# Patient Record
Sex: Female | Born: 1960
Health system: Southern US, Community
[De-identification: ages and names within clinical notes are randomized; demographics above are authoritative.]

## PROBLEM LIST (undated history)

## (undated) DIAGNOSIS — K219 Gastro-esophageal reflux disease without esophagitis: Secondary | ICD-10-CM

## (undated) DIAGNOSIS — E119 Type 2 diabetes mellitus without complications: Secondary | ICD-10-CM

## (undated) DIAGNOSIS — G4730 Sleep apnea, unspecified: Secondary | ICD-10-CM

## (undated) DIAGNOSIS — E785 Hyperlipidemia, unspecified: Secondary | ICD-10-CM

## (undated) DIAGNOSIS — J40 Bronchitis, not specified as acute or chronic: Secondary | ICD-10-CM

## (undated) DIAGNOSIS — F419 Anxiety disorder, unspecified: Secondary | ICD-10-CM

## (undated) HISTORY — DX: Bronchitis, not specified as acute or chronic: J40

## (undated) HISTORY — DX: Sleep apnea, unspecified: G47.30

## (undated) HISTORY — DX: Hyperlipidemia, unspecified: E78.5

## (undated) HISTORY — PX: ROTATOR CUFF REPAIR: SHX139

## (undated) HISTORY — DX: Anxiety disorder, unspecified: F41.9

## (undated) HISTORY — PX: BREAST EXCISIONAL BIOPSY: SUR124

## (undated) HISTORY — PX: COLPOSCOPY W/ BIOPSY / CURETTAGE: SUR283

## (undated) HISTORY — DX: Type 2 diabetes mellitus without complications: E11.9

---

## 1997-10-20 HISTORY — PX: TUBAL LIGATION: SHX77

## 1997-12-04 ENCOUNTER — Ambulatory Visit (HOSPITAL_COMMUNITY): Admission: RE | Admit: 1997-12-04 | Discharge: 1997-12-04 | Payer: Self-pay | Admitting: *Deleted

## 1998-09-20 ENCOUNTER — Ambulatory Visit (HOSPITAL_BASED_OUTPATIENT_CLINIC_OR_DEPARTMENT_OTHER): Admission: RE | Admit: 1998-09-20 | Discharge: 1998-09-20 | Payer: Self-pay | Admitting: Orthopedic Surgery

## 1999-10-21 HISTORY — PX: CARPAL TUNNEL RELEASE: SHX101

## 2000-12-04 ENCOUNTER — Other Ambulatory Visit: Admission: RE | Admit: 2000-12-04 | Discharge: 2000-12-04 | Payer: Self-pay | Admitting: Orthopedic Surgery

## 2002-06-24 ENCOUNTER — Emergency Department (HOSPITAL_COMMUNITY): Admission: EM | Admit: 2002-06-24 | Discharge: 2002-06-24 | Payer: Self-pay | Admitting: Emergency Medicine

## 2002-11-09 ENCOUNTER — Emergency Department (HOSPITAL_COMMUNITY): Admission: EM | Admit: 2002-11-09 | Discharge: 2002-11-09 | Payer: Self-pay | Admitting: Emergency Medicine

## 2003-04-12 ENCOUNTER — Emergency Department (HOSPITAL_COMMUNITY): Admission: EM | Admit: 2003-04-12 | Discharge: 2003-04-12 | Payer: Self-pay | Admitting: Emergency Medicine

## 2003-05-04 ENCOUNTER — Ambulatory Visit (HOSPITAL_BASED_OUTPATIENT_CLINIC_OR_DEPARTMENT_OTHER): Admission: RE | Admit: 2003-05-04 | Discharge: 2003-05-04 | Payer: Self-pay | Admitting: Orthopedic Surgery

## 2006-06-04 DIAGNOSIS — E785 Hyperlipidemia, unspecified: Secondary | ICD-10-CM | POA: Insufficient documentation

## 2006-06-04 DIAGNOSIS — E119 Type 2 diabetes mellitus without complications: Secondary | ICD-10-CM | POA: Insufficient documentation

## 2006-06-04 DIAGNOSIS — G4730 Sleep apnea, unspecified: Secondary | ICD-10-CM | POA: Insufficient documentation

## 2006-10-20 HISTORY — PX: DILATION AND CURETTAGE OF UTERUS: SHX78

## 2007-03-19 ENCOUNTER — Emergency Department: Payer: Self-pay | Admitting: Emergency Medicine

## 2007-05-17 ENCOUNTER — Ambulatory Visit: Payer: Self-pay

## 2007-05-19 DIAGNOSIS — N259 Disorder resulting from impaired renal tubular function, unspecified: Secondary | ICD-10-CM | POA: Insufficient documentation

## 2007-05-20 ENCOUNTER — Ambulatory Visit: Payer: Self-pay

## 2007-06-09 DIAGNOSIS — F17200 Nicotine dependence, unspecified, uncomplicated: Secondary | ICD-10-CM | POA: Insufficient documentation

## 2007-06-09 DIAGNOSIS — G4733 Obstructive sleep apnea (adult) (pediatric): Secondary | ICD-10-CM | POA: Insufficient documentation

## 2007-06-09 DIAGNOSIS — E7800 Pure hypercholesterolemia, unspecified: Secondary | ICD-10-CM | POA: Insufficient documentation

## 2007-11-13 ENCOUNTER — Emergency Department: Payer: Self-pay | Admitting: Emergency Medicine

## 2009-12-18 ENCOUNTER — Encounter: Payer: Self-pay | Admitting: Orthopedic Surgery

## 2010-05-03 DIAGNOSIS — E559 Vitamin D deficiency, unspecified: Secondary | ICD-10-CM | POA: Insufficient documentation

## 2011-07-14 ENCOUNTER — Emergency Department: Payer: Self-pay | Admitting: *Deleted

## 2011-09-25 ENCOUNTER — Emergency Department: Payer: Self-pay | Admitting: Emergency Medicine

## 2011-10-24 ENCOUNTER — Ambulatory Visit: Payer: Self-pay | Admitting: Urology

## 2011-11-11 ENCOUNTER — Ambulatory Visit: Payer: Self-pay | Admitting: Internal Medicine

## 2013-07-07 ENCOUNTER — Ambulatory Visit: Payer: Self-pay | Admitting: Otolaryngology

## 2013-08-31 ENCOUNTER — Other Ambulatory Visit: Payer: Self-pay | Admitting: Orthopedic Surgery

## 2013-08-31 DIAGNOSIS — IMO0002 Reserved for concepts with insufficient information to code with codable children: Secondary | ICD-10-CM

## 2013-09-05 ENCOUNTER — Ambulatory Visit
Admission: RE | Admit: 2013-09-05 | Discharge: 2013-09-05 | Disposition: A | Discharge status: Home / Self Care | Payer: BC Managed Care – PPO | Source: Ambulatory Visit | Attending: Orthopedic Surgery | Admitting: Orthopedic Surgery

## 2013-09-05 DIAGNOSIS — IMO0002 Reserved for concepts with insufficient information to code with codable children: Secondary | ICD-10-CM

## 2014-01-30 DIAGNOSIS — E669 Obesity, unspecified: Secondary | ICD-10-CM | POA: Insufficient documentation

## 2014-01-30 DIAGNOSIS — IMO0001 Reserved for inherently not codable concepts without codable children: Secondary | ICD-10-CM | POA: Insufficient documentation

## 2014-01-30 DIAGNOSIS — F17200 Nicotine dependence, unspecified, uncomplicated: Secondary | ICD-10-CM | POA: Insufficient documentation

## 2014-06-23 LAB — TSH: TSH: 0.71 u[IU]/mL (ref 0.41–5.90)

## 2014-06-23 LAB — BASIC METABOLIC PANEL
BUN: 5 mg/dL (ref 4–21)
CREATININE: 0.8 mg/dL (ref 0.5–1.1)
GLUCOSE: 129 mg/dL
Potassium: 4.9 mmol/L (ref 3.4–5.3)
Sodium: 143 mmol/L (ref 137–147)

## 2014-06-23 LAB — LIPID PANEL
CHOLESTEROL: 209 mg/dL — AB (ref 0–200)
HDL: 30 mg/dL — AB (ref 35–70)
LDL Cholesterol: 111 mg/dL
Triglycerides: 340 mg/dL — AB (ref 40–160)

## 2014-06-23 LAB — CBC AND DIFFERENTIAL
HCT: 42 % (ref 36–46)
HEMOGLOBIN: 14 g/dL (ref 12.0–16.0)
WBC: 7.3 10^3/mL

## 2014-06-23 LAB — HEPATIC FUNCTION PANEL
ALT: 9 U/L (ref 7–35)
AST: 14 U/L (ref 13–35)

## 2014-06-23 LAB — HEMOGLOBIN A1C: Hemoglobin A1C: 8.7

## 2014-10-20 HISTORY — PX: BREAST BIOPSY: SHX20

## 2014-10-26 LAB — HM PAP SMEAR: HM PAP: NORMAL

## 2014-11-06 ENCOUNTER — Ambulatory Visit: Payer: Self-pay | Admitting: Family Medicine

## 2014-11-14 ENCOUNTER — Ambulatory Visit: Payer: Self-pay | Admitting: Family Medicine

## 2014-11-20 ENCOUNTER — Ambulatory Visit: Payer: Self-pay | Admitting: Family Medicine

## 2015-02-12 LAB — SURGICAL PATHOLOGY

## 2015-05-13 ENCOUNTER — Other Ambulatory Visit: Payer: Self-pay | Admitting: Family Medicine

## 2015-05-13 DIAGNOSIS — E119 Type 2 diabetes mellitus without complications: Secondary | ICD-10-CM

## 2015-05-14 DIAGNOSIS — E119 Type 2 diabetes mellitus without complications: Secondary | ICD-10-CM | POA: Insufficient documentation

## 2015-05-14 NOTE — Telephone Encounter (Signed)
Last OV 10/2014  Thanks,   -Dawanna Grauberger  

## 2015-06-28 ENCOUNTER — Other Ambulatory Visit: Payer: Self-pay | Admitting: Family Medicine

## 2015-06-28 DIAGNOSIS — E119 Type 2 diabetes mellitus without complications: Secondary | ICD-10-CM

## 2015-06-29 NOTE — Telephone Encounter (Signed)
Last ov was on 10/26/2014. Januvia was refilled on 10/23/2014 and glipizide was refilled on 08/28/14.  Thanks,

## 2015-10-14 IMAGING — US US BIOPSY BREAST CORE W/ IMAGING
1 series · 12 of 12 positions shown · non-contrast
Comparison: Previous exam(s).

ADDENDUM:
Pathology of the left breast biopsy at 6 o'clock superficial
revealed PORTIONS OF SCLEROTIC / HYALINIZED FIBROADENOMA. This was
found to be concordant by Dr. Adlaho.

At the patient's request, pathology was relayed to the patient by
phone. She stated she has some soreness but has had no bleeding,
bruising, or hematoma at the biopsy site. Post biopsy instructions
were reviewed with the patient. She was encouraged to call the
[REDACTED] with any
further questions or concerns. The patient is to return in October 2015 for a bilateral screening mammogram.
Pathology relayed by Rosa Mesa, Denisyaredy on 11/24/14 at [id number].
CLINICAL DATA: Patient with indeterminate left breast mass, 6
o'clock position, superficial.
EXAM:
ULTRASOUND GUIDED LEFT BREAST CORE NEEDLE BIOPSY

[Series 1: us biopsy breast core w/ imaging · 0.08mm/px · 12 of 12 slices shown]
[im 1/12]
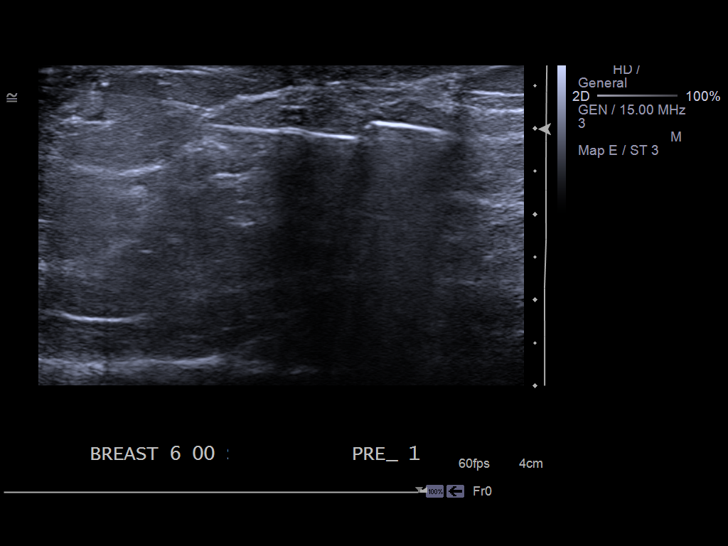
[im 2/12]
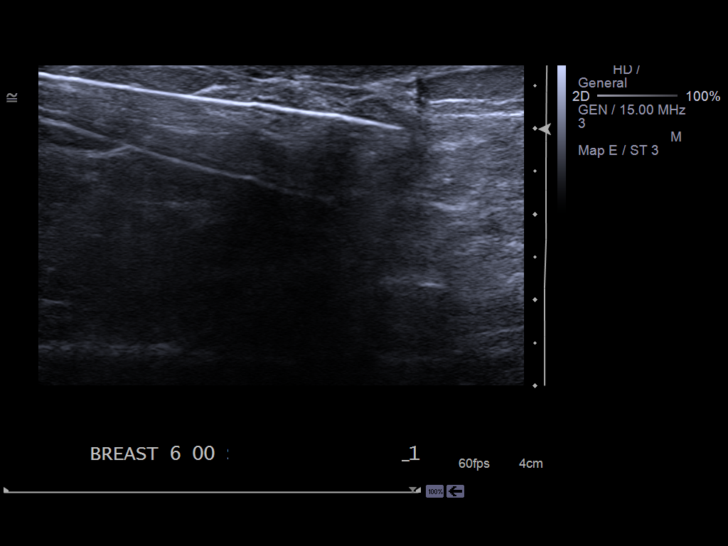
[im 3/12]
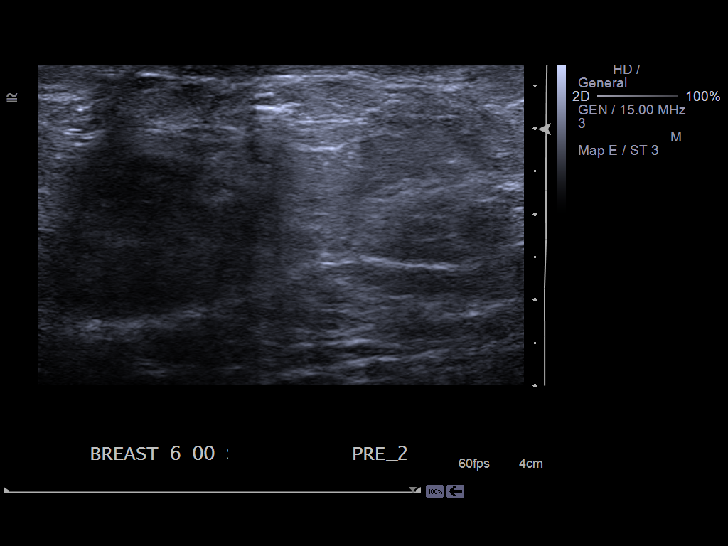
[im 4/12]
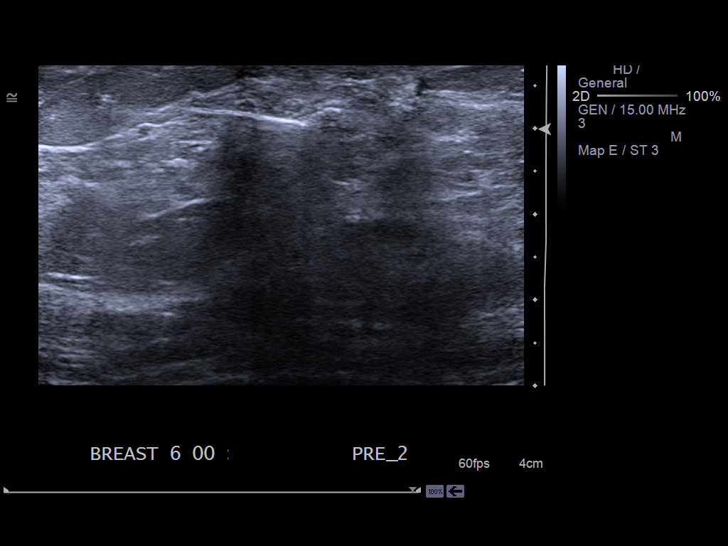
[im 5/12]
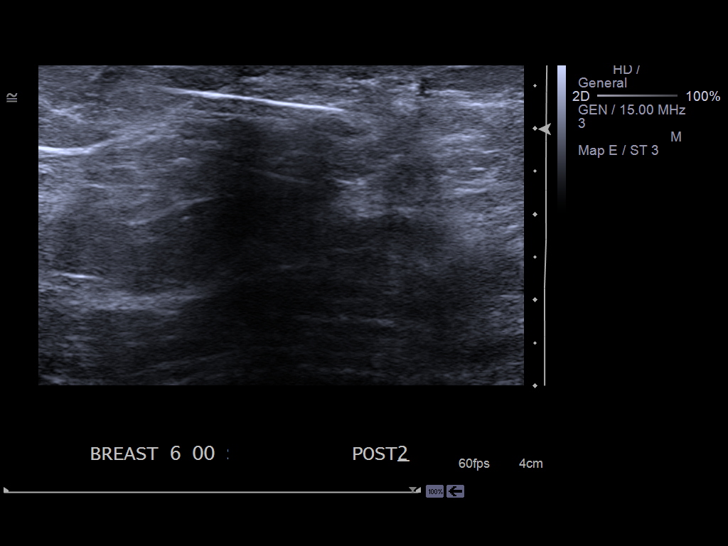
[im 6/12]
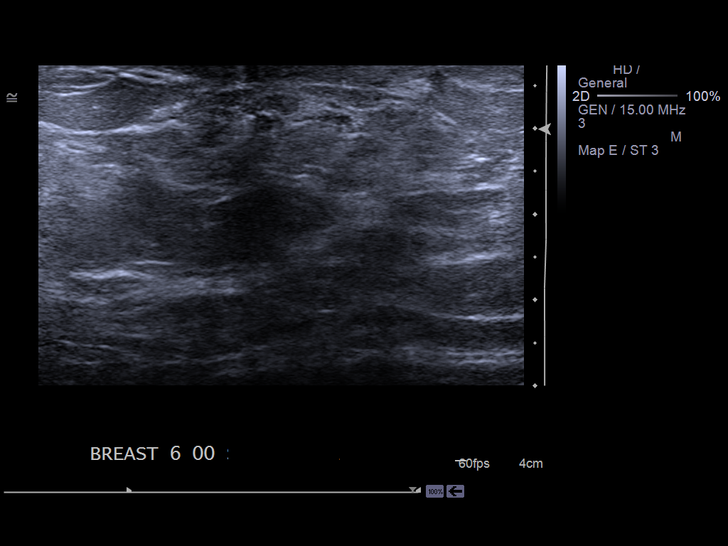
[im 7/12]
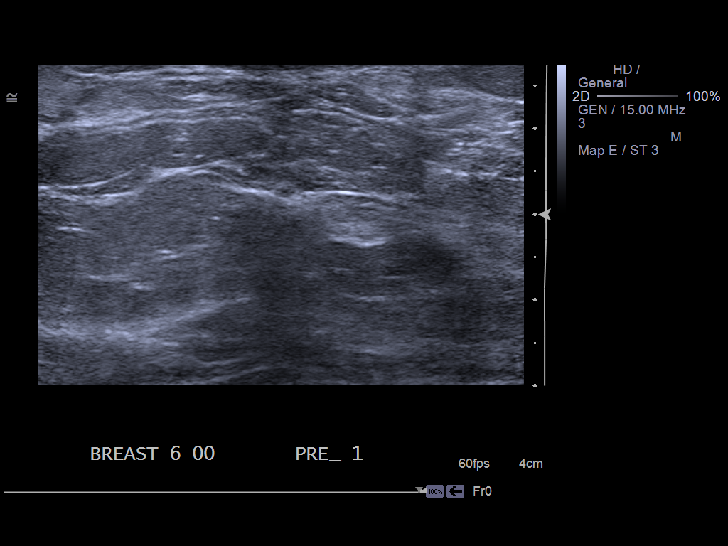
[im 8/12]
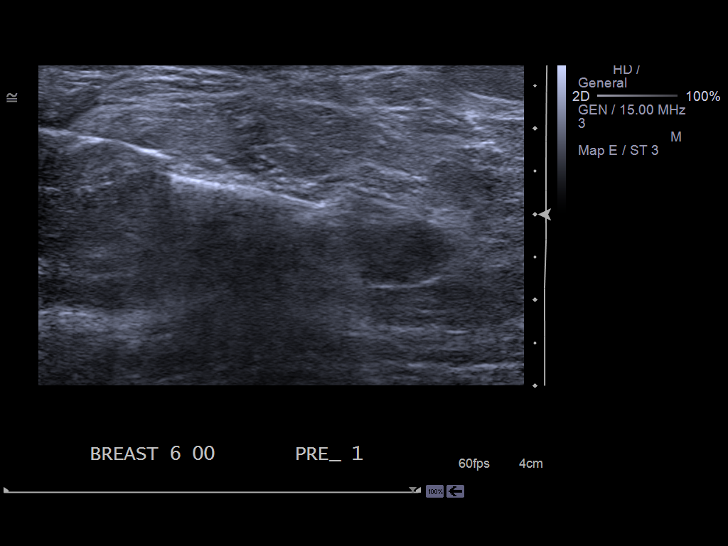
[im 9/12]
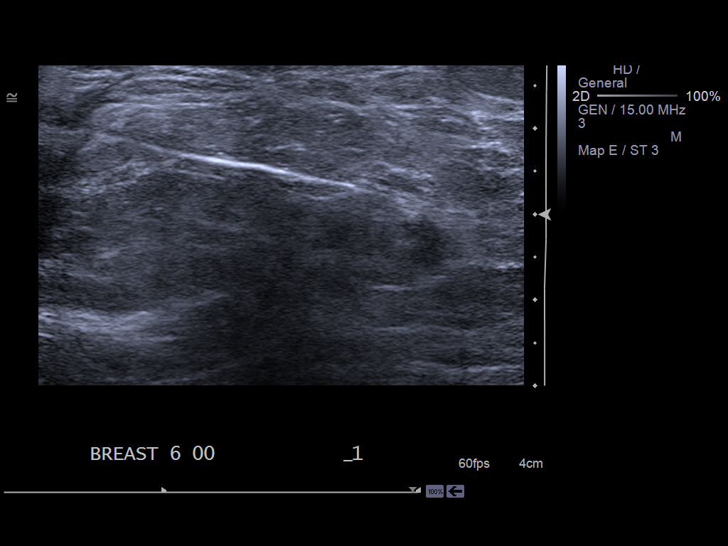
[im 10/12]
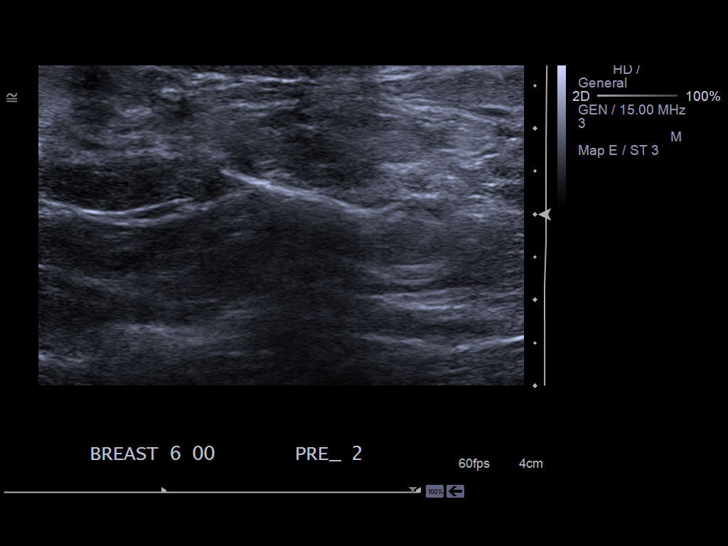
[im 11/12]
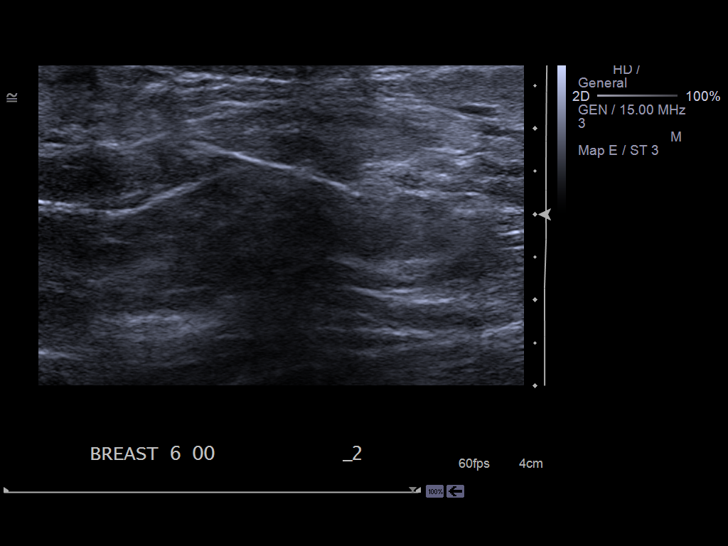
[im 12/12]
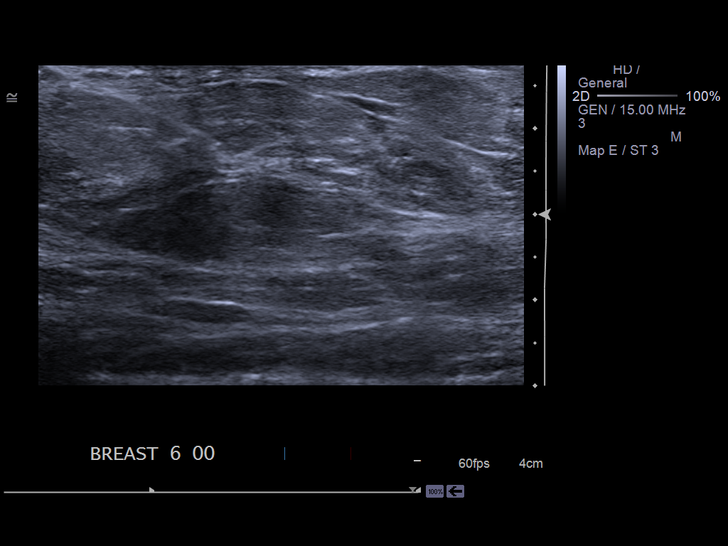

[12 of 12 positions shown; findings below may reference images not displayed]

PROCEDURE:
I met with the patient and we discussed the procedure of
ultrasound-guided biopsy, including benefits and alternatives. We
discussed the high likelihood of a successful procedure. We
discussed the risks of the procedure including infection, bleeding,
tissue injury, clip migration, and inadequate sampling. Informed
written consent was given. The usual time-out protocol was performed
immediately prior to the procedure.

Using sterile technique and 2% Lidocaine as local anesthetic, under
direct ultrasound visualization, a 12 gauge vacuum-assisted device
was used to perform biopsy of hypoechoic left breast mass 6 o'clock
position, superficialusing a lateral approach. At the conclusion of
the procedure, a coil shaped tissue marker clip was deployed into
the biopsy cavity. Follow-up 2-view mammogram was performed and
dictated separately.
IMPRESSION: Ultrasound-guided biopsy of left breast mass, superficial, #1. No
apparent complications.

## 2015-11-09 DIAGNOSIS — F419 Anxiety disorder, unspecified: Secondary | ICD-10-CM | POA: Insufficient documentation

## 2015-11-09 DIAGNOSIS — E1165 Type 2 diabetes mellitus with hyperglycemia: Secondary | ICD-10-CM | POA: Insufficient documentation

## 2015-11-09 DIAGNOSIS — R9431 Abnormal electrocardiogram [ECG] [EKG]: Secondary | ICD-10-CM | POA: Insufficient documentation

## 2015-11-09 DIAGNOSIS — R935 Abnormal findings on diagnostic imaging of other abdominal regions, including retroperitoneum: Secondary | ICD-10-CM | POA: Insufficient documentation

## 2015-11-12 ENCOUNTER — Encounter: Payer: Self-pay | Admitting: Physician Assistant

## 2015-11-12 ENCOUNTER — Ambulatory Visit (INDEPENDENT_AMBULATORY_CARE_PROVIDER_SITE_OTHER): Payer: Federal, State, Local not specified - PPO | Admitting: Physician Assistant

## 2015-11-12 VITALS — BP 134/70 | HR 66 | Temp 98.3°F | Resp 16 | Wt 166.0 lb

## 2015-11-12 DIAGNOSIS — E1165 Type 2 diabetes mellitus with hyperglycemia: Secondary | ICD-10-CM

## 2015-11-12 DIAGNOSIS — N259 Disorder resulting from impaired renal tubular function, unspecified: Secondary | ICD-10-CM | POA: Diagnosis not present

## 2015-11-12 DIAGNOSIS — I10 Essential (primary) hypertension: Secondary | ICD-10-CM | POA: Insufficient documentation

## 2015-11-12 DIAGNOSIS — E7800 Pure hypercholesterolemia, unspecified: Secondary | ICD-10-CM

## 2015-11-12 DIAGNOSIS — E119 Type 2 diabetes mellitus without complications: Secondary | ICD-10-CM | POA: Diagnosis not present

## 2015-11-12 LAB — POCT UA - MICROALBUMIN: MICROALBUMIN (UR) POC: 20 mg/L

## 2015-11-12 LAB — POCT GLYCOSYLATED HEMOGLOBIN (HGB A1C)
Est. average glucose Bld gHb Est-mCnc: 200
Hemoglobin A1C: 8.6

## 2015-11-12 NOTE — Patient Instructions (Signed)
Diabetes Mellitus and Food It is important for you to manage your blood sugar (glucose) level. Your blood glucose level can be greatly affected by what you eat. Eating healthier foods in the appropriate amounts throughout the day at about the same time each day will help you control your blood glucose level. It can also help slow or prevent worsening of your diabetes mellitus. Healthy eating may even help you improve the level of your blood pressure and reach or maintain a healthy weight.  General recommendations for healthful eating and cooking habits include:  Eating meals and snacks regularly. Avoid going long periods of time without eating to lose weight.  Eating a diet that consists mainly of plant-based foods, such as fruits, vegetables, nuts, legumes, and whole grains.  Using low-heat cooking methods, such as baking, instead of high-heat cooking methods, such as deep frying. Work with your dietitian to make sure you understand how to use the Nutrition Facts information on food labels. HOW CAN FOOD AFFECT ME? Carbohydrates Carbohydrates affect your blood glucose level more than any other type of food. Your dietitian will help you determine how many carbohydrates to eat at each meal and teach you how to count carbohydrates. Counting carbohydrates is important to keep your blood glucose at a healthy level, especially if you are using insulin or taking certain medicines for diabetes mellitus. Alcohol Alcohol can cause sudden decreases in blood glucose (hypoglycemia), especially if you use insulin or take certain medicines for diabetes mellitus. Hypoglycemia can be a life-threatening condition. Symptoms of hypoglycemia (sleepiness, dizziness, and disorientation) are similar to symptoms of having too much alcohol.  If your health care provider has given you approval to drink alcohol, do so in moderation and use the following guidelines:  Women should not have more than one drink per day, and men  should not have more than two drinks per day. One drink is equal to:  12 oz of beer.  5 oz of wine.  1 oz of hard liquor.  Do not drink on an empty stomach.  Keep yourself hydrated. Have water, diet soda, or unsweetened iced tea.  Regular soda, juice, and other mixers might contain a lot of carbohydrates and should be counted. WHAT FOODS ARE NOT RECOMMENDED? As you make food choices, it is important to remember that all foods are not the same. Some foods have fewer nutrients per serving than other foods, even though they might have the same number of calories or carbohydrates. It is difficult to get your body what it needs when you eat foods with fewer nutrients. Examples of foods that you should avoid that are high in calories and carbohydrates but low in nutrients include:  Trans fats (most processed foods list trans fats on the Nutrition Facts label).  Regular soda.  Juice.  Candy.  Sweets, such as cake, pie, doughnuts, and cookies.  Fried foods. WHAT FOODS CAN I EAT? Eat nutrient-rich foods, which will nourish your body and keep you healthy. The food you should eat also will depend on several factors, including:  The calories you need.  The medicines you take.  Your weight.  Your blood glucose level.  Your blood pressure level.  Your cholesterol level. You should eat a variety of foods, including:  Protein.  Lean cuts of meat.  Proteins low in saturated fats, such as fish, egg whites, and beans. Avoid processed meats.  Fruits and vegetables.  Fruits and vegetables that may help control blood glucose levels, such as apples, mangoes, and   yams.  Dairy products.  Choose fat-free or low-fat dairy products, such as milk, yogurt, and cheese.  Grains, bread, pasta, and rice.  Choose whole grain products, such as multigrain bread, whole oats, and brown rice. These foods may help control blood pressure.  Fats.  Foods containing healthful fats, such as nuts,  avocado, olive oil, canola oil, and fish. DOES EVERYONE WITH DIABETES MELLITUS HAVE THE SAME MEAL PLAN? Because every person with diabetes mellitus is different, there is not one meal plan that works for everyone. It is very important that you meet with a dietitian who will help you create a meal plan that is just right for you.   This information is not intended to replace advice given to you by your health care provider. Make sure you discuss any questions you have with your health care provider.   Document Released: 07/03/2005 Document Revised: 10/27/2014 Document Reviewed: 09/02/2013 Elsevier Interactive Patient Education 2016 Elsevier Inc.  

## 2015-11-12 NOTE — Progress Notes (Signed)
Patient: Jacqueline Sparks Female    DOB: [birth year]   55 y.o.   MRN: [id number] Visit Date: 11/12/2015  Today's Provider: Mar Daring, PA-C   Chief Complaint  Patient presents with  . Diabetes   Subjective:    HPI  Diabetes Mellitus Type II, Follow-up:   Lab Results  Component Value Date   HGBA1C 8.7 06/23/2014    Last seen for diabetes 1 years ago.  Management since then includes none. She reports fair compliance with treatment. She is not having side effects.  Current symptoms include polydipsia, polyuria and visual disturbances and have been unchanged. Home blood sugar records:Generally she checks while fasting and ranges are between 100-120.  Episodes of hypoglycemia? No, when she first started the [place] it did.   Current Insulin Regimen: Actos,Farxiga,Glipizide,Januvia. Patient saw the Endocrinologist, 05/2015 and was put on the Farxiga. Most Recent Eye Exam: Per patient was 3 years ago. Weight trend: stable Prior visit with dietician: yes - it has been a while. Current diet: in general, a "healthy" diet   Current exercise: housecleaning and walking  Pertinent Labs:    Component Value Date/Time   CHOL 209* 06/23/2014   TRIG 340* 06/23/2014   HDL 30* 06/23/2014   LDLCALC 111 06/23/2014   CREATININE 0.8 06/23/2014    Wt Readings from Last 3 Encounters:  11/12/15 166 lb (75.297 kg)  10/26/14 169 lb (76.658 kg)    ------------------------------------------------------------------------      No Known Allergies Previous Medications   ALPRAZOLAM (XANAX) 0.5 MG TABLET    Take by mouth.   ATORVASTATIN (LIPITOR) 10 MG TABLET    Take by mouth.   CLOBETASOL OINTMENT (TEMOVATE) 0.05 %       DAPAGLIFLOZIN PROPANEDIOL (FARXIGA) 10 MG TABS TABLET    Take by mouth.   GLIPIZIDE (GLUCOTROL) 10 MG TABLET    TAKE ONE TABLET BY MOUTH TWICE DAILY 30 MINUTES BEFORE A MEAL   JANUVIA 100 MG TABLET    TAKE ONE TABLET BY MOUTH ONCE DAILY   NON FORMULARY    CPAP    ONE TOUCH ULTRA TEST TEST STRIP       PIOGLITAZONE (ACTOS) 45 MG TABLET    TAKE ONE TABLET BY MOUTH ONCE DAILY    Review of Systems  Constitutional: Negative for fatigue and unexpected weight change.  HENT: Negative.   Eyes: Positive for visual disturbance.  Respiratory: Negative.   Cardiovascular: Negative for chest pain, palpitations and leg swelling.  Gastrointestinal: Negative.   Endocrine: Positive for polydipsia (feel it comes from the Pepsi she drinks.).  Genitourinary: Negative for urgency and frequency.  Musculoskeletal: Negative.   Allergic/Immunologic: Negative.   Neurological: Negative for dizziness, light-headedness and headaches.  Hematological: Negative.   Psychiatric/Behavioral: Negative.     Social History  Substance Use Topics  . Smoking status: Current Every Day Smoker -- 1.00 packs/day    Types: Cigarettes  . Smokeless tobacco: Never Used  . Alcohol Use: No   Objective:   BP 134/70 mmHg  Pulse 66  Temp(Src) 98.3 F (36.8 C) (Oral)  Resp 16  Wt 166 lb (75.297 kg)  Physical Exam  Constitutional: She is oriented to person, place, and time. She appears well-developed and well-nourished. No distress.  HENT:  Head: Normocephalic and atraumatic.  Right Ear: External ear normal.  Left Ear: External ear normal.  Nose: Nose normal.  Mouth/Throat: Oropharynx is clear and moist. No oropharyngeal exudate.  Eyes: Conjunctivae and EOM are normal. Pupils are  equal, round, and reactive to light. Right eye exhibits no discharge. Left eye exhibits no discharge. No scleral icterus.  Neck: Normal range of motion. Neck supple. No JVD present. No tracheal deviation present. No thyromegaly present.  Cardiovascular: Normal rate, regular rhythm, normal heart sounds and intact distal pulses.  Exam reveals no gallop and no friction rub.   No murmur heard. Pulmonary/Chest: Effort normal and breath sounds normal. No respiratory distress. She has no wheezes. She has no rales.  She exhibits no tenderness.  Abdominal: Soft. Bowel sounds are normal. She exhibits no distension and no mass. There is no tenderness. There is no rebound and no guarding.  Musculoskeletal: Normal range of motion. She exhibits no edema or tenderness.  Lymphadenopathy:    She has no cervical adenopathy.  Neurological: She is alert and oriented to person, place, and time.  Skin: Skin is warm and dry. No rash noted. She is not diaphoretic.  Psychiatric: She has a normal mood and affect. Her behavior is normal. Judgment and thought content normal.  Vitals reviewed.       Assessment & Plan:     1. Type 2 diabetes mellitus with hyperglycemia, unspecified long term insulin use status (HCC) Microalbumin was 20 today and hemoglobin A1c was 8.6 which both are stable. She is currently stable on Farxiga 10 mg, glipizide 10 mg, Januvia 100 mg, and Actos 45 mg. She has recently seen an endocrinologist in August 2016 and she cannot remember if she was scheduled to follow-up or not. Foot exam today was within normal limits. She does also follow dermatology for psoriasis on her feet and hands. I did also advise her to follow-up with her ophthalmologist as she has not seen him in approximately 2-3 years. She is having visual changes and states that increased blurry vision. I will also be checking labs as below. I will follow-up with her pending these lab results. I did also discuss with her about trying to cut back on the Pepsi: That she drinks. She states she drinks 1 L of Pepsi-Cola daily. We did discuss that if she could discontinue drinking Pepsi-Cola that she may be able to come off some of her diabetic medications. If labs are stable and within normal limits I will see her back in 3 months to reevaluate. She is to call the office in the meantime if she develops any acute issues, questions or concerns. - POCT UA - Microalbumin - POCT glycosylated hemoglobin (Hb A1C) - CBC With Differential - Comprehensive  Metabolic Panel (CMET) - TSH  2. Renal tubular disorder History of this. I will check complete metabolic panel as below to see what her kidney function is currently. I will follow-up with her pending lab results. - Comprehensive Metabolic Panel (CMET)  4. Hypercholesteremia Currently stable on atorvastatin 10 mg. I will check cholesterol as below and follow-up pending results. In the meantime she is to continue her current medical treatment of atorvastatin 10 mg daily. - Lipid Profile       Mar Daring, PA-C  [facility]

## 2015-11-20 LAB — CBC WITH DIFFERENTIAL
BASOS: 0 %
Basophils Absolute: 0 10*3/uL (ref 0.0–0.2)
EOS (ABSOLUTE): 0.2 10*3/uL (ref 0.0–0.4)
EOS: 2 %
HEMATOCRIT: 40.6 % (ref 34.0–46.6)
Hemoglobin: 13.7 g/dL (ref 11.1–15.9)
IMMATURE GRANS (ABS): 0 10*3/uL (ref 0.0–0.1)
Immature Granulocytes: 0 %
LYMPHS: 47 %
Lymphocytes Absolute: 4.5 10*3/uL — ABNORMAL HIGH (ref 0.7–3.1)
MCH: 27.8 pg (ref 26.6–33.0)
MCHC: 33.7 g/dL (ref 31.5–35.7)
MCV: 82 fL (ref 79–97)
MONOCYTES: 6 %
Monocytes Absolute: 0.5 10*3/uL (ref 0.1–0.9)
NEUTROS PCT: 45 %
Neutrophils Absolute: 4.3 10*3/uL (ref 1.4–7.0)
RBC: 4.93 x10E6/uL (ref 3.77–5.28)
RDW: 15.2 % (ref 12.3–15.4)
WBC: 9.6 10*3/uL (ref 3.4–10.8)

## 2015-11-20 LAB — COMPREHENSIVE METABOLIC PANEL
A/G RATIO: 1.4 (ref 1.1–2.5)
ALT: 10 IU/L (ref 0–32)
AST: 15 IU/L (ref 0–40)
Albumin: 4.2 g/dL (ref 3.5–5.5)
Alkaline Phosphatase: 85 IU/L (ref 39–117)
BUN/Creatinine Ratio: 5 — ABNORMAL LOW (ref 9–23)
BUN: 4 mg/dL — ABNORMAL LOW (ref 6–24)
Bilirubin Total: <0.2 mg/dL (ref 0.0–1.2)
CALCIUM: 9.6 mg/dL (ref 8.7–10.2)
CO2: 25 mmol/L (ref 18–29)
Chloride: 102 mmol/L (ref 96–106)
Creatinine, Ser: 0.81 mg/dL (ref 0.57–1.00)
GFR, EST AFRICAN AMERICAN: 97 mL/min/{1.73_m2} (ref >59)
GFR, EST NON AFRICAN AMERICAN: 84 mL/min/{1.73_m2} (ref >59)
GLOBULIN, TOTAL: 3.1 g/dL (ref 1.5–4.5)
Glucose: 113 mg/dL — ABNORMAL HIGH (ref 65–99)
POTASSIUM: 4.9 mmol/L (ref 3.5–5.2)
Sodium: 146 mmol/L — ABNORMAL HIGH (ref 134–144)
TOTAL PROTEIN: 7.3 g/dL (ref 6.0–8.5)

## 2015-11-20 LAB — LIPID PANEL
CHOL/HDL RATIO: 8.7 ratio — AB (ref 0.0–4.4)
Cholesterol, Total: 253 mg/dL — ABNORMAL HIGH (ref 100–199)
HDL: 29 mg/dL — AB (ref >39)
Triglycerides: 469 mg/dL — ABNORMAL HIGH (ref 0–149)

## 2015-11-20 LAB — TSH: TSH: 1.19 u[IU]/mL (ref 0.450–4.500)

## 2015-11-20 MED ORDER — ATORVASTATIN CALCIUM 20 MG PO TABS: 20.0000 mg | ORAL_TABLET | Freq: Every day | ORAL | Status: DC

## 2015-11-20 NOTE — Addendum Note (Signed)
Addended by: Mar Daring on: 11/20/2015 09:22 AM   Modules accepted: Orders

## 2015-12-03 ENCOUNTER — Other Ambulatory Visit: Payer: Self-pay | Admitting: Family Medicine

## 2015-12-03 DIAGNOSIS — F419 Anxiety disorder, unspecified: Secondary | ICD-10-CM

## 2015-12-03 NOTE — Telephone Encounter (Signed)
Prescription  For Alprazolam 0.5 was called to [facility]  Thanks,  -Joseline

## 2016-02-04 DIAGNOSIS — H01003 Unspecified blepharitis right eye, unspecified eyelid: Secondary | ICD-10-CM | POA: Diagnosis not present

## 2016-02-11 ENCOUNTER — Encounter: Payer: Self-pay | Admitting: Physician Assistant

## 2016-02-11 ENCOUNTER — Ambulatory Visit (INDEPENDENT_AMBULATORY_CARE_PROVIDER_SITE_OTHER): Payer: Federal, State, Local not specified - PPO | Admitting: Physician Assistant

## 2016-02-11 ENCOUNTER — Other Ambulatory Visit: Payer: Self-pay | Admitting: Family Medicine

## 2016-02-11 VITALS — BP 150/88 | HR 77 | Temp 98.3°F | Resp 16 | Wt 169.2 lb

## 2016-02-11 DIAGNOSIS — E119 Type 2 diabetes mellitus without complications: Secondary | ICD-10-CM | POA: Diagnosis not present

## 2016-02-11 DIAGNOSIS — M7542 Impingement syndrome of left shoulder: Secondary | ICD-10-CM | POA: Diagnosis not present

## 2016-02-11 DIAGNOSIS — R030 Elevated blood-pressure reading, without diagnosis of hypertension: Secondary | ICD-10-CM

## 2016-02-11 DIAGNOSIS — H1089 Other conjunctivitis: Secondary | ICD-10-CM

## 2016-02-11 DIAGNOSIS — M7582 Other shoulder lesions, left shoulder: Secondary | ICD-10-CM | POA: Diagnosis not present

## 2016-02-11 DIAGNOSIS — A499 Bacterial infection, unspecified: Secondary | ICD-10-CM | POA: Diagnosis not present

## 2016-02-11 DIAGNOSIS — M7522 Bicipital tendinitis, left shoulder: Secondary | ICD-10-CM

## 2016-02-11 DIAGNOSIS — H109 Unspecified conjunctivitis: Secondary | ICD-10-CM

## 2016-02-11 DIAGNOSIS — IMO0001 Reserved for inherently not codable concepts without codable children: Secondary | ICD-10-CM

## 2016-02-11 LAB — POCT GLYCOSYLATED HEMOGLOBIN (HGB A1C): HEMOGLOBIN A1C: 8.6

## 2016-02-11 MED ORDER — NEOMYCIN-POLYMYXIN-HC 3.5-10000-1 OP SUSP: 4.0000 [drp] | Freq: Three times a day (TID) | OPHTHALMIC | Status: DC

## 2016-02-11 NOTE — Patient Instructions (Signed)
Basic Carbohydrate Counting for Diabetes Mellitus Carbohydrate counting is a method for keeping track of the amount of carbohydrates you eat. Eating carbohydrates naturally increases the level of sugar (glucose) in your blood, so it is important for you to know the amount that is okay for you to have in every meal. Carbohydrate counting helps keep the level of glucose in your blood within normal limits. The amount of carbohydrates allowed is different for every person. A dietitian can help you calculate the amount that is right for you. Once you know the amount of carbohydrates you can have, you can count the carbohydrates in the foods you want to eat. Carbohydrates are found in the following foods:  Grains, such as breads and cereals.  Dried beans and soy products.  Starchy vegetables, such as potatoes, peas, and corn.  Fruit and fruit juices.  Milk and yogurt.  Sweets and snack foods, such as cake, cookies, candy, chips, soft drinks, and fruit drinks. CARBOHYDRATE COUNTING There are two ways to count the carbohydrates in your food. You can use either of the methods or a combination of both. Reading the "Nutrition Facts" on Packaged Food The "Nutrition Facts" is an area that is included on the labels of almost all packaged food and beverages in the [place]. It includes the serving size of that food or beverage and information about the nutrients in each serving of the food, including the grams (g) of carbohydrate per serving.  Decide the number of servings of this food or beverage that you will be able to eat or drink. Multiply that number of servings by the number of grams of carbohydrate that is listed on the label for that serving. The total will be the amount of carbohydrates you will be having when you eat or drink this food or beverage. Learning Standard Serving Sizes of Food When you eat food that is not packaged or does not include "Nutrition Facts" on the label, you need to  measure the servings in order to count the amount of carbohydrates.A serving of most carbohydrate-rich foods contains about 15 g of carbohydrates. The following list includes serving sizes of carbohydrate-rich foods that provide 15 g ofcarbohydrate per serving:   1 slice of bread (1 oz) or 1 six-inch tortilla.    of a hamburger bun or English muffin.  4-6 crackers.   cup unsweetened dry cereal.    cup hot cereal.   cup rice or pasta.    cup mashed potatoes or  of a large baked potato.  1 cup fresh fruit or one small piece of fruit.    cup canned or frozen fruit or fruit juice.  1 cup milk.   cup plain fat-free yogurt or yogurt sweetened with artificial sweeteners.   cup cooked dried beans or starchy vegetable, such as peas, corn, or potatoes.  Decide the number of standard-size servings that you will eat. Multiply that number of servings by 15 (the grams of carbohydrates in that serving). For example, if you eat 2 cups of strawberries, you will have eaten 2 servings and 30 g of carbohydrates (2 servings x 15 g = 30 g). For foods such as soups and casseroles, in which more than one food is mixed in, you will need to count the carbohydrates in each food that is included. EXAMPLE OF CARBOHYDRATE COUNTING Sample Dinner  3 oz chicken breast.   cup of brown rice.   cup of corn.  1 cup milk.   1 cup strawberries with   sugar-free whipped topping.  Carbohydrate Calculation Step 1: Identify the foods that contain carbohydrates:   Rice.   Corn.   Milk.   Strawberries. Step 2:Calculate the number of servings eaten of each:   2 servings of rice.   1 serving of corn.   1 serving of milk.   1 serving of strawberries. Step 3: Multiply each of those number of servings by 15 g:   2 servings of rice x 15 g = 30 g.   1 serving of corn x 15 g = 15 g.   1 serving of milk x 15 g = 15 g.   1 serving of strawberries x 15 g = 15 g. Step 4: Add  together all of the amounts to find the total grams of carbohydrates eaten: 30 g + 15 g + 15 g + 15 g = 75 g.   This information is not intended to replace advice given to you by your health care provider. Make sure you discuss any questions you have with your health care provider.   Document Released: 10/06/2005 Document Revised: 10/27/2014 Document Reviewed: 09/02/2013 Elsevier Interactive Patient Education 2016 Elsevier Inc. Bacterial Conjunctivitis Bacterial conjunctivitis, commonly called pink eye, is an inflammation of the clear membrane that covers the white part of the eye (conjunctiva). The inflammation can also happen on the underside of the eyelids. The blood vessels in the conjunctiva become inflamed, causing the eye to become red or pink. Bacterial conjunctivitis may spread easily from one eye to another and from person to person (contagious).  CAUSES  Bacterial conjunctivitis is caused by bacteria. The bacteria may come from your own skin, your upper respiratory tract, or from someone else with bacterial conjunctivitis. SYMPTOMS  The normally white color of the eye or the underside of the eyelid is usually pink or red. The pink eye is usually associated with irritation, tearing, and some sensitivity to light. Bacterial conjunctivitis is often associated with a thick, yellowish discharge from the eye. The discharge may turn into a crust on the eyelids overnight, which causes your eyelids to stick together. If a discharge is present, there may also be some blurred vision in the affected eye. DIAGNOSIS  Bacterial conjunctivitis is diagnosed by your caregiver through an eye exam and the symptoms that you report. Your caregiver looks for changes in the surface tissues of your eyes, which may point to the specific type of conjunctivitis. A sample of any discharge may be collected on a cotton-tip swab if you have a severe case of conjunctivitis, if your cornea is affected, or if you keep getting  repeat infections that do not respond to treatment. The sample will be sent to a lab to see if the inflammation is caused by a bacterial infection and to see if the infection will respond to antibiotic medicines. TREATMENT   Bacterial conjunctivitis is treated with antibiotics. Antibiotic eyedrops are most often used. However, antibiotic ointments are also available. Antibiotics pills are sometimes used. Artificial tears or eye washes may ease discomfort. HOME CARE INSTRUCTIONS   To ease discomfort, apply a cool, clean washcloth to your eye for 10-20 minutes, 3-4 times a day.  Gently wipe away any drainage from your eye with a warm, wet washcloth or a cotton ball.  Wash your hands often with soap and water. Use paper towels to dry your hands.  Do not share towels or washcloths. This may spread the infection.  Change or wash your pillowcase every day.  You should not use  eye makeup until the infection is gone.  Do not operate machinery or drive if your vision is blurred.  Stop using contact lenses. Ask your caregiver how to sterilize or replace your contacts before using them again. This depends on the type of contact lenses that you use.  When applying medicine to the infected eye, do not touch the edge of your eyelid with the eyedrop bottle or ointment tube. SEEK IMMEDIATE MEDICAL CARE IF:   Your infection has not improved within 3 days after beginning treatment.  You had yellow discharge from your eye and it returns.  You have increased eye pain.  Your eye redness is spreading.  Your vision becomes blurred.  You have a fever or persistent symptoms for more than 2-3 days.  You have a fever and your symptoms suddenly get worse.  You have facial pain, redness, or swelling. MAKE SURE YOU:   Understand these instructions.  Will watch your condition.  Will get help right away if you are not doing well or get worse.   This information is not intended to replace advice given  to you by your health care provider. Make sure you discuss any questions you have with your health care provider.   Document Released: 10/06/2005 Document Revised: 10/27/2014 Document Reviewed: 03/08/2012 Elsevier Interactive Patient Education [employer].

## 2016-02-11 NOTE — Progress Notes (Addendum)
Patient: Jacqueline Sparks Female    DOB: Aug 07, 1961   55 y.o.   MRN: [id number] Visit Date: 02/11/2016  Today's Provider: Mar Daring, PA-C   Chief Complaint  Patient presents with  . Follow-up    Diabetes   Subjective:    HPI   Diabetes Mellitus Type II, Follow-up:   Lab Results  Component Value Date   HGBA1C 8.6 02/11/2016   HGBA1C 8.6 11/12/2015   HGBA1C 8.7 06/23/2014    Last seen for diabetes 3 months ago.  Management since then includes none. Stable. She reports good compliance with treatment, except for over the last 3 weeks she has not refilled her medications and has been out of them.. She is not having side effects. Current symptoms include none and have been stable. Home blood sugar records: fasting range: 130-140 for the past two weeks she has not being checking because she run out of strips also.  Episodes of hypoglycemia? Yes. She reports that it drops to 90 usually around 10 am but thinks is because she doesn't eat breakfast.   Current Insulin Regimen:Farxiga 10 MG, Glipizide 10 Mg, Januvia 100 MG and Actos 45 Mg. Most Recent Eye Exam: In the last office visit patient was advised to follow-up with the Opthalmologist, since she was having visual changes and increased blurry vision. Per patient she made an appointment and was seen last week but she was having acute issue that was addressed. She is due to go in May for her dilated eye exam. She is however still having similar issues like when she went to the ophthalmologist last week including swollen eye lid on the right and itchy, red eye with drainage on the left. She states when she saw him the redness and drainage were on the right. Both eyes improved and then the itching, redness and drainage returned on the left. Has foreign body sensation. Weight trend: stable Prior visit with dietician: no Current diet: in general, a "healthy" diet   Current exercise: walking  Pertinent Labs:    Component  Value Date/Time   CHOL 253* 11/19/2015 0810   CHOL 209* 06/23/2014   TRIG 469* 11/19/2015 0810   HDL 29* 11/19/2015 0810   HDL 30* 06/23/2014   LDLCALC Comment 11/19/2015 0810   LDLCALC 111 06/23/2014   CREATININE 0.81 11/19/2015 0810   CREATININE 0.8 06/23/2014    Wt Readings from Last 3 Encounters:  02/11/16 169 lb 3.2 oz (76.749 kg)  11/12/15 166 lb (75.297 kg)  10/26/14 169 lb (76.658 kg)   ------------------------------------------------------------------------ She also has increasing complaint of worsening pain in the left shoulder. She is noticing pain with lifting and raising her arm above her head. 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 She has had rotator cuff repair on the right from similar mechanism. No radiating pain, numbness or tingling currently.    No Known Allergies Previous Medications   ALPRAZOLAM (XANAX) 0.5 MG TABLET    TAKE ONE-HALF TO ONE TABLET BY MOUTH TWICE DAILY AS NEEDED   ATORVASTATIN (LIPITOR) 20 MG TABLET    Take 1 tablet (20 mg total) by mouth daily.   CLOBETASOL OINTMENT (TEMOVATE) 0.05 %       DAPAGLIFLOZIN PROPANEDIOL (FARXIGA) 10 MG TABS TABLET    Take by mouth. Reported on 02/11/2016   GLIPIZIDE (GLUCOTROL) 10 MG TABLET    TAKE ONE TABLET BY MOUTH TWICE DAILY 30 MINUTES BEFORE A MEAL  JANUVIA 100 MG TABLET    TAKE ONE TABLET BY MOUTH ONCE DAILY   NON FORMULARY    CPAP   PIOGLITAZONE (ACTOS) 45 MG TABLET    TAKE ONE TABLET BY MOUTH ONCE DAILY    Review of Systems  Constitutional: Positive for fatigue. Negative for fever and chills.  HENT: Negative.   Eyes: Positive for discharge, redness and itching.       For the past two weeks  Respiratory: Positive for cough. Negative for chest tightness, shortness of breath and wheezing.   Cardiovascular: Negative for chest pain, palpitations and leg swelling.  Gastrointestinal: Negative for nausea, vomiting and abdominal pain.  Endocrine: Positive  for polydipsia and polyuria. Negative for polyphagia.  Genitourinary: Positive for frequency. Negative for dysuria, urgency and hematuria.  Musculoskeletal: Positive for arthralgias (left shoulder).  Skin: Negative.   Allergic/Immunologic: Negative.   Neurological: Negative for dizziness, light-headedness and headaches.  Hematological: Negative.   Psychiatric/Behavioral: Negative.     Social History  Substance Use Topics  . Smoking status: Current Every Day Smoker -- 1.00 packs/day    Types: Cigarettes  . Smokeless tobacco: Never Used  . Alcohol Use: No   Objective:   BP 150/88 mmHg  Pulse 77  Temp(Src) 98.3 F (36.8 C) (Oral)  Resp 16  Wt 169 lb 3.2 oz (76.749 kg)  Physical Exam  Constitutional: She is oriented to person, place, and time. She appears well-developed and well-nourished. No distress.  HENT:  Head: Normocephalic and atraumatic.  Right Ear: Tympanic membrane, external ear and ear canal normal.  Left Ear: Tympanic membrane, external ear and ear canal normal.  Nose: Nose normal.  Mouth/Throat: Uvula is midline, oropharynx is clear and moist and mucous membranes are normal. No oropharyngeal exudate, posterior oropharyngeal edema or posterior oropharyngeal erythema.  Eyes: EOM are normal. Pupils are equal, round, and reactive to light. Right eye exhibits hordeolum. Right eye exhibits no chemosis, no discharge and no exudate. No foreign body present in the right eye. Left eye exhibits no chemosis, no discharge, no exudate and no hordeolum. No foreign body present in the left eye. Right conjunctiva is not injected. Left conjunctiva is injected. No scleral icterus.  Neck: Normal range of motion. Neck supple.  Cardiovascular: Normal rate, regular rhythm, normal heart sounds and intact distal pulses.  Exam reveals no gallop and no friction rub.   No murmur heard. Pulmonary/Chest: Effort normal and breath sounds normal. No respiratory distress. She has no wheezes. She has no  rales.  Abdominal: Soft. Bowel sounds are normal. She exhibits no distension and no mass. There is no tenderness. There is no rebound and no guarding.  Musculoskeletal:       Right shoulder: Normal.       Left shoulder: She exhibits tenderness (over biceps tendon). She exhibits normal range of motion, no bony tenderness, no swelling, no effusion, no crepitus, no deformity, no laceration, no pain, no spasm, normal pulse and normal strength.  ROM was not limited but did have discomfort with abduction, shoulder flexion and horizontal adduction.  Neurological: She is alert and oriented to person, place, and time.  Skin: Skin is warm and dry. No rash noted. She is not diaphoretic.  Vitals reviewed.       Assessment & Plan:     1. Type 2 diabetes mellitus without complication, without long-term current use of insulin (HCC) HgBA1c was stable today even though she had not been taking her medications for 3 weeks. Discussed importance of taking medications  that are prescribed. Advised this may be why she has been having increased urinary frequency. I will see her back in 3 months for her T2DM recheck as well as cholesterol and BP. She is to call the office if she has any acute issue, question or concern in the meantime.  - POCT glycosylated hemoglobin (Hb A1C)  2. Bacterial conjunctivitis of left eye Worsening and recurrent conjunctivitis of the left eye. Hordeolum noted on right upper lid. Advised to continue with warm compresses. Cortisporin drops given as below for bacterial conjunctivitis of the left eye. If no improvement or worsening symptoms she is to call her ophthalmologist. - neomycin-polymyxin-hydrocortisone (CORTISPORIN) 3.5-10000-1 ophthalmic suspension; Place 4 drops into the left eye 3 (three) times daily.  Dispense: 7.5 mL; Refill: 0  3. Elevated blood pressure BP is elevated on today's office visit. She is not currently on any anti-hypertensives due to history of renal tubular  disorder. If BP still elevated at return visit will add CCB for better control.   4. 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 Has previously had rotator cuff repair on right shoulder and required arthroscopic repair. Advised to continue heating and stretches. May take IBU or aleve. Advised if using IBU may take 3 tabs 3 times per day, if aleve may take 2 tabs twice daily. Take with food. She is to call if symptoms worsen. Will recheck in 3 months. Discussed adding PT if needed, but she does not wish for that at this time. Will continue conservative therapy.   5. Rotator cuff tendinitis, left See above medical treatment plan.  6. Impingement syndrome of left shoulder See above medical treatment plan.       Mar Daring, PA-C  [facility]

## 2016-02-13 ENCOUNTER — Telehealth: Payer: Self-pay | Admitting: Physician Assistant

## 2016-02-13 ENCOUNTER — Encounter: Payer: Self-pay | Admitting: Physician Assistant

## 2016-02-13 NOTE — Telephone Encounter (Signed)
Patient advised as directed below. 

## 2016-02-13 NOTE — Telephone Encounter (Signed)
Letter has been printed and faxed to number below Attn: Ronnald Ramp. Thanks. JB

## 2016-02-13 NOTE — Telephone Encounter (Signed)
Pt stated that her work needed a letter stating her injury was due to work related repetitive motion. Pt stated that the letter must be signed by an MD. Pt would like it faxed to Ronnald Ramp @  [phone]. Pt would like a call back for an update about the letter. Please advise. Thanks TNP

## 2016-02-20 DIAGNOSIS — E1165 Type 2 diabetes mellitus with hyperglycemia: Secondary | ICD-10-CM | POA: Diagnosis not present

## 2016-03-30 ENCOUNTER — Other Ambulatory Visit: Payer: Self-pay | Admitting: Family Medicine

## 2016-03-30 DIAGNOSIS — E119 Type 2 diabetes mellitus without complications: Secondary | ICD-10-CM

## 2016-04-08 DIAGNOSIS — R0602 Shortness of breath: Secondary | ICD-10-CM | POA: Diagnosis not present

## 2016-04-08 DIAGNOSIS — I2510 Atherosclerotic heart disease of native coronary artery without angina pectoris: Secondary | ICD-10-CM | POA: Diagnosis not present

## 2016-04-08 DIAGNOSIS — E669 Obesity, unspecified: Secondary | ICD-10-CM | POA: Diagnosis not present

## 2016-04-08 DIAGNOSIS — I208 Other forms of angina pectoris: Secondary | ICD-10-CM | POA: Diagnosis not present

## 2016-05-12 ENCOUNTER — Ambulatory Visit (INDEPENDENT_AMBULATORY_CARE_PROVIDER_SITE_OTHER): Payer: Federal, State, Local not specified - PPO | Admitting: Physician Assistant

## 2016-05-12 ENCOUNTER — Encounter: Payer: Self-pay | Admitting: Physician Assistant

## 2016-05-12 VITALS — BP 144/68 | HR 88 | Temp 98.2°F | Resp 18 | Wt 165.0 lb

## 2016-05-12 DIAGNOSIS — I10 Essential (primary) hypertension: Secondary | ICD-10-CM

## 2016-05-12 DIAGNOSIS — E119 Type 2 diabetes mellitus without complications: Secondary | ICD-10-CM

## 2016-05-12 DIAGNOSIS — E7800 Pure hypercholesterolemia, unspecified: Secondary | ICD-10-CM

## 2016-05-12 MED ORDER — GLIPIZIDE 10 MG PO TABS: ORAL_TABLET | ORAL | 5 refills | Status: DC

## 2016-05-12 MED ORDER — SITAGLIPTIN PHOSPHATE 100 MG PO TABS: 100.0000 mg | ORAL_TABLET | Freq: Every day | ORAL | 5 refills | Status: DC

## 2016-05-12 MED ORDER — DAPAGLIFLOZIN PROPANEDIOL 10 MG PO TABS: 10.0000 mg | ORAL_TABLET | Freq: Every day | ORAL | 5 refills | Status: DC

## 2016-05-12 MED ORDER — ATORVASTATIN CALCIUM 20 MG PO TABS: 20.0000 mg | ORAL_TABLET | Freq: Every day | ORAL | 1 refills | Status: DC

## 2016-05-12 NOTE — Progress Notes (Signed)
Patient: Jacqueline Sparks Female    DOB: [birth year]   55 y.o.   MRN: [id number] Visit Date: 05/13/2016  Today's Provider: Mar Daring, PA-C   Chief Complaint  Patient presents with  . Diabetes  . Hyperlipidemia  . Hypertension   Subjective:    HPI  Diabetes Mellitus Type II, Follow-up:   Lab Results  Component Value Date   HGBA1C 8.6 02/11/2016   HGBA1C 8.6 11/12/2015   HGBA1C 8.7 06/23/2014    Last seen for diabetes 3 months ago.  Management since then includes none. She reports good compliance with treatment. She is not having side effects.  Current symptoms include none and have been unchanged. Home blood sugar records: 150's-250's  Episodes of hypoglycemia? no   Current Insulin Regimen: n/a Most Recent Eye Exam: several years ago Current exercise: walking, every now and then  Pertinent Labs:    Component Value Date/Time   CHOL 253 (H) 11/19/2015 0810   TRIG 469 (H) 11/19/2015 0810   HDL 29 (L) 11/19/2015 0810   LDLCALC Comment 11/19/2015 0810   CREATININE 0.81 11/19/2015 0810    Wt Readings from Last 3 Encounters:  05/12/16 165 lb (74.8 kg)  02/11/16 169 lb 3.2 oz (76.7 kg)  11/12/15 166 lb (75.3 kg)    ------------------------------------------------------------------------   Hypertension, follow-up:  BP Readings from Last 3 Encounters:  05/12/16 (!) 144/68  02/11/16 (!) 150/88  11/12/15 134/70    She was last seen for hypertension 3 months ago.  BP at that visit was 150/88. Management since that visit includes none. She reports good compliance with treatment. She is not having side effects.  She is not exercising. She is adherent to low salt diet.   Outside blood pressures are not being checked. She is experiencing dyspnea every now and then, this is nothing new for pt and she just saw her cardiologist recently.   Patient denies chest pain, chest pressure/discomfort, claudication, dyspnea, exertional chest  pressure/discomfort, fatigue, irregular heart beat, lower extremity edema, near-syncope, orthopnea and palpitations.   Cardiovascular risk factors include diabetes mellitus, dyslipidemia, hypertension and obesity (BMI >= 30 kg/m2).   Wt Readings from Last 3 Encounters:  05/12/16 165 lb (74.8 kg)  02/11/16 169 lb 3.2 oz (76.7 kg)  11/12/15 166 lb (75.3 kg)    ------------------------------------------------------------------------   Lipid/Cholesterol, Follow-up:   Last seen for this6 months ago.  Management changes since that visit include increased Lipitor to 20 mg daily. Due to recheck labs today.  . Last Lipid Panel:    Component Value Date/Time   CHOL 253 (H) 11/19/2015 0810   TRIG 469 (H) 11/19/2015 0810   HDL 29 (L) 11/19/2015 0810   CHOLHDL 8.7 (H) 11/19/2015 0810   [place] Comment 11/19/2015 0810    She reports good compliance with treatment. She is not having side effects.  Current symptoms include none and have been unchanged.   Wt Readings from Last 3 Encounters:  05/12/16 165 lb (74.8 kg)  02/11/16 169 lb 3.2 oz (76.7 kg)  11/12/15 166 lb (75.3 kg)    -------------------------------------------------------------------     No Known Allergies Current Meds  Medication Sig  . ALPRAZolam (XANAX) 0.5 MG tablet TAKE ONE-HALF TO ONE TABLET BY MOUTH TWICE DAILY AS NEEDED  . atorvastatin (LIPITOR) 20 MG tablet Take 1 tablet (20 mg total) by mouth daily.  . dapagliflozin propanediol (FARXIGA) 10 MG TABS tablet Take 10 mg by mouth daily.  Marland Kitchen glipiZIDE (GLUCOTROL) 10 MG tablet  TAKE ONE TABLET BY MOUTH TWICE DAILY 30 MINUTES BEFORE A MEAL  . NON FORMULARY CPAP  . ONE TOUCH ULTRA TEST test strip USE ONE STRIP TO CHECK GLUCOSE ONCE DAILY  . sitaGLIPtin (JANUVIA) 100 MG tablet Take 1 tablet (100 mg total) by mouth daily.  . [DISCONTINUED] atorvastatin (LIPITOR) 20 MG tablet Take 1 tablet (20 mg total) by mouth daily.  . [DISCONTINUED] dapagliflozin propanediol (FARXIGA)  10 MG TABS tablet   . [DISCONTINUED] glipiZIDE (GLUCOTROL) 10 MG tablet TAKE ONE TABLET BY MOUTH TWICE DAILY 30 MINUTES BEFORE A MEAL  . [DISCONTINUED] JANUVIA 100 MG tablet TAKE ONE TABLET BY MOUTH ONCE DAILY    Review of Systems  Constitutional: Negative.   HENT: Negative.   Eyes: Negative.   Respiratory: Positive for shortness of breath.   Gastrointestinal: Negative.   Endocrine: Negative.   Genitourinary: Negative.   Musculoskeletal: Negative.   Allergic/Immunologic: Negative.   Neurological: Negative.   Hematological: Negative.   Psychiatric/Behavioral: Negative.     Social History  Substance Use Topics  . Smoking status: Current Every Day Smoker    Packs/day: 1.00    Types: Cigarettes  . Smokeless tobacco: Never Used  . Alcohol use No   Objective:   BP (!) 144/68   Pulse 88   Temp 98.2 F (36.8 C) (Oral)   Resp 18   Wt 165 lb (74.8 kg)   BMI 30.18 kg/m   Physical Exam  Constitutional: She appears well-developed and well-nourished. No distress.  Neck: Normal range of motion. Neck supple. No JVD present. No tracheal deviation present. No thyromegaly present.  Cardiovascular: Normal rate, regular rhythm and normal heart sounds.  Exam reveals no gallop and no friction rub.   No murmur heard. Pulmonary/Chest: Effort normal and breath sounds normal. No respiratory distress. She has no wheezes. She has no rales.  Musculoskeletal: She exhibits no edema.  Lymphadenopathy:    She has no cervical adenopathy.  Skin: She is not diaphoretic.  Vitals reviewed.     Assessment & Plan:     1. Type 2 diabetes mellitus without complication, without long-term current use of insulin (HCC) Stable. Diagnosis pulled for medication refill. Continue current medical treatment plan. Will check labs as below and f/u pending results. - sitaGLIPtin (JANUVIA) 100 MG tablet; Take 1 tablet (100 mg total) by mouth daily.  Dispense: 30 tablet; Refill: 5 - glipiZIDE (GLUCOTROL) 10 MG tablet;  TAKE ONE TABLET BY MOUTH TWICE DAILY 30 MINUTES BEFORE A MEAL  Dispense: 60 tablet; Refill: 5 - dapagliflozin propanediol (FARXIGA) 10 MG TABS tablet; Take 10 mg by mouth daily.  Dispense: 30 tablet; Refill: 5 - CBC with Differential - Comprehensive Metabolic Panel (CMET) - HgB A1c  2. Hypercholesteremia Stable. Diagnosis pulled for medication refill. Continue current medical treatment plan. Will check labs as below and f/u pending results. - atorvastatin (LIPITOR) 20 MG tablet; Take 1 tablet (20 mg total) by mouth daily.  Dispense: 90 tablet; Refill: 1 - Lipid Profile  3. Essential hypertension Stable. Continue current medical treatment plan.       Mar Daring, PA-C  [facility]

## 2016-05-12 NOTE — Patient Instructions (Signed)
Diabetes and Foot Care Diabetes may cause you to have problems because of poor blood supply (circulation) to your feet and legs. This may cause the skin on your feet to become thinner, break easier, and heal more slowly. Your skin may become dry, and the skin may peel and crack. You may also have nerve damage in your legs and feet causing decreased feeling in them. You may not notice minor injuries to your feet that could lead to infections or more serious problems. Taking care of your feet is one of the most important things you can do for yourself.  HOME CARE INSTRUCTIONS  Wear shoes at all times, even in the house. Do not go barefoot. Bare feet are easily injured.  Check your feet daily for blisters, cuts, and redness. If you cannot see the bottom of your feet, use a mirror or ask someone for help.  Wash your feet with warm water (do not use hot water) and mild soap. Then pat your feet and the areas between your toes until they are completely dry. Do not soak your feet as this can dry your skin.  Apply a moisturizing lotion or petroleum jelly (that does not contain alcohol and is unscented) to the skin on your feet and to dry, brittle toenails. Do not apply lotion between your toes.  Trim your toenails straight across. Do not dig under them or around the cuticle. File the edges of your nails with an emery board or nail file.  Do not cut corns or calluses or try to remove them with medicine.  Wear clean socks or stockings every day. Make sure they are not too tight. Do not wear knee-high stockings since they may decrease blood flow to your legs.  Wear shoes that fit properly and have enough cushioning. To break in new shoes, wear them for just a few hours a day. This prevents you from injuring your feet. Always look in your shoes before you put them on to be sure there are no objects inside.  Do not cross your legs. This may decrease the blood flow to your feet.  If you find a minor scrape,  cut, or break in the skin on your feet, keep it and the skin around it clean and dry. These areas may be cleansed with mild soap and water. Do not cleanse the area with peroxide, alcohol, or iodine.  When you remove an adhesive bandage, be sure not to damage the skin around it.  If you have a wound, look at it several times a day to make sure it is healing.  Do not use heating pads or hot water bottles. They may burn your skin. If you have lost feeling in your feet or legs, you may not know it is happening until it is too late.  Make sure your health care provider performs a complete foot exam at least annually or more often if you have foot problems. Report any cuts, sores, or bruises to your health care provider immediately. SEEK MEDICAL CARE IF:   You have an injury that is not healing.  You have cuts or breaks in the skin.  You have an ingrown nail.  You notice redness on your legs or feet.  You feel burning or tingling in your legs or feet.  You have pain or cramps in your legs and feet.  Your legs or feet are numb.  Your feet always feel cold. SEEK IMMEDIATE MEDICAL CARE IF:   There is increasing redness,   swelling, or pain in or around a wound.  There is a red line that goes up your leg.  Pus is coming from a wound.  You develop a fever or as directed by your health care provider.  You notice a bad smell coming from an ulcer or wound.   This information is not intended to replace advice given to you by your health care provider. Make sure you discuss any questions you have with your health care provider.   Document Released: 10/03/2000 Document Revised: 06/08/2013 Document Reviewed: 03/15/2013 Elsevier Interactive Patient Education 2016 [employer]  

## 2016-05-26 DIAGNOSIS — E7800 Pure hypercholesterolemia, unspecified: Secondary | ICD-10-CM | POA: Diagnosis not present

## 2016-05-26 DIAGNOSIS — E119 Type 2 diabetes mellitus without complications: Secondary | ICD-10-CM | POA: Diagnosis not present

## 2016-05-27 LAB — COMPREHENSIVE METABOLIC PANEL
ALBUMIN: 4.4 g/dL (ref 3.5–5.5)
ALT: 14 IU/L (ref 0–32)
AST: 13 IU/L (ref 0–40)
Albumin/Globulin Ratio: 1.3 (ref 1.2–2.2)
Alkaline Phosphatase: 101 IU/L (ref 39–117)
BUN / CREAT RATIO: 7 — AB (ref 9–23)
BUN: 5 mg/dL — AB (ref 6–24)
Bilirubin Total: 0.2 mg/dL (ref 0.0–1.2)
CALCIUM: 10 mg/dL (ref 8.7–10.2)
CO2: 26 mmol/L (ref 18–29)
CREATININE: 0.75 mg/dL (ref 0.57–1.00)
Chloride: 97 mmol/L (ref 96–106)
GFR, EST AFRICAN AMERICAN: 105 mL/min/{1.73_m2} (ref >59)
GFR, EST NON AFRICAN AMERICAN: 91 mL/min/{1.73_m2} (ref >59)
GLOBULIN, TOTAL: 3.5 g/dL (ref 1.5–4.5)
GLUCOSE: 190 mg/dL — AB (ref 65–99)
Potassium: 3.9 mmol/L (ref 3.5–5.2)
SODIUM: 142 mmol/L (ref 134–144)
TOTAL PROTEIN: 7.9 g/dL (ref 6.0–8.5)

## 2016-05-27 LAB — LIPID PANEL
CHOLESTEROL TOTAL: 173 mg/dL (ref 100–199)
Chol/HDL Ratio: 6.7 ratio units — ABNORMAL HIGH (ref 0.0–4.4)
HDL: 26 mg/dL — ABNORMAL LOW (ref >39)
Triglycerides: 408 mg/dL — ABNORMAL HIGH (ref 0–149)

## 2016-05-27 LAB — CBC WITH DIFFERENTIAL/PLATELET
BASOS ABS: 0 10*3/uL (ref 0.0–0.2)
Basos: 0 %
EOS (ABSOLUTE): 0.2 10*3/uL (ref 0.0–0.4)
Eos: 2 %
Hematocrit: 42 % (ref 34.0–46.6)
Hemoglobin: 14.2 g/dL (ref 11.1–15.9)
IMMATURE GRANS (ABS): 0 10*3/uL (ref 0.0–0.1)
Immature Granulocytes: 0 %
LYMPHS: 34 %
Lymphocytes Absolute: 3.6 10*3/uL — ABNORMAL HIGH (ref 0.7–3.1)
MCH: 28.1 pg (ref 26.6–33.0)
MCHC: 33.8 g/dL (ref 31.5–35.7)
MCV: 83 fL (ref 79–97)
MONOS ABS: 0.6 10*3/uL (ref 0.1–0.9)
Monocytes: 6 %
NEUTROS ABS: 6.2 10*3/uL (ref 1.4–7.0)
NEUTROS PCT: 58 %
PLATELETS: 357 10*3/uL (ref 150–379)
RBC: 5.05 x10E6/uL (ref 3.77–5.28)
RDW: 15.7 % — AB (ref 12.3–15.4)
WBC: 10.6 10*3/uL (ref 3.4–10.8)

## 2016-05-27 LAB — HEMOGLOBIN A1C
Est. average glucose Bld gHb Est-mCnc: 220 mg/dL
HEMOGLOBIN A1C: 9.3 % — AB (ref 4.8–5.6)

## 2016-05-28 ENCOUNTER — Telehealth: Payer: Self-pay

## 2016-05-28 DIAGNOSIS — E119 Type 2 diabetes mellitus without complications: Secondary | ICD-10-CM

## 2016-05-28 NOTE — Telephone Encounter (Signed)
-----   Message from Mar Daring, [place] sent at 05/28/2016 10:56 AM EDT ----- Cholesterol has improved slightly but triglycerides still very elevated. This is most closely related to diet. Make sure to eat healthy foods, more vegetables, less fats and starch. May benefit by adding fish oil 1200mg  BID. We can recheck in 6 months. If still elevated I will increase statin. Also HgBA1c has increased to 9.3 from 8.6. Recommend either starting actos back, along with farxiga, glipizide, and januvia or we can add one of the newer once weekly injectable medications. Let me know what you prefer. Will recheck HgBA1c in 3 months.

## 2016-05-28 NOTE — Telephone Encounter (Signed)
Patient advised as directed below.She reports that she will add the actos back and she doesn't need another prescription.  Thanks,  -Joseline

## 2016-07-04 ENCOUNTER — Encounter: Payer: Self-pay | Admitting: Family Medicine

## 2016-07-04 ENCOUNTER — Ambulatory Visit (INDEPENDENT_AMBULATORY_CARE_PROVIDER_SITE_OTHER): Payer: Federal, State, Local not specified - PPO | Admitting: Family Medicine

## 2016-07-04 VITALS — BP 110/56 | HR 76 | Temp 98.8°F | Resp 16 | Wt 156.4 lb

## 2016-07-04 DIAGNOSIS — L0213 Carbuncle of neck: Secondary | ICD-10-CM

## 2016-07-04 MED ORDER — DOXYCYCLINE HYCLATE 100 MG PO TABS: 100.0000 mg | ORAL_TABLET | Freq: Two times a day (BID) | ORAL | 0 refills | Status: DC

## 2016-07-04 NOTE — Patient Instructions (Signed)
Continue warm compresses. If not improving over the weekend return to the office next week. Saturday clinic is available if much worse.

## 2016-07-04 NOTE — Progress Notes (Signed)
Subjective:     Patient ID: Jacqueline Sparks, female   DOB: 1961/02/19, 55 y.o.   MRN: [id number]  HPI  Chief Complaint  Patient presents with  . Skin Problem    Patient comes in office today to address possible boil on the back of her neck. Patient states that bump has been present for 7 days and has grown in size and tender to the touch.   Denis prior cyst or drainage. No hx of or exposure to MRSA. 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   Review of Systems     Objective:   Physical Exam  Constitutional: She appears well-developed and well-nourished. No distress.  Skin:  Posterior neck with a raised, tender area of induration. Small eschar overlies but no pointing or drainage.       Assessment:    1. Carbuncle of neck - doxycycline (VIBRA-TABS) 100 MG tablet; Take 1 tablet (100 mg total) by mouth 2 (two) times daily.  Dispense: 14 tablet; Refill: 0    Plan:    Warm compresses abx with return if not improving or pointing.

## 2016-07-28 ENCOUNTER — Other Ambulatory Visit: Payer: Self-pay | Admitting: Physician Assistant

## 2016-07-28 DIAGNOSIS — F419 Anxiety disorder, unspecified: Secondary | ICD-10-CM

## 2016-07-28 NOTE — Telephone Encounter (Signed)
Called into [facility] [place]. Rickia [place] Drozdowski, CMA  

## 2016-08-06 ENCOUNTER — Ambulatory Visit (INDEPENDENT_AMBULATORY_CARE_PROVIDER_SITE_OTHER): Admitting: Orthopedic Surgery

## 2016-08-06 DIAGNOSIS — M7541 Impingement syndrome of right shoulder: Secondary | ICD-10-CM | POA: Diagnosis not present

## 2016-08-06 DIAGNOSIS — M25511 Pain in right shoulder: Secondary | ICD-10-CM | POA: Diagnosis not present

## 2016-08-06 DIAGNOSIS — M7711 Lateral epicondylitis, right elbow: Secondary | ICD-10-CM | POA: Diagnosis not present

## 2016-08-11 ENCOUNTER — Encounter: Payer: Self-pay | Admitting: Physician Assistant

## 2016-08-11 ENCOUNTER — Ambulatory Visit (INDEPENDENT_AMBULATORY_CARE_PROVIDER_SITE_OTHER): Payer: Federal, State, Local not specified - PPO | Admitting: Physician Assistant

## 2016-08-11 VITALS — BP 130/70 | HR 85 | Temp 98.1°F | Resp 16 | Wt 152.8 lb

## 2016-08-11 DIAGNOSIS — E7800 Pure hypercholesterolemia, unspecified: Secondary | ICD-10-CM | POA: Diagnosis not present

## 2016-08-11 DIAGNOSIS — I10 Essential (primary) hypertension: Secondary | ICD-10-CM | POA: Diagnosis not present

## 2016-08-11 DIAGNOSIS — E1165 Type 2 diabetes mellitus with hyperglycemia: Secondary | ICD-10-CM

## 2016-08-11 LAB — POCT GLYCOSYLATED HEMOGLOBIN (HGB A1C)
Est. average glucose Bld gHb Est-mCnc: 243
Hemoglobin A1C: 10.1

## 2016-08-11 MED ORDER — PIOGLITAZONE HCL 45 MG PO TABS: 45.0000 mg | ORAL_TABLET | Freq: Every day | ORAL | 5 refills | Status: DC

## 2016-08-11 NOTE — Progress Notes (Signed)
Patient: Jacqueline Sparks Female    DOB: [birth year]   55 y.o.   MRN: [id number] Visit Date: 08/11/2016  Today's Provider: Mar Daring, PA-C   Chief Complaint  Patient presents with  . Follow-up    Diabetes,HDL, HTN   Subjective:    HPI  Diabetes Mellitus Type II, Follow-up:   Lab Results  Component Value Date   HGBA1C 10.1 08/11/2016   HGBA1C 9.3 (H) 05/26/2016   HGBA1C 8.6 02/11/2016   Last seen for diabetes 3 months ago.  Management since then includes Actos was added, but reports that the pharmacy didn't give it to her with other medications. She reports excellent compliance with treatment. She is not having side effects.  Current symptoms include polydipsia, polyuria and weight loss and have been unchanged. Home blood sugar records: 180-210  Episodes of hypoglycemia? no   Most Recent Eye Exam:  Weight trend: decreasing rapidly Current diet: She reports she is eating once a day. She makes herself eat. Current exercise: walking  ------------------------------------------------------------------------   Hypertension, follow-up:  BP Readings from Last 3 Encounters:  08/11/16 130/70  07/04/16 (!) 110/56  05/12/16 (!) 144/68    She was last seen for hypertension 3 months ago.  BP at that visit was 144/68. Management since that visit includes none.She reports excellent compliance with treatment. She is not having side effects.  She is not exercising. She walks her dog. She is adherent to low salt diet.   Outside blood pressures are n/a. She is experiencing dyspnea she usually have this, she sees cardiology. Patient denies chest pain, chest pressure/discomfort, fatigue, irregular heart beat, lower extremity edema and palpitations.   Cardiovascular risk factors include diabetes mellitus, dyslipidemia, hypertension and obesity (BMI >= 30 kg/m2).   ------------------------------------------------------------------------    Lipid/Cholesterol,  Follow-up:   Last seen for this 3 months ago.  Management since that visit includes none.  Last Lipid Panel:    Component Value Date/Time   CHOL 173 05/26/2016 1032   TRIG 408 (H) 05/26/2016 1032   HDL 26 (L) 05/26/2016 1032   CHOLHDL 6.7 (H) 05/26/2016 1032   [place] Comment 05/26/2016 1032    She reports excellent compliance with treatment. She is not having side effects.   Wt Readings from Last 3 Encounters:  08/11/16 152 lb 12.8 oz (69.3 kg)  07/04/16 156 lb 6.4 oz (70.9 kg)  05/12/16 165 lb (74.8 kg)   ------------------------------------------------------------------------     No Known Allergies   Current Outpatient Prescriptions:  .  ALPRAZolam (XANAX) 0.5 MG tablet, TAKE ONE-HALF TO ONE TABLET BY MOUTH TWICE DAILY AS NEEDED, Disp: 60 tablet, Rfl: 5 .  atorvastatin (LIPITOR) 20 MG tablet, Take 1 tablet (20 mg total) by mouth daily., Disp: 90 tablet, Rfl: 1 .  clobetasol ointment (TEMOVATE) 0.05 %, , Disp: , Rfl:  .  dapagliflozin propanediol (FARXIGA) 10 MG TABS tablet, Take 10 mg by mouth daily., Disp: 30 tablet, Rfl: 5 .  doxycycline (VIBRA-TABS) 100 MG tablet, Take 1 tablet (100 mg total) by mouth 2 (two) times daily., Disp: 14 tablet, Rfl: 0 .  glipiZIDE (GLUCOTROL) 10 MG tablet, TAKE ONE TABLET BY MOUTH TWICE DAILY 30 MINUTES BEFORE A MEAL, Disp: 60 tablet, Rfl: 5 .  NON FORMULARY, CPAP, Disp: , Rfl:  .  ONE TOUCH ULTRA TEST test strip, USE ONE STRIP TO CHECK GLUCOSE ONCE DAILY, Disp: 100 each, Rfl: 1 .  pioglitazone (ACTOS) 45 MG tablet, Take 1 tablet (45 mg total) by  mouth daily., Disp: 30 tablet, Rfl: 11 .  sitaGLIPtin (JANUVIA) 100 MG tablet, Take 1 tablet (100 mg total) by mouth daily., Disp: 30 tablet, Rfl: 5  Review of Systems  Constitutional: Positive for appetite change (not hungry;only eating one meal per day). Negative for activity change, diaphoresis, fatigue, fever and unexpected weight change.  HENT: Negative.   Eyes: Negative.   Respiratory:  Negative.   Cardiovascular: Negative.   Gastrointestinal: Negative.   Endocrine: Positive for polydipsia and polyuria. Negative for cold intolerance, heat intolerance and polyphagia.  Genitourinary: Negative.   Musculoskeletal: Negative.   Neurological: Negative.   Psychiatric/Behavioral: Negative.     Social History  Substance Use Topics  . Smoking status: Current Every Day Smoker    Packs/day: 1.00    Types: Cigarettes  . Smokeless tobacco: Never Used  . Alcohol use No   Objective:   BP 130/70 (BP Location: Right Arm, Patient Position: Sitting, Cuff Size: Normal)   Pulse 85   Temp 98.1 F (36.7 C) (Oral)   Resp 16   Wt 152 lb 12.8 oz (69.3 kg)   BMI 27.95 kg/m   Physical Exam  Constitutional: She appears well-developed and well-nourished. No distress.  Neck: Normal range of motion. Neck supple. No JVD present. No tracheal deviation present. No thyromegaly present.  Cardiovascular: Normal rate, regular rhythm and normal heart sounds.  Exam reveals no gallop and no friction rub.   No murmur heard. Pulmonary/Chest: Effort normal and breath sounds normal. No respiratory distress. She has no wheezes. She has no rales.  Abdominal: Soft. Bowel sounds are normal. She exhibits no distension and no mass. There is no tenderness.  Musculoskeletal: She exhibits no edema.  Lymphadenopathy:    She has no cervical adenopathy.  Skin: She is not diaphoretic.  Vitals reviewed.     Assessment & Plan:     1. Essential hypertension Stable. Will check labs in 3 months.  2. Type 2 diabetes mellitus with hyperglycemia, unspecified long term insulin use status (HCC) Uncontrolled and worsening. Patient declines GLP1-agonist. She had only been taking januvia 100mg , farxiga 10mg , and glipizide 10mg . She had not been taking Actos as directed. Will add Actos back and she is to call if pharmacy does not have available. I will see her back in 3 months and recheck labs.  - POCT glycosylated  hemoglobin (Hb A1C) - pioglitazone (ACTOS) 45 MG tablet; Take 1 tablet (45 mg total) by mouth daily.  Dispense: 30 tablet; Refill: 5  3. Hypercholesteremia Previously stable with high triglycerides. Continue Lipitor 20mg   Will check labs in 3 months.       Mar Daring, PA-C  [facility]

## 2016-08-11 NOTE — Patient Instructions (Signed)
Diabetes Mellitus and Food It is important for you to manage your blood sugar (glucose) level. Your blood glucose level can be greatly affected by what you eat. Eating healthier foods in the appropriate amounts throughout the day at about the same time each day will help you control your blood glucose level. It can also help slow or prevent worsening of your diabetes mellitus. Healthy eating may even help you improve the level of your blood pressure and reach or maintain a healthy weight.  General recommendations for healthful eating and cooking habits include:  Eating meals and snacks regularly. Avoid going long periods of time without eating to lose weight.  Eating a diet that consists mainly of plant-based foods, such as fruits, vegetables, nuts, legumes, and whole grains.  Using low-heat cooking methods, such as baking, instead of high-heat cooking methods, such as deep frying. Work with your dietitian to make sure you understand how to use the Nutrition Facts information on food labels. HOW CAN FOOD AFFECT ME? Carbohydrates Carbohydrates affect your blood glucose level more than any other type of food. Your dietitian will help you determine how many carbohydrates to eat at each meal and teach you how to count carbohydrates. Counting carbohydrates is important to keep your blood glucose at a healthy level, especially if you are using insulin or taking certain medicines for diabetes mellitus. Alcohol Alcohol can cause sudden decreases in blood glucose (hypoglycemia), especially if you use insulin or take certain medicines for diabetes mellitus. Hypoglycemia can be a life-threatening condition. Symptoms of hypoglycemia (sleepiness, dizziness, and disorientation) are similar to symptoms of having too much alcohol.  If your health care provider has given you approval to drink alcohol, do so in moderation and use the following guidelines:  Women should not have more than one drink per day, and men  should not have more than two drinks per day. One drink is equal to:  12 oz of beer.  5 oz of wine.  1 oz of hard liquor.  Do not drink on an empty stomach.  Keep yourself hydrated. Have water, diet soda, or unsweetened iced tea.  Regular soda, juice, and other mixers might contain a lot of carbohydrates and should be counted. WHAT FOODS ARE NOT RECOMMENDED? As you make food choices, it is important to remember that all foods are not the same. Some foods have fewer nutrients per serving than other foods, even though they might have the same number of calories or carbohydrates. It is difficult to get your body what it needs when you eat foods with fewer nutrients. Examples of foods that you should avoid that are high in calories and carbohydrates but low in nutrients include:  Trans fats (most processed foods list trans fats on the Nutrition Facts label).  Regular soda.  Juice.  Candy.  Sweets, such as cake, pie, doughnuts, and cookies.  Fried foods. WHAT FOODS CAN I EAT? Eat nutrient-rich foods, which will nourish your body and keep you healthy. The food you should eat also will depend on several factors, including:  The calories you need.  The medicines you take.  Your weight.  Your blood glucose level.  Your blood pressure level.  Your cholesterol level. You should eat a variety of foods, including:  Protein.  Lean cuts of meat.  Proteins low in saturated fats, such as fish, egg whites, and beans. Avoid processed meats.  Fruits and vegetables.  Fruits and vegetables that may help control blood glucose levels, such as apples, mangoes, and   yams.  Dairy products.  Choose fat-free or low-fat dairy products, such as milk, yogurt, and cheese.  Grains, bread, pasta, and rice.  Choose whole grain products, such as multigrain bread, whole oats, and brown rice. These foods may help control blood pressure.  Fats.  Foods containing healthful fats, such as nuts,  avocado, olive oil, canola oil, and fish. DOES EVERYONE WITH DIABETES MELLITUS HAVE THE SAME MEAL PLAN? Because every person with diabetes mellitus is different, there is not one meal plan that works for everyone. It is very important that you meet with a dietitian who will help you create a meal plan that is just right for you.   This information is not intended to replace advice given to you by your health care provider. Make sure you discuss any questions you have with your health care provider.   Document Released: 07/03/2005 Document Revised: 10/27/2014 Document Reviewed: 09/02/2013 Elsevier Interactive Patient Education 2016 Elsevier Inc.  

## 2016-09-08 ENCOUNTER — Ambulatory Visit (INDEPENDENT_AMBULATORY_CARE_PROVIDER_SITE_OTHER): Admitting: Orthopedic Surgery

## 2016-09-08 ENCOUNTER — Encounter (INDEPENDENT_AMBULATORY_CARE_PROVIDER_SITE_OTHER): Payer: Self-pay | Admitting: Orthopedic Surgery

## 2016-09-08 VITALS — Ht 63.0 in | Wt 155.0 lb

## 2016-09-08 DIAGNOSIS — M25511 Pain in right shoulder: Secondary | ICD-10-CM

## 2016-09-08 DIAGNOSIS — M7711 Lateral epicondylitis, right elbow: Secondary | ICD-10-CM

## 2016-09-08 DIAGNOSIS — G8929 Other chronic pain: Secondary | ICD-10-CM

## 2016-09-08 DIAGNOSIS — M25512 Pain in left shoulder: Secondary | ICD-10-CM

## 2016-09-08 NOTE — Progress Notes (Signed)
Office Visit Note   Patient: Jacqueline Sparks           Date of Birth: Jun 14, 1961           MRN: [id number] Visit Date: 09/08/2016              Requested by: Mar Daring, PA-C [address], [place] [id number] PCP: Mar Daring, PA-C   Assessment & Plan: Visit Diagnoses:  1. Lateral epicondylitis, right elbow   2. Chronic pain of both shoulders     Plan: Discussed that we could inject the lateral epicondyle patient states she does not want to proceed with injection this time. She'll continue using her tennis elbow strap. Patient was given a note to return to restricted work maximum lifting 10 pounds no climbing no standing greater than 2 hours. Discussed that we could consider an injection for the lateral epicondylitis at follow-up. Patient states she also has left shoulder pain which has been going on and she states she has not filed a claim for this yet. She states her right shoulder has improved with the last injection.  Follow-Up Instructions: Return in about 4 weeks (around 10/06/2016).   Orders:  No orders of the defined types were placed in this encounter.  No orders of the defined types were placed in this encounter.     Procedures: No procedures performed   Clinical Data: No additional findings.   Subjective: Chief Complaint  Patient presents with  . Right Shoulder - Follow-up    Impingement syndrome right shoulder  . Right Elbow - Follow-up    Lateral epicondylitis, right elbow    Patient presents for follow up right shoulder impingement and lateral right epicondylitis. She is wearing tennis elbow brace with no noticeable relief. She still has current restrictions for no pulling or pushing with her right arm, maximum lifting 10 pounds with the right arm, no climbing ladders.  Must alternate sitting and standing. No standing for longer than 2 hours at a time    Review of Systems   Objective: Vital Signs: Ht 5\' 3"  (1.6 m)    Wt 155 lb (70.3 kg)   BMI 27.46 kg/m   Physical Exam on examination patient is alert oriented no adenopathy well-dressed normal affect normal respiratory effort she has normal gait. Examination she has good range of motion of both shoulders and has mild symptoms with Neer and Hawkins impingement test with range of motion. Examination of right upper extremity she has pain to palpation of lateral condyle likely not a medial epicondyle nontender to palpation resisted extension the wrist reproduces wrist pain and lateral condyle pain.  Ortho Exam  Specialty Comments:  No specialty comments available.  Imaging: No results found.   PMFS History: Patient Active Problem List   Diagnosis Date Noted  . BP (high blood pressure) 11/12/2015  . Abnormal abdominal MRI 11/09/2015  . Abnormal ECG 11/09/2015  . Anxiety 11/09/2015  . Type 2 diabetes mellitus with hyperglycemia ([place]) 11/09/2015  . Diabetes ([place]) 05/14/2015  . Adiposity 01/30/2014  . Avitaminosis D 05/03/2010  . Obstructive apnea 06/09/2007  . Hypercholesteremia 06/09/2007  . Compulsive tobacco user syndrome 06/09/2007  . Renal tubular disorder 05/19/2007  . Diabetes mellitus, type 2 ([place]) 06/04/2006  . HLD (hyperlipidemia) 06/04/2006  . Apnea, sleep 06/04/2006   Past Medical History:  Diagnosis Date  . Anxiety   . Diabetes mellitus without complication ([place])   . Hyperlipidemia   . Sleep apnea  Family History  Problem Relation Age of Onset  . Hyperlipidemia Mother   . Hypertension Mother   . Diabetes Mother   . Diabetes Father   . Hyperlipidemia Father   . Hypertension Father     Past Surgical History:  Procedure Laterality Date  . CARPAL TUNNEL RELEASE  2001   as staated this was the left side, right side was completed in 2003  . DILATION AND CURETTAGE OF UTERUS  2008   as stated uterine polyps  . ROTATOR CUFF REPAIR Right 3004 and 2010  . TUBAL LIGATION  1999   Social History   Occupational History  .  Not on file.   Social History Main Topics  . Smoking status: Current Every Day Smoker    Packs/day: 1.00    Types: Cigarettes  . Smokeless tobacco: Never Used  . Alcohol use No  . Drug use: No  . Sexual activity: Not on file

## 2016-09-18 ENCOUNTER — Emergency Department
Admission: EM | Admit: 2016-09-18 | Discharge: 2016-09-18 | Disposition: A | Discharge status: Home / Self Care | Payer: Federal, State, Local not specified - PPO | Attending: Emergency Medicine | Admitting: Emergency Medicine

## 2016-09-18 ENCOUNTER — Encounter: Payer: Self-pay | Admitting: *Deleted

## 2016-09-18 ENCOUNTER — Emergency Department: Payer: Federal, State, Local not specified - PPO

## 2016-09-18 DIAGNOSIS — X500XXA Overexertion from strenuous movement or load, initial encounter: Secondary | ICD-10-CM | POA: Insufficient documentation

## 2016-09-18 DIAGNOSIS — S4992XA Unspecified injury of left shoulder and upper arm, initial encounter: Secondary | ICD-10-CM | POA: Diagnosis present

## 2016-09-18 DIAGNOSIS — F17210 Nicotine dependence, cigarettes, uncomplicated: Secondary | ICD-10-CM | POA: Insufficient documentation

## 2016-09-18 DIAGNOSIS — Z79899 Other long term (current) drug therapy: Secondary | ICD-10-CM | POA: Insufficient documentation

## 2016-09-18 DIAGNOSIS — Y929 Unspecified place or not applicable: Secondary | ICD-10-CM | POA: Insufficient documentation

## 2016-09-18 DIAGNOSIS — E119 Type 2 diabetes mellitus without complications: Secondary | ICD-10-CM | POA: Diagnosis not present

## 2016-09-18 DIAGNOSIS — M7592 Shoulder lesion, unspecified, left shoulder: Secondary | ICD-10-CM | POA: Diagnosis not present

## 2016-09-18 DIAGNOSIS — M778 Other enthesopathies, not elsewhere classified: Secondary | ICD-10-CM

## 2016-09-18 DIAGNOSIS — Z7984 Long term (current) use of oral hypoglycemic drugs: Secondary | ICD-10-CM | POA: Diagnosis not present

## 2016-09-18 DIAGNOSIS — Y9389 Activity, other specified: Secondary | ICD-10-CM | POA: Insufficient documentation

## 2016-09-18 DIAGNOSIS — Y990 Civilian activity done for income or pay: Secondary | ICD-10-CM | POA: Diagnosis not present

## 2016-09-18 DIAGNOSIS — M67814 Other specified disorders of tendon, left shoulder: Secondary | ICD-10-CM

## 2016-09-18 DIAGNOSIS — M7522 Bicipital tendinitis, left shoulder: Secondary | ICD-10-CM | POA: Diagnosis not present

## 2016-09-18 DIAGNOSIS — M7582 Other shoulder lesions, left shoulder: Secondary | ICD-10-CM

## 2016-09-18 MED ORDER — NABUMETONE 750 MG PO TABS: 750.0000 mg | ORAL_TABLET | Freq: Two times a day (BID) | ORAL | 0 refills | Status: DC

## 2016-09-18 MED ORDER — CYCLOBENZAPRINE HCL 5 MG PO TABS: 5.0000 mg | ORAL_TABLET | Freq: Three times a day (TID) | ORAL | 0 refills | Status: DC | PRN

## 2016-09-18 NOTE — ED Notes (Signed)
Pt has pain in left shoulder .  Pt felt a pop while pulling a box at work.  States WC.

## 2016-09-18 NOTE — ED Provider Notes (Signed)
[facility] Emergency Department Provider Note ____________________________________________  Time seen: 1639  I have reviewed the triage vital signs and the nursing notes.  HISTORY  Chief Complaint  Shoulder Pain  HPI Jacqueline Sparks is a 55 y.o. female right hand dominant, reports to the ED for evaluation of left shoulder pain while at work today. The patient with a history of left shoulder impingement, as diagnosed by her primary care provider since April, describes a reinjury to the left shouldertoday. She reports intermittent pain and stiffness to the shoulder, which was worsened today after pulling boxes. She localizes pain to the anterior and dorsal shoulder. She dosed IBU 800 mg prior to arrival.   Past Medical History:  Diagnosis Date  . Anxiety   . Diabetes mellitus without complication ([place])   . Hyperlipidemia   . Sleep apnea     Patient Active Problem List   Diagnosis Date Noted  . BP (high blood pressure) 11/12/2015  . Abnormal abdominal MRI 11/09/2015  . Abnormal ECG 11/09/2015  . Anxiety 11/09/2015  . Type 2 diabetes mellitus with hyperglycemia ([place]) 11/09/2015  . Diabetes ([place]) 05/14/2015  . Adiposity 01/30/2014  . Avitaminosis D 05/03/2010  . Obstructive apnea 06/09/2007  . Hypercholesteremia 06/09/2007  . Compulsive tobacco user syndrome 06/09/2007  . Renal tubular disorder 05/19/2007  . Diabetes mellitus, type 2 ([place]) 06/04/2006  . HLD (hyperlipidemia) 06/04/2006  . Apnea, sleep 06/04/2006    Past Surgical History:  Procedure Laterality Date  . CARPAL TUNNEL RELEASE  2001   as staated this was the left side, right side was completed in 2003  . DILATION AND CURETTAGE OF UTERUS  2008   as stated uterine polyps  . ROTATOR CUFF REPAIR Right 3004 and 2010  . TUBAL LIGATION  1999    Prior to Admission medications   Medication Sig Start Date End Date Taking? Authorizing Provider  ALPRAZolam Duanne Moron) 0.5 MG tablet TAKE ONE-HALF TO ONE  TABLET BY MOUTH TWICE DAILY AS NEEDED 07/28/16   Mar Daring, PA-C  atorvastatin (LIPITOR) 20 MG tablet Take 1 tablet (20 mg total) by mouth daily. 05/12/16   Mar Daring, PA-C  clobetasol ointment (TEMOVATE) 0.05 %  10/01/15   Historical Provider, MD  cyclobenzaprine (FLEXERIL) 5 MG tablet Take 1 tablet (5 mg total) by mouth 3 (three) times daily as needed for muscle spasms. 09/18/16   Shantice Menger V Bacon Shloima Clinch, PA-C  dapagliflozin propanediol (FARXIGA) 10 MG TABS tablet Take 10 mg by mouth daily. 05/12/16   Mar Daring, PA-C  doxycycline (VIBRA-TABS) 100 MG tablet Take 1 tablet (100 mg total) by mouth 2 (two) times daily. Patient not taking: Reported on 09/08/2016 07/04/16   Carmon Ginsberg, PA  glipiZIDE (GLUCOTROL) 10 MG tablet TAKE ONE TABLET BY MOUTH TWICE DAILY 30 MINUTES BEFORE A MEAL 05/12/16   Mar Daring, PA-C  nabumetone (RELAFEN) 750 MG tablet Take 1 tablet (750 mg total) by mouth 2 (two) times daily. 09/18/16   Sofie Schendel V Bacon Alfreda Hammad, PA-C  NON FORMULARY CPAP    Historical Provider, MD  ONE TOUCH ULTRA TEST test strip USE ONE STRIP TO CHECK GLUCOSE ONCE DAILY 02/11/16   Margarita Rana, MD  pioglitazone (ACTOS) 45 MG tablet Take 1 tablet (45 mg total) by mouth daily. 08/11/16   Mar Daring, PA-C  sitaGLIPtin (JANUVIA) 100 MG tablet Take 1 tablet (100 mg total) by mouth daily. 05/12/16   Mar Daring, PA-C    Allergies Patient has no known  allergies.  Family History  Problem Relation Age of Onset  . Hyperlipidemia Mother   . Hypertension Mother   . Diabetes Mother   . Diabetes Father   . Hyperlipidemia Father   . Hypertension Father     Social History Social History  Substance Use Topics  . Smoking status: Current Every Day Smoker    Packs/day: 1.00    Types: Cigarettes  . Smokeless tobacco: Never Used  . Alcohol use No    Review of Systems  Constitutional: Negative for fever. Cardiovascular: Negative for chest  pain. Respiratory: Negative for shortness of breath. Musculoskeletal: Negative for back pain. Left shoulder pain as above. Skin: Negative for rash. Neurological: Negative for headaches, focal weakness or numbness. ____________________________________________  PHYSICAL EXAM:  VITAL SIGNS: ED Triage Vitals  Enc Vitals Group     BP 09/18/16 1620 123/73     Pulse Rate 09/18/16 1620 81     Resp 09/18/16 1620 20     Temp 09/18/16 1620 98.5 F (36.9 C)     Temp Source 09/18/16 1620 Oral     SpO2 09/18/16 1620 99 %     Weight 09/18/16 1621 147 lb (66.7 kg)     Height 09/18/16 1621 5\' 3"  (1.6 m)     Head Circumference --      Peak Flow --      Pain Score 09/18/16 1621 6     Pain Loc --      Pain Edu? --      Excl. in [place]? --    Constitutional: Alert and oriented. Well appearing and in no distress. Head: Normocephalic and atraumatic. Cardiovascular: Normal rate, regular rhythm. Normal distal pulses. Respiratory: Normal respiratory effort. No wheezes/rales/rhonchi. Musculoskeletal: Left shoulder without obvious deformity, dislocation, or edema. Normal ROM. Normal rotator cuff testing. Tenderness to palp over the biceps tendon. Negative drop arm. Nontender with normal range of motion in all other extremities.  Neurologic:  Normal gait without ataxia. Normal speech and language. No gross focal neurologic deficits are appreciated. Skin:  Skin is warm, dry and intact. No rash noted. ____________________________________________   RADIOLOGY  Left Shoulder Negative. No acute bony findings.  I, Edahi Kroening, Dannielle Karvonen, personally viewed and evaluated these images (plain radiographs) as part of my medical decision making, as well as reviewing the written report by the radiologist. ____________________________________________  INITIAL IMPRESSION / [place] / ED COURSE  Patient with left shoulder impingement and tendinosis following sprain at work. She has a normal x-ray without  acute injury. She is discharged with prescriptions for Flexeril and Relafen. She is referred to Dr. Sharol Given for further evaluation and management. Work activities as tolerated.   Clinical Course    ____________________________________________  FINAL CLINICAL IMPRESSION(S) / ED DIAGNOSES  Final diagnoses:  Biceps tendinosis of left shoulder  Shoulder tendinitis, left      Melvenia Needles, PA-C 09/18/16 1737    Nance Pear, MD 09/18/16 2105

## 2016-09-18 NOTE — ED Triage Notes (Signed)
Pt has pain in left shoulder.  Pt states she was pulling a box and felt a pop in left shoulder while at work today.

## 2016-10-06 ENCOUNTER — Ambulatory Visit (INDEPENDENT_AMBULATORY_CARE_PROVIDER_SITE_OTHER): Admitting: Orthopedic Surgery

## 2016-10-06 VITALS — Ht 63.0 in | Wt 147.0 lb

## 2016-10-06 DIAGNOSIS — M7542 Impingement syndrome of left shoulder: Secondary | ICD-10-CM | POA: Insufficient documentation

## 2016-10-06 DIAGNOSIS — M25511 Pain in right shoulder: Secondary | ICD-10-CM

## 2016-10-06 DIAGNOSIS — M7541 Impingement syndrome of right shoulder: Secondary | ICD-10-CM | POA: Insufficient documentation

## 2016-10-06 NOTE — Progress Notes (Signed)
Office Visit Note   Patient: Jacqueline Sparks           Date of Birth: [birth year]           MRN: [id number] Visit Date: 10/06/2016              Requested by: Mar Daring, PA-C [address] [place], [place] [id number] PCP: Mar Daring, PA-C   Assessment & Plan: Visit Diagnoses:  1. Impingement syndrome of right shoulder   2. 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 She can do fine manipulation she can do all other activities except for climbing. Patient did have a subacromial injection in her right shoulder last month and will not proceed with an additional injection today. Recommended she discontinue Flexeril and continue the Relafen.  Follow-Up Instructions: Return in about 4 weeks (around 11/03/2016).   Orders:  No orders of the defined types were placed in this encounter.  No orders of the defined types were placed in this encounter.     Procedures: No procedures performed   Clinical Data: No additional findings.   Subjective: Chief Complaint  Patient presents with  . Right Elbow - Pain  . Left Shoulder - Pain    DOI 09/18/16 pt went to the ER for her left shoulder and states that she has filed a work comp claim on this shoulder as well she just has not heard anything back from that yet. Patient states that due to being off for the past few days she has felt better. "have not done anything" and says that this has helped with the pain. She does not bilateral shoulder pain with reaching above her head or behind her back. They did obtain x rays when she went to the ER of the left. She does not have nay questions today.  Patient has undergone 2 arthroscopic surgeries for the right shoulder. Most recent radiographs of the left shoulder obtained on 08/22/2016 are  negative for any bony pathology.  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  Review of Systems   Objective: Vital Signs: Ht 5\' 3"  (1.6 m)   Wt 147 lb (66.7 kg)   BMI 26.04 kg/m   Physical Exam on examination patient is alert oriented no adenopathy well-dressed normal affect normal respiratory effort she has a normal gait. Examination she has full range of motion of both shoulders. Both shoulders have pain with Neer and Hawkins impingement test and drop arm test. Patient has no adhesive capsulitis in either shoulder.  Ortho Exam  Specialty Comments:  No specialty comments available.  Imaging: No results found.   PMFS History: Patient Active Problem List   Diagnosis Date Noted  . Impingement syndrome of left shoulder 10/06/2016  . Impingement syndrome of right shoulder 10/06/2016  . BP (high blood pressure) 11/12/2015  . Abnormal abdominal MRI 11/09/2015  . Abnormal ECG 11/09/2015  . Anxiety 11/09/2015  . Type 2 diabetes mellitus with hyperglycemia ([place]) 11/09/2015  . Diabetes ([place]) 05/14/2015  . Adiposity 01/30/2014  . Avitaminosis D 05/03/2010  . Obstructive apnea 06/09/2007  . Hypercholesteremia 06/09/2007  . Compulsive tobacco user syndrome 06/09/2007  . Renal tubular disorder 05/19/2007  . Diabetes mellitus,  type 2 ([place]) 06/04/2006  . HLD (hyperlipidemia) 06/04/2006  . Apnea, sleep 06/04/2006   Past Medical History:  Diagnosis Date  . Anxiety   . Diabetes mellitus without complication ([place])   . Hyperlipidemia   . Sleep apnea     Family History  Problem Relation Age of Onset  . Hyperlipidemia Mother   . Hypertension Mother   . Diabetes Mother   . Diabetes Father   . Hyperlipidemia Father   . Hypertension Father     Past Surgical History:  Procedure Laterality Date  . CARPAL TUNNEL RELEASE  2001   as staated this was the left side, right side was  completed in 2003  . DILATION AND CURETTAGE OF UTERUS  2008   as stated uterine polyps  . ROTATOR CUFF REPAIR Right 3004 and 2010  . TUBAL LIGATION  1999   Social History   Occupational History  . Not on file.   Social History Main Topics  . Smoking status: Current Every Day Smoker    Packs/day: 1.00    Types: Cigarettes  . Smokeless tobacco: Never Used  . Alcohol use No  . Drug use: No  . Sexual activity: Not on file

## 2016-11-06 ENCOUNTER — Ambulatory Visit (INDEPENDENT_AMBULATORY_CARE_PROVIDER_SITE_OTHER): Payer: Self-pay | Admitting: Orthopedic Surgery

## 2016-11-08 ENCOUNTER — Encounter (INDEPENDENT_AMBULATORY_CARE_PROVIDER_SITE_OTHER): Payer: Self-pay | Admitting: Orthopedic Surgery

## 2016-11-08 ENCOUNTER — Ambulatory Visit (INDEPENDENT_AMBULATORY_CARE_PROVIDER_SITE_OTHER): Admitting: Orthopedic Surgery

## 2016-11-08 DIAGNOSIS — M25511 Pain in right shoulder: Secondary | ICD-10-CM | POA: Diagnosis not present

## 2016-11-08 DIAGNOSIS — M7542 Impingement syndrome of left shoulder: Secondary | ICD-10-CM

## 2016-11-08 DIAGNOSIS — M7541 Impingement syndrome of right shoulder: Secondary | ICD-10-CM

## 2016-11-08 NOTE — Progress Notes (Signed)
Office Visit Note   Patient: Jacqueline Sparks           Date of Birth: 1961/05/17           MRN: [id number] Visit Date: 11/08/2016              Requested by: Mar Daring, PA-C [place], [place] [address] PCP: Mar Daring, PA-C  Chief Complaint  Patient presents with  . Right Shoulder - Pain  . Left Shoulder - Pain    HPI: Patient presents to follow-up with impingement syndrome bilateral shoulder she is status post subacromial injection the right shoulder on 09/08/2016 and patient did have good interval relief. She states that the medication has worn off. She states she still on restricted work and states that her left shoulder has not been approved by [employer]. HPI  Assessment & Plan: Visit Diagnoses:  1. Impingement syndrome of right shoulder   2. Impingement syndrome of left shoulder     Plan: Patient had temporary interval relief from impingement syndrome right shoulder from the subacromial injection. We will have her start physical therapy in [place] on [address] she was given a prescription for shoulder strengthening for internal and external rotation strengthening as well as scapular stabilization strengthening. Patient states that she will bring the postal green sheet by for [place] to complete.  Follow-Up Instructions: Return in about 4 weeks (around 12/06/2016).   Ortho Exam On examination patient is alert oriented no adenopathy well-dressed normal affect normal S2 effort she has a normal gait. Examination she has full range of motion of both shoulders with abduction and flexion to 120. She is external rotation of 90 bilaterally and internal rotation of 70 bilaterally. The biceps tendon is nontender to palpation.  Imaging: No results found.  Orders:  No orders of the defined types were placed in this encounter.  No orders of the defined types were placed in this encounter.    Procedures: No procedures  performed  Clinical Data: No additional findings.  Subjective: Review of Systems  Objective: Vital Signs: There were no vitals taken for this visit.  Specialty Comments:  No specialty comments available.  PMFS History: Patient Active Problem List   Diagnosis Date Noted  . Impingement syndrome of left shoulder 10/06/2016  . Impingement syndrome of right shoulder 10/06/2016  . BP (high blood pressure) 11/12/2015  . Abnormal abdominal MRI 11/09/2015  . Abnormal ECG 11/09/2015  . Anxiety 11/09/2015  . Type 2 diabetes mellitus with hyperglycemia ([place]) 11/09/2015  . Diabetes ([place]) 05/14/2015  . Adiposity 01/30/2014  . Avitaminosis D 05/03/2010  . Obstructive apnea 06/09/2007  . Hypercholesteremia 06/09/2007  . Compulsive tobacco user syndrome 06/09/2007  . Renal tubular disorder 05/19/2007  . Diabetes mellitus, type 2 ([place]) 06/04/2006  . HLD (hyperlipidemia) 06/04/2006  . Apnea, sleep 06/04/2006   Past Medical History:  Diagnosis Date  . Anxiety   . Diabetes mellitus without complication ([place])   . Hyperlipidemia   . Sleep apnea     Family History  Problem Relation Age of Onset  . Hyperlipidemia Mother   . Hypertension Mother   . Diabetes Mother   . Diabetes Father   . Hyperlipidemia Father   . Hypertension Father     Past Surgical History:  Procedure Laterality Date  . CARPAL TUNNEL RELEASE  2001   as staated this was the left side, right side was completed in 2003  . [place] OF UTERUS  2008  as stated uterine polyps  . ROTATOR CUFF REPAIR Right 3004 and 2010  . TUBAL LIGATION  1999   Social History   Occupational History  . Not on file.   Social History Main Topics  . Smoking status: Current Every Day Smoker    Packs/day: 1.00    Types: Cigarettes  . Smokeless tobacco: Never Used  . Alcohol use No  . Drug use: No  . Sexual activity: Not on file

## 2016-11-10 ENCOUNTER — Telehealth (INDEPENDENT_AMBULATORY_CARE_PROVIDER_SITE_OTHER): Payer: Self-pay | Admitting: *Deleted

## 2016-11-10 ENCOUNTER — Ambulatory Visit (INDEPENDENT_AMBULATORY_CARE_PROVIDER_SITE_OTHER): Payer: Federal, State, Local not specified - PPO | Admitting: Physician Assistant

## 2016-11-10 ENCOUNTER — Encounter: Payer: Self-pay | Admitting: Physician Assistant

## 2016-11-10 VITALS — BP 128/60 | HR 78 | Temp 98.5°F | Resp 16 | Wt 165.2 lb

## 2016-11-10 DIAGNOSIS — E7800 Pure hypercholesterolemia, unspecified: Secondary | ICD-10-CM

## 2016-11-10 DIAGNOSIS — I10 Essential (primary) hypertension: Secondary | ICD-10-CM | POA: Diagnosis not present

## 2016-11-10 DIAGNOSIS — E1165 Type 2 diabetes mellitus with hyperglycemia: Secondary | ICD-10-CM

## 2016-11-10 NOTE — Patient Instructions (Signed)
Type 2 Diabetes Mellitus, Diagnosis, Adult Type 2 diabetes (type 2 diabetes mellitus) is a long-term (chronic) disease. It may be caused by one or both of these problems:  Your body does not make enough of a hormone called insulin.  Your body does not react in a normal way to insulin that it makes. Insulin lets sugars (glucose) go into cells in the body. This gives you energy. If you have type 2 diabetes, sugars cannot get into cells. This causes high blood sugar (hyperglycemia). Your doctor will set treatment goals for you. Generally, you should have these blood sugar levels:  Before meals (preprandial): 80-130 mg/dL (4.4-7.2 mmol/L).  After meals (postprandial): below 180 mg/dL (10 mmol/L).  A1c (hemoglobin A1c) level: less than 7%. Follow these instructions at home: Questions to Ask Your Doctor  You may want to ask these questions:  Do I need to meet with a diabetes educator?  Where can I find a support group for people with diabetes?  What equipment will I need to care for myself at home?  What diabetes medicines do I need? When should I take them?  How often do I need to check my blood sugar?  What number can I call if I have questions?  When is my next doctor's visit? General instructions  Take over-the-counter and prescription medicines only as told by your doctor.  Keep all follow-up visits as told by your doctor. This is important. Contact a doctor if:  Your blood sugar is at or above 240 mg/dL (13.3 mmol/L) for 2 days in a row.  You have been sick or have had a fever for 2 days or more and you are not getting better.  You have any of these problems for more than 6 hours:  You cannot eat or drink.  You feel sick to your stomach (nauseous).  You throw up (vomit).  You have watery poop (diarrhea). Get help right away if:  Your blood sugar is lower than 54 mg/dL (3 mmol/L).  You get confused.  You have trouble:  Thinking clearly.  Breathing.  You  have moderate or large ketone levels in your pee (urine). This information is not intended to replace advice given to you by your health care provider. Make sure you discuss any questions you have with your health care provider. Document Released: 07/15/2008 Document Revised: 03/13/2016 Document Reviewed: 11/09/2015 Elsevier Interactive Patient Education  2017 Elsevier Inc.  

## 2016-11-10 NOTE — Progress Notes (Signed)
Patient: Jacqueline Sparks Female    DOB: 1961-08-19   56 y.o.   MRN: [id number] Visit Date: 11/10/2016  Today's Provider: Mar Daring, PA-C   Chief Complaint  Patient presents with  . Follow-up    BP,Diabetes, Hypercholesteremia   Subjective:    HPI  Diabetes Mellitus Type II, Follow-up:   Lab Results  Component Value Date   HGBA1C 10.1 08/11/2016   HGBA1C 9.3 (H) 05/26/2016   HGBA1C 8.6 02/11/2016   Last seen for diabetes 3 months ago.  Management since then includes Actos was added. She reports excellent compliance with treatment. She is not having side effects.  Current symptoms include none and have been stable. Home blood sugar records: 180-120  Episodes of hypoglycemia? no   Current Insulin Regimen:  Weight trend: increasing steadily Prior visit with dietician: no Current diet: in general, a "healthy" diet   Current exercise: walking  ------------------------------------------------------------------------   Hypertension, follow-up:  BP Readings from Last 3 Encounters:  11/10/16 128/60  09/18/16 132/72  08/11/16 130/70    She was last seen for hypertension 3 months ago.  BP at that visit was 130/70. Management since that visit includes none.She reports excellent compliance with treatment. She is not having side effects.  She is exercising. She is adherent to low salt diet.   Outside blood pressures are n/a. She is experiencing dyspnea, she sees the cardiologist. Patient denies chest pain, chest pressure/discomfort, exertional chest pressure/discomfort, fatigue, irregular heart beat, lower extremity edema and palpitations.   Cardiovascular risk factors include diabetes mellitus, dyslipidemia and hypertension.    ------------------------------------------------------------------------    Lipid/Cholesterol, Follow-up:   Last seen for this 3 months ago.  Management since that visit includes none.  Last Lipid Panel:    Component  Value Date/Time   CHOL 173 05/26/2016 1032   TRIG 408 (H) 05/26/2016 1032   HDL 26 (L) 05/26/2016 1032   CHOLHDL 6.7 (H) 05/26/2016 1032   [place] Comment 05/26/2016 1032    She reports good compliance with treatment. She is not having side effects.   Wt Readings from Last 3 Encounters:  11/10/16 165 lb 3.2 oz (74.9 kg)  10/06/16 147 lb (66.7 kg)  09/18/16 147 lb (66.7 kg)    ------------------------------------------------------------------------     No Known Allergies   Current Outpatient Prescriptions:  .  ALPRAZolam (XANAX) 0.5 MG tablet, TAKE ONE-HALF TO ONE TABLET BY MOUTH TWICE DAILY AS NEEDED, Disp: 60 tablet, Rfl: 5 .  atorvastatin (LIPITOR) 20 MG tablet, Take 1 tablet (20 mg total) by mouth daily., Disp: 90 tablet, Rfl: 1 .  clobetasol ointment (TEMOVATE) 0.05 %, , Disp: , Rfl:  .  cyclobenzaprine (FLEXERIL) 5 MG tablet, Take 1 tablet (5 mg total) by mouth 3 (three) times daily as needed for muscle spasms., Disp: 15 tablet, Rfl: 0 .  dapagliflozin propanediol (FARXIGA) 10 MG TABS tablet, Take 10 mg by mouth daily., Disp: 30 tablet, Rfl: 5 .  doxycycline (VIBRA-TABS) 100 MG tablet, Take 1 tablet (100 mg total) by mouth 2 (two) times daily., Disp: 14 tablet, Rfl: 0 .  glipiZIDE (GLUCOTROL) 10 MG tablet, TAKE ONE TABLET BY MOUTH TWICE DAILY 30 MINUTES BEFORE A MEAL, Disp: 60 tablet, Rfl: 5 .  nabumetone (RELAFEN) 750 MG tablet, Take 1 tablet (750 mg total) by mouth 2 (two) times daily., Disp: 30 tablet, Rfl: 0 .  NON FORMULARY, CPAP, Disp: , Rfl:  .  ONE TOUCH ULTRA TEST test strip, USE ONE STRIP TO  CHECK GLUCOSE ONCE DAILY, Disp: 100 each, Rfl: 1 .  pioglitazone (ACTOS) 45 MG tablet, Take 1 tablet (45 mg total) by mouth daily., Disp: 30 tablet, Rfl: 5 .  sitaGLIPtin (JANUVIA) 100 MG tablet, Take 1 tablet (100 mg total) by mouth daily., Disp: 30 tablet, Rfl: 5  Review of Systems  Constitutional: Negative.   Respiratory: Negative.   Cardiovascular: Negative.     Gastrointestinal: Negative.   Musculoskeletal: Positive for arthralgias (right shoulder; followed by Dr. Sharol Given).  Neurological: Negative.   Psychiatric/Behavioral: Negative.     Social History  Substance Use Topics  . Smoking status: Current Every Day Smoker    Packs/day: 1.00    Types: Cigarettes  . Smokeless tobacco: Never Used  . Alcohol use No   Objective:   BP 128/60 (BP Location: Right Arm, Patient Position: Sitting, Cuff Size: Normal)   Pulse 78   Temp 98.5 F (36.9 C) (Oral)   Resp 16   Wt 165 lb 3.2 oz (74.9 kg)   BMI 29.26 kg/m   Physical Exam  Constitutional: She appears well-developed and well-nourished. No distress.  Neck: Normal range of motion. Neck supple. No tracheal deviation present. No thyromegaly present.  Cardiovascular: Normal rate, regular rhythm and normal heart sounds.  Exam reveals no gallop and no friction rub.   No murmur heard. Pulmonary/Chest: Effort normal and breath sounds normal. No respiratory distress. She has no wheezes. She has no rales.  Musculoskeletal: She exhibits no edema.  Lymphadenopathy:    She has no cervical adenopathy.  Skin: She is not diaphoretic.  Vitals reviewed.      Assessment & Plan:     1. Essential hypertension Stable. Will check labs as below and f/u pending results. I will see her back in 3 months for CPE. - Comprehensive metabolic panel - CBC with Differential/Platelet  2. Type 2 diabetes mellitus with hyperglycemia, unspecified long term insulin use status (HCC) Uncontrolled. Patient did have cortisone injection in Nov 2017 for epicondylitis. Continue Januvia 100mg , Farxiga 10mg , Glipizide 10mg  and Actos 45mg . Will check labs as below and f/u pending results. - Hemoglobin A1c - Comprehensive metabolic panel - CBC with Differential/Platelet  3. Hypercholesteremia Stable. Continue Atorvastatin 20mg . Will check labs as below and f/u pending results. - Lipid panel - Comprehensive metabolic panel  The  entirety of the information documented in the History of Present Illness, Review of Systems and Physical Exam were personally obtained by me. Portions of this information were initially documented by Lyndel Pleasure, CMA and reviewed by me for thoroughness and accuracy.      Mar Daring, PA-C  [facility]

## 2016-11-10 NOTE — Telephone Encounter (Signed)
Pt called about WC paperwork she brought in to see if these were finished yet. Also pt said she got a RX for 4 weeks of therapy, pt went to [facility] and they said they needed more info since It was a WC claim [phone]

## 2016-11-17 ENCOUNTER — Ambulatory Visit: Payer: Federal, State, Local not specified - PPO | Attending: Orthopedic Surgery

## 2016-11-17 DIAGNOSIS — M25512 Pain in left shoulder: Secondary | ICD-10-CM | POA: Diagnosis not present

## 2016-11-17 DIAGNOSIS — M25511 Pain in right shoulder: Secondary | ICD-10-CM | POA: Diagnosis not present

## 2016-11-17 DIAGNOSIS — G8929 Other chronic pain: Secondary | ICD-10-CM | POA: Diagnosis not present

## 2016-11-17 DIAGNOSIS — M6281 Muscle weakness (generalized): Secondary | ICD-10-CM | POA: Diagnosis not present

## 2016-11-17 NOTE — Therapy (Signed)
[facility] [address] [place], [address] Phone: [phone]   Fax:  [phone]  Physical Therapy Evaluation  Patient Details  Name: Jacqueline Sparks MRN: [id number] Date of Birth: 02/17/1961 Referring Provider: Meridee Score, MD  Encounter Date: 11/17/2016      PT End of Session - 11/17/16 1353    Visit Number 1   Number of Visits 9   Date for PT Re-Evaluation 12/18/16   PT Start Time [id number]  pt filling out paperwork   PT Stop Time 1446   PT Time Calculation (min) 53 min   Activity Tolerance Patient tolerated treatment well   Behavior During Therapy [facility] for tasks assessed/performed      Past Medical History:  Diagnosis Date  . Anxiety   . Bronchitis   . Diabetes mellitus without complication ([place])   . Hyperlipidemia   . Sleep apnea     Past Surgical History:  Procedure Laterality Date  . CARPAL TUNNEL RELEASE  2001   as staated this was the left side, right side was completed in 2003  . DILATION AND CURETTAGE OF UTERUS  2008   as stated uterine polyps  . ROTATOR CUFF REPAIR Right 3004 and 2010  . TUBAL LIGATION  1999    There were no vitals filed for this visit.       Subjective Assessment - 11/17/16 1358    Subjective R shoulder pain: 2/10 current and best, 9/10 at most for the past 2 months (usually after work or during work). L shoulder pain: 2/10 current, 9/10 at most for the past 2 months.    Pertinent History Bilateral shoulder pain R > L. Pt states having R rotator cuff surgery 2x (2nd surgery was around 2012, first surgery was around 2010) which helped for a while. Pt went for her regular physical in April 2017 and told her doctor that her L shoulder is bothering her. Her doctor told her that her L shoulder is impinged.   Pt states ther her R shoulder was injured again at work in 2012 when pt was cleaning with her R arm (which involved raising her R arm). 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 Had cortisone shots which helped for a month or 2. Pt states that her R shoulder does not feel 100% since the last surgery. States that she constantly taking cortisone shots (last shot was in November 2017).  Pt was sent to PT after her shots were not really working. This is the 3rd time she has PT for her R shoulder. Per pt, the therapy helped but nothing seems to last.  Unable to do her T-band HEP due to pain.  Pt states that her L shoulder pain might have been due to favoring her R shoulder. 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    Patient Stated Goals Have better motion, less pain.    Currently in Pain? Yes   Pain Score 2    Pain Location Shoulder   Pain Orientation Left;Right   Pain Descriptors / Indicators Aching;Stabbing   Pain Type Chronic pain   Pain Onset More than a month ago   Pain Frequency Occasional   Aggravating Factors  bringing her arm to  the side (abduction), back, reaching back, donning and doffing her coat or shirt, turning the stearing wheel when driving.    Pain Relieving Factors heat pad, flexeril, pain medication            [facility] PT Assessment - 11/17/16 1415      Assessment   Medical Diagnosis R shoulder impingement   Referring Provider Meridee Score, MD   Onset Date/Surgical Date 11/08/16  date PT referral signed. Chronic condition.    Prior Therapy Pt had prior PT for R shoulder following rotator cuff surgery which helped.      Precautions   Precaution Comments no  known precautions     Restrictions   Other Position/Activity Restrictions no known restrictions     Balance Screen   Has the patient fallen in the past 6 months No   Has the patient had a decrease in activity level because of a fear of falling?  No    Is the patient reluctant to leave their home because of a fear of falling?  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 (+) Michel Bickers, and Neer's, and pain with empty can L shoulder; Slight increase in R shoulder discomfort with gentle distraction. Decreased discomfort with gentle compression (long axis). Increased discomfort with lateral distraction, decreased discomfort with medial compression of R shoulder.   Decreased symptoms with ER AROM with long axis compression to R shoulder.    Quick DASH  47.72%     Posture/Postural Control   Posture Comments bilaterally protracted shoulders and neck     AROM   Overall AROM Comments Shoulder pain bilaterally with cervical AROM but did not reproduce her shoulder symptoms.    Right Shoulder Flexion 125 Degrees  with pain, 135 deg AAROM   Right Shoulder ABduction 68 Degrees  with pain, 89 degrees AAROM with pain   Left Shoulder Flexion 130 Degrees  with pain, 140 degrees AAROM with pain   Left Shoulder ABduction 87 Degrees  with pain, 107 degrees AAROM with pain     Strength   Right Shoulder Flexion 4/5   Right Shoulder ABduction 4/5   Right Shoulder External Rotation 4-/5   Left Shoulder Flexion 4/5   Left Shoulder ABduction 4/5   Left Shoulder External Rotation 4/5     Palpation   Palpation comment TTP R posterior and anterior shoulder. Slight decreased inferior R shoulder joint mobility. TTP L posterior shoulder with P to A. Decreased L shoulder discomfort with long axis compression. Discomfort with long axis  distraction, lateral distraction. Slight discomfort with L shoulder medial compression.                            PT Education - 11/17/16 1832    Education provided Yes   Education Details plan of care   Person(s) Educated Patient   Methods Explanation;Demonstration;Tactile cues;Verbal cues   Comprehension Returned demonstration;Verbalized understanding  PT Long Term Goals - 11/17/16 1804      PT LONG TERM GOAL #1   Title Patient will have a decrease in R and L shoulder pain to 4/10 or less at most to promote ability to reach, don and doff shirts and coats, and perform work related tasks.    Baseline 9/10 R and L shoulder pain at most (11/17/2016)   Time 4   Period Weeks   Status New     PT LONG TERM GOAL #2   Title Patient will improve her Quick Dash Disability/Symptom score by at least 15% as a demonstration of improved function.    Baseline 47.72% (11/17/2016)   Time 4   Period Weeks   Status New     PT LONG TERM GOAL #3   Title Patient will improve her R shoulder flexion and abduction AROM by at least 10 degrees to promote ability to raise her R arm, and reach.    Baseline R shoulder flexion 125 degrees, abduction 68 degrees (11/17/2016)   Time 4   Period Weeks   Status New     PT LONG TERM GOAL #4   Title Patient will improve her L shoulder flexion and abduction AROM by at least 10 degrees to promote ability to raise her arm and reach.   Baseline L shoulder flexion 130 degrees, abduction 87 degrees (11/17/2016)   Time 4   Period Weeks   Status New               Plan - 11/17/16 1757    Clinical Impression Statement Pt is a 56 year old female who came to physical therapy secondary to bilateral shoulder pain, R > L. She also presents with bilaterally protracted shoulders and neck posture,  reproduction of symptoms with shoulder flexion and abduction AROM, weakness, TTP, positive special tests suggesting impingement bilateral  shoulders, and difficulty performing functional tasks such as raising her arms, reaching, or donning and doffing shirts or coats. Patient will benefit from skilled physical therapy services to address the aforementioned deficits.    Rehab Potential Fair   Clinical Impairments Affecting Rehab Potential Chronicity of condition   PT Frequency 2x / week   PT Duration 4 weeks   PT Treatment/Interventions Therapeutic exercise;Therapeutic activities;Neuromuscular re-education;Patient/family education;Manual techniques;Dry needling;Electrical Stimulation;Iontophoresis 4mg /ml Dexamethasone;Ultrasound;Aquatic Therapy   PT Next Visit Plan stability exercises, ER strengthening, scapular strengthening, manual techniques, modialities PRN   Consulted and Agree with Plan of Care Patient      Patient will benefit from skilled therapeutic intervention in order to improve the following deficits and impairments:  Pain, Improper body mechanics, Decreased strength, Decreased range of motion  Visit Diagnosis: Chronic right shoulder pain - Plan: PT plan of care cert/re-cert  Muscle weakness (generalized) - Plan: PT plan of care cert/re-cert  Chronic left shoulder pain - Plan: PT plan of care cert/re-cert     Problem List Patient Active Problem List   Diagnosis Date Noted  . Impingement syndrome of left shoulder 10/06/2016  . Impingement syndrome of right shoulder 10/06/2016  . BP (high blood pressure) 11/12/2015  . Abnormal abdominal MRI 11/09/2015  . Abnormal ECG 11/09/2015  . Anxiety 11/09/2015  . Type 2 diabetes mellitus with hyperglycemia ([place]) 11/09/2015  . Diabetes ([place]) 05/14/2015  . Adiposity 01/30/2014  . Avitaminosis D 05/03/2010  . Obstructive apnea 06/09/2007  . Hypercholesteremia 06/09/2007  . Compulsive tobacco user syndrome 06/09/2007  . Renal tubular disorder 05/19/2007  . Diabetes mellitus, type 2 ([place]) 06/04/2006  .  HLD (hyperlipidemia) 06/04/2006  . Apnea, sleep 06/04/2006    Joneen Boers PT, DPT   11/17/2016, 6:38 PM  [facility] [address], [place], [address] Phone: [phone]   Fax:  [phone]  Name: Jacqueline Sparks MRN: [id number] Date of Birth: 1960/12/14

## 2016-11-19 ENCOUNTER — Ambulatory Visit: Payer: Federal, State, Local not specified - PPO

## 2016-11-19 DIAGNOSIS — M25511 Pain in right shoulder: Secondary | ICD-10-CM | POA: Diagnosis not present

## 2016-11-19 DIAGNOSIS — G8929 Other chronic pain: Secondary | ICD-10-CM | POA: Diagnosis not present

## 2016-11-19 DIAGNOSIS — M6281 Muscle weakness (generalized): Secondary | ICD-10-CM | POA: Diagnosis not present

## 2016-11-19 DIAGNOSIS — M25512 Pain in left shoulder: Secondary | ICD-10-CM

## 2016-11-19 NOTE — Therapy (Signed)
[facility] [address] [place], [address] Phone: [phone]   Fax:  [phone]  Physical Therapy Treatment  Patient Details  Name: Jacqueline Sparks MRN: [id number] Date of Birth: 1961/06/09 Referring Provider: Meridee Score, MD  Encounter Date: 11/19/2016      PT End of Session - 11/19/16 1747    Visit Number 2   Number of Visits 9   Date for PT Re-Evaluation 12/18/16   PT Start Time [id number]   PT Stop Time 1828   PT Time Calculation (min) 41 min   Activity Tolerance Patient tolerated treatment well   Behavior During Therapy [facility] for tasks assessed/performed      Past Medical History:  Diagnosis Date  . Anxiety   . Bronchitis   . Diabetes mellitus without complication ([employer])   . Hyperlipidemia   . Sleep apnea     Past Surgical History:  Procedure Laterality Date  . CARPAL TUNNEL RELEASE  2001   as staated this was the left side, right side was completed in 2003  . DILATION AND CURETTAGE OF UTERUS  2008   as stated uterine polyps  . ROTATOR CUFF REPAIR Right 3004 and 2010  . TUBAL LIGATION  1999    There were no vitals filed for this visit.      Subjective Assessment - 11/19/16 1749    Subjective R shoulder is a 5/10, L is 3/10 currently. Just finished a 10 hour day.    Pertinent History Bilateral shoulder pain R > L. Pt states having R rotator cuff surgery 2x (2nd surgery was around 2012, first surgery was around 2010) which helped for a while. Pt went for her regular physical in April 2017 and told her doctor that her L shoulder is bothering her. Her doctor told her that her L shoulder is impinged.   Pt states ther her R shoulder was injured again at work in 2012 when pt was cleaning with her R arm (which involved raising her R arm). 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 Had cortisone shots which helped for a month  or 2. Pt states that her R shoulder does not feel 100% since the last surgery. States that she constantly taking cortisone shots (last shot was in November 2017).  Pt was sent to PT after her shots were not really working. This is the 3rd time she has PT for her R shoulder. Per pt, the therapy helped but nothing seems to last.  Unable to do her T-band HEP due to pain.  Pt states that her L shoulder pain might have been due to favoring her R shoulder. 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    Patient Stated Goals Have better motion, less pain.    Currently in Pain? Yes   Pain Score 5    Pain Onset More than a month ago                                 PT Education - 11/19/16 1833    Education provided Yes   Education Details ther-ex   [employer]) Educated Patient   Methods Explanation;Demonstration;Tactile cues;Verbal cues   Comprehension  Verbalized understanding;Returned demonstration        Objectives   There-ex    Directed patient with seated bilateral shoulder ER with elbows on table (for axial loading at joint) 5x. Symptoms during return motion   Then 5x with yellow band. Symptoms with return motion  Standing R shoulder ER resisting yellow band with manual axial compression to shoulder 5x3. No pain.  Standing L shoulder ER resisting yellow band with manual axial compression to shoulder 5x3. No pain  Wall push-ups 5x3   Towel slides flexion  R 10x  L 5x. L shoulder burning sensation  Standing wall push up position:   R and L weight shifting on hands. 10x5 seconds each side.   Standing table pushup position: R weight shifting onto hand 10 x 0-5 second holds  L weight shifting 5x5 second holds. L shoulder discomfort.    Standing arm gentle swings 10x2. Decreased pain. Some difficulty secondary to  bulging discs in low back per pt.   Standing wall push-up position:  Gentle manual perturbation from PT to promote closed chain stability at shoulders 5x10 seconds for 2 sets   Improved exercise technique, movement at target joints, use of target muscles after mod verbal, visual, tactile cues.     Bilateral shoulders worked on secondary to pt needing both arms to perform her repetitive work duties. Able to perform exercises more comfortably when in closed chain positions. Decreased R shoulder pain to 4/10 after session. Decreased bilateral shoulder pain after standing gentle arm swings. Pt also demonstrates fatigue secondary to her long work day.          PT Long Term Goals - 11/17/16 1804      PT LONG TERM GOAL #1   Title Patient will have a decrease in R and L shoulder pain to 4/10 or less at most to promote ability to reach, don and doff shirts and coats, and perform work related tasks.    Baseline 9/10 R and L shoulder pain at most (11/17/2016)   Time 4   Period Weeks   Status New     PT LONG TERM GOAL #2   Title Patient will improve her Quick Dash Disability/Symptom score by at least 15% as a demonstration of improved function.    Baseline 47.72% (11/17/2016)   Time 4   Period Weeks   Status New     PT LONG TERM GOAL #3   Title Patient will improve her R shoulder flexion and abduction AROM by at least 10 degrees to promote ability to raise her R arm, and reach.    Baseline R shoulder flexion 125 degrees, abduction 68 degrees (11/17/2016)   Time 4   Period Weeks   Status New     PT LONG TERM GOAL #4   Title Patient will improve her L shoulder flexion and abduction AROM by at least 10 degrees to promote ability to raise her arm and reach.   Baseline L shoulder flexion 130 degrees, abduction 87 degrees (11/17/2016)   Time 4   Period Weeks   Status New               Plan - 11/19/16 1834    Clinical Impression Statement Bilateral shoulders worked on secondary to pt  needing both arms to perform her repetitive work duties. Able to perform exercises more comfortably when in closed chain positions. Decreased R shoulder pain to 4/10 after session. Decreased bilateral shoulder pain after standing gentle arm swings. Pt also demonstrates fatigue  secondary to her long work day.   Rehab Potential Fair   Clinical Impairments Affecting Rehab Potential Chronicity of condition   PT Frequency 2x / week   PT Duration 4 weeks   PT Treatment/Interventions Therapeutic exercise;Therapeutic activities;Neuromuscular re-education;Patient/family education;Manual techniques;Dry needling;Electrical Stimulation;Iontophoresis 4mg /ml Dexamethasone;Ultrasound;Aquatic Therapy   PT Next Visit Plan stability exercises, ER strengthening, scapular strengthening, manual techniques, modialities PRN   Consulted and Agree with Plan of Care Patient      Patient will benefit from skilled therapeutic intervention in order to improve the following deficits and impairments:  Pain, Improper body mechanics, Decreased strength, Decreased range of motion  Visit Diagnosis: Chronic right shoulder pain  Muscle weakness (generalized)  Chronic left shoulder pain     Problem List Patient Active Problem List   Diagnosis Date Noted  . Impingement syndrome of left shoulder 10/06/2016  . Impingement syndrome of right shoulder 10/06/2016  . BP (high blood pressure) 11/12/2015  . Abnormal abdominal MRI 11/09/2015  . Abnormal ECG 11/09/2015  . Anxiety 11/09/2015  . Type 2 diabetes mellitus with hyperglycemia ([place]) 11/09/2015  . Diabetes ([place]) 05/14/2015  . Adiposity 01/30/2014  . Avitaminosis D 05/03/2010  . Obstructive apnea 06/09/2007  . Hypercholesteremia 06/09/2007  . Compulsive tobacco user syndrome 06/09/2007  . Renal tubular disorder 05/19/2007  . Diabetes mellitus, type 2 ([place]) 06/04/2006  . HLD (hyperlipidemia) 06/04/2006  . Apnea, sleep 06/04/2006    Joneen Boers PT, DPT   11/19/2016, 6:45 PM  [facility] [address], [place], [address] Phone: [phone]   Fax:  [phone]  Name: Jacqueline Sparks MRN: [id number] Date of Birth: 03-27-1961

## 2016-11-24 ENCOUNTER — Ambulatory Visit: Payer: Federal, State, Local not specified - PPO | Attending: Orthopedic Surgery

## 2016-11-24 ENCOUNTER — Telehealth: Payer: Self-pay

## 2016-11-24 DIAGNOSIS — M6281 Muscle weakness (generalized): Secondary | ICD-10-CM | POA: Insufficient documentation

## 2016-11-24 DIAGNOSIS — G8929 Other chronic pain: Secondary | ICD-10-CM | POA: Insufficient documentation

## 2016-11-24 DIAGNOSIS — I10 Essential (primary) hypertension: Secondary | ICD-10-CM | POA: Diagnosis not present

## 2016-11-24 DIAGNOSIS — E1165 Type 2 diabetes mellitus with hyperglycemia: Secondary | ICD-10-CM | POA: Diagnosis not present

## 2016-11-24 DIAGNOSIS — M25511 Pain in right shoulder: Secondary | ICD-10-CM | POA: Insufficient documentation

## 2016-11-24 DIAGNOSIS — E7800 Pure hypercholesterolemia, unspecified: Secondary | ICD-10-CM | POA: Diagnosis not present

## 2016-11-24 DIAGNOSIS — M25512 Pain in left shoulder: Secondary | ICD-10-CM | POA: Insufficient documentation

## 2016-11-24 NOTE — Telephone Encounter (Signed)
No show. Called patient  who said that she could not make it. Pt said that she will be able to come to her next follow up session.

## 2016-11-25 ENCOUNTER — Telehealth: Payer: Self-pay

## 2016-11-25 LAB — CBC WITH DIFFERENTIAL/PLATELET
BASOS ABS: 0 10*3/uL (ref 0.0–0.2)
Basos: 1 %
EOS (ABSOLUTE): 0.2 10*3/uL (ref 0.0–0.4)
EOS: 2 %
HEMATOCRIT: 41.4 % (ref 34.0–46.6)
HEMOGLOBIN: 13.8 g/dL (ref 11.1–15.9)
IMMATURE GRANS (ABS): 0 10*3/uL (ref 0.0–0.1)
Immature Granulocytes: 0 %
LYMPHS ABS: 4.1 10*3/uL — AB (ref 0.7–3.1)
LYMPHS: 48 %
MCH: 27.9 pg (ref 26.6–33.0)
MCHC: 33.3 g/dL (ref 31.5–35.7)
MCV: 84 fL (ref 79–97)
MONOCYTES: 4 %
Monocytes Absolute: 0.4 10*3/uL (ref 0.1–0.9)
NEUTROS ABS: 3.9 10*3/uL (ref 1.4–7.0)
Neutrophils: 45 %
PLATELETS: 375 10*3/uL (ref 150–379)
RBC: 4.95 x10E6/uL (ref 3.77–5.28)
RDW: 15.6 % — ABNORMAL HIGH (ref 12.3–15.4)
WBC: 8.6 10*3/uL (ref 3.4–10.8)

## 2016-11-25 LAB — COMPREHENSIVE METABOLIC PANEL
ALBUMIN: 4.1 g/dL (ref 3.5–5.5)
ALT: 14 IU/L (ref 0–32)
AST: 16 IU/L (ref 0–40)
Albumin/Globulin Ratio: 1.3 (ref 1.2–2.2)
Alkaline Phosphatase: 86 IU/L (ref 39–117)
BUN / CREAT RATIO: 8 — AB (ref 9–23)
BUN: 6 mg/dL (ref 6–24)
CALCIUM: 9.2 mg/dL (ref 8.7–10.2)
CHLORIDE: 103 mmol/L (ref 96–106)
CO2: 26 mmol/L (ref 18–29)
Creatinine, Ser: 0.75 mg/dL (ref 0.57–1.00)
GFR, EST AFRICAN AMERICAN: 105 mL/min/{1.73_m2} (ref >59)
GFR, EST NON AFRICAN AMERICAN: 91 mL/min/{1.73_m2} (ref >59)
GLUCOSE: 129 mg/dL — AB (ref 65–99)
Globulin, Total: 3.2 g/dL (ref 1.5–4.5)
Potassium: 4.5 mmol/L (ref 3.5–5.2)
Sodium: 144 mmol/L (ref 134–144)
TOTAL PROTEIN: 7.3 g/dL (ref 6.0–8.5)

## 2016-11-25 LAB — LIPID PANEL
CHOL/HDL RATIO: 5.6 ratio — AB (ref 0.0–4.4)
Cholesterol, Total: 162 mg/dL (ref 100–199)
HDL: 29 mg/dL — AB (ref >39)
TRIGLYCERIDES: 405 mg/dL — AB (ref 0–149)

## 2016-11-25 LAB — HEMOGLOBIN A1C
ESTIMATED AVERAGE GLUCOSE: 166 mg/dL
HEMOGLOBIN A1C: 7.4 % — AB (ref 4.8–5.6)

## 2016-11-25 NOTE — Telephone Encounter (Signed)
No answer, Mailbox is full.  Thanks,  -Lilygrace Rodick

## 2016-11-25 NOTE — Telephone Encounter (Signed)
Patient advised as directed below.  Thanks,  -Joseline 

## 2016-11-25 NOTE — Telephone Encounter (Signed)
-----   Message from Mar Daring, PA-C sent at 11/25/2016  1:19 PM EST ----- A1c has improved drastically to 7.4 from 10.1. Continue current medication regimen. Cholesterol has improved as well but triglycerides remain at 400. I do think we could increase the atorvastatin to see if that helps triglycerides (some studies show that increasing atorvastatin to a higher dose can lower triglycerides by 50%). If not interested, or intolerant to the increase due to muscle aches, you could add omega 3 (fish oil or mega red). Another option would be to add a class of medication to the statin to lower cholesterol. Please let me know your choice.  All other labs were stable.

## 2016-11-26 ENCOUNTER — Ambulatory Visit: Payer: Federal, State, Local not specified - PPO

## 2016-11-26 DIAGNOSIS — M25511 Pain in right shoulder: Secondary | ICD-10-CM | POA: Diagnosis not present

## 2016-11-26 DIAGNOSIS — G8929 Other chronic pain: Secondary | ICD-10-CM

## 2016-11-26 DIAGNOSIS — M6281 Muscle weakness (generalized): Secondary | ICD-10-CM

## 2016-11-26 DIAGNOSIS — M25512 Pain in left shoulder: Secondary | ICD-10-CM

## 2016-11-26 NOTE — Therapy (Signed)
[facility] [address], [place], [address] Phone: [phone]   Fax:  [phone]  Physical Therapy Treatment  Patient Details  Name: Jacqueline Sparks MRN: [id number] Date of Birth: 1960-12-20 Referring Provider: Meridee Score, MD  Encounter Date: 11/26/2016      PT End of Session - 11/26/16 1747    Visit Number 3   Number of Visits 9   Date for PT Re-Evaluation 12/18/16   PT Start Time [id number]   PT Stop Time 1831   PT Time Calculation (min) 44 min   Activity Tolerance Patient tolerated treatment well   Behavior During Therapy [facility] for tasks assessed/performed      Past Medical History:  Diagnosis Date  . Anxiety   . Bronchitis   . Diabetes mellitus without complication ([place])   . Hyperlipidemia   . Sleep apnea     Past Surgical History:  Procedure Laterality Date  . CARPAL TUNNEL RELEASE  2001   as staated this was the left side, right side was completed in 2003  . DILATION AND CURETTAGE OF UTERUS  2008   as stated uterine polyps  . ROTATOR CUFF REPAIR Right 3004 and 2010  . TUBAL LIGATION  1999    There were no vitals filed for this visit.      Subjective Assessment - 11/26/16 1748    Subjective Both shoulders feel pretty good today. No pain today. Gets a little spasm but not pain pain.  Took 800 mg Ibuprofen for her toothache since 2 days ago but has been taking it for her shoulder for months.  Shoulders were pretty good Monday, and Tuesday.  Also had a light day of work [employer]    Pertinent History Bilateral shoulder pain R > L. Pt states having R rotator cuff surgery 2x (2nd surgery was around 2012, first surgery was around 2010) which helped for a while. Pt went for her regular physical in April 2017 and told her doctor that her L shoulder is bothering her. Her doctor told her that her L shoulder is impinged.   Pt states ther her R shoulder was injured again at work in 2012 when pt was cleaning with  her R arm (which involved raising her R arm). 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 Had cortisone shots which helped for a month or 2. Pt states that her R shoulder does not feel 100% since the last surgery. States that she constantly taking cortisone shots (last shot was in November 2017).  Pt was sent to PT after her shots were not really working. This is the 3rd time she has PT for her R shoulder. Per pt, the therapy helped but nothing seems to last.  Unable to do her T-band HEP due to pain.  Pt states that her L shoulder pain might have been due to favoring her R shoulder. 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    Patient Stated Goals Have better motion, less pain.    Currently in Pain? No/denies   Pain Score 0-No pain   Pain Onset More than a month ago            [facility] PT Assessment - 11/26/16 1814  AROM   Overall AROM Comments pt adds taking medication for her tooth ache 30 min earlier but shoulders were not bad overall even before that.    Right Shoulder Flexion 133 Degrees  with discomfort   Right Shoulder ABduction 110 Degrees  with discomfort   Left Shoulder Flexion 120 Degrees  with a little discomfort   Left Shoulder ABduction 93 Degrees  with discomfort                             PT Education - 11/26/16 1805    Education provided Yes   Education Details ther-ex   [employer]) Educated Patient   Methods Explanation;Demonstration;Tactile cues;Verbal cues   Comprehension Verbalized understanding;Returned demonstration        Objectives   There-ex  Directed patient with ER isometrics in neutral with manual axial compression and A to P pressure to humeral head from PT 10x5 seconds each shoulder for 3 sets.  No pain with exercise with added A to P pressure  IR isometrics with manual axial compression to shoulder 10x5 seconds for 2 sets  Increased time with aforementioned exercises secondary to both shoulders being worked out individually.     Seated R and L shoulder flexion and abduction AROM 1-2x each for each UE. Reviewed current status with AROM with pt.   Seated with elbows propped on table: bilateral shoulder IR isometrics using PVC rod, palms up 10x5 seconds. Slight anterior shoulder discomfort which eases with rest.   Wall push-ups 5x3  Standing wall push up position:              R and L weight shifting on hands. 4x5 seconds each side. Discomfort which eases with rest.      Improved exercise technique, movement at target joints, use of target muscles after mod verbal, visual, tactile cues.     Pt demonstrates improved bilateral shoulder abduction and R shoulder flexion AROM with less pain since last measured. Per pt, she took 800 mg ibuprofen for her toothache earlier but usually takes her medication for her shoulder pain for the past few months. Possible effect of medication playing a factor but may be minimal due to pt already consistently taking it even before PT initial evaluation started. Continue with shoulder strengthening with gentle axial pressure and A to P pressure to humeral head when appropriate.           PT Long Term Goals - 11/17/16 1804      PT LONG TERM GOAL #1   Title Patient will have a decrease in R and L shoulder pain to 4/10 or less at most to promote ability to reach, don and doff shirts and coats, and perform work related tasks.    Baseline 9/10 R and L shoulder pain at most (11/17/2016)   Time 4   Period Weeks   Status New     PT LONG TERM GOAL #2   Title Patient will improve her Quick Dash Disability/Symptom score by at least 15% as a demonstration of improved function.    Baseline 47.72% (11/17/2016)   Time 4   Period Weeks   Status New      PT LONG TERM GOAL #3   Title Patient will improve her R shoulder flexion and abduction AROM by at least 10 degrees to promote ability to raise her R arm, and reach.    Baseline R shoulder flexion 125 degrees, abduction 68 degrees (11/17/2016)  Time 4   Period Weeks   Status New     PT LONG TERM GOAL #4   Title Patient will improve her L shoulder flexion and abduction AROM by at least 10 degrees to promote ability to raise her arm and reach.   Baseline L shoulder flexion 130 degrees, abduction 87 degrees (11/17/2016)   Time 4   Period Weeks   Status New               Plan - 11/26/16 1825    Clinical Impression Statement Pt demonstrates improved bilateral shoulder abduction and R shoulder flexion AROM with less pain since last measured. Per pt, she took 800 mg ibuprofen for her toothache earlier but usually takes her medication for her shoulder pain for the past few months. Possible effect of medication playing a factor but may be minimal due to pt already consistently taking it even before PT initial evaluation started. Continue with shoulder strengthening with gentle axial pressure and A to P pressure to humeral head when appropriate.    Rehab Potential Fair   Clinical Impairments Affecting Rehab Potential Chronicity of condition   PT Frequency 2x / week   PT Duration 4 weeks   PT Treatment/Interventions Therapeutic exercise;Therapeutic activities;Neuromuscular re-education;Patient/family education;Manual techniques;Dry needling;Electrical Stimulation;Iontophoresis 4mg /ml Dexamethasone;Ultrasound;Aquatic Therapy   PT Next Visit Plan stability exercises, ER strengthening, scapular strengthening, manual techniques, modialities PRN   Consulted and Agree with Plan of Care Patient      Patient will benefit from skilled therapeutic intervention in order to improve the following deficits and impairments:  Pain, Improper body mechanics, Decreased strength, Decreased range of  motion  Visit Diagnosis: Chronic right shoulder pain  Muscle weakness (generalized)  Chronic left shoulder pain     Problem List Patient Active Problem List   Diagnosis Date Noted  . Impingement syndrome of left shoulder 10/06/2016  . Impingement syndrome of right shoulder 10/06/2016  . BP (high blood pressure) 11/12/2015  . Abnormal abdominal MRI 11/09/2015  . Abnormal ECG 11/09/2015  . Anxiety 11/09/2015  . Type 2 diabetes mellitus with hyperglycemia ([place]) 11/09/2015  . Diabetes ([place]) 05/14/2015  . Adiposity 01/30/2014  . Avitaminosis D 05/03/2010  . Obstructive apnea 06/09/2007  . Hypercholesteremia 06/09/2007  . Compulsive tobacco user syndrome 06/09/2007  . Renal tubular disorder 05/19/2007  . Diabetes mellitus, type 2 ([place]) 06/04/2006  . HLD (hyperlipidemia) 06/04/2006  . Apnea, sleep 06/04/2006    Joneen Boers PT, DPT   11/26/2016, 6:41 PM  [facility] [address] [place], [address] Phone: [phone]   Fax:  [phone]  Name: Roslynn Becks MRN: [id number] Date of Birth: 06/04/1961

## 2016-12-01 ENCOUNTER — Ambulatory Visit: Payer: Federal, State, Local not specified - PPO

## 2016-12-01 DIAGNOSIS — M25512 Pain in left shoulder: Secondary | ICD-10-CM | POA: Diagnosis not present

## 2016-12-01 DIAGNOSIS — M6281 Muscle weakness (generalized): Secondary | ICD-10-CM

## 2016-12-01 DIAGNOSIS — G8929 Other chronic pain: Secondary | ICD-10-CM | POA: Diagnosis not present

## 2016-12-01 DIAGNOSIS — M25511 Pain in right shoulder: Principal | ICD-10-CM

## 2016-12-01 NOTE — Therapy (Signed)
[facility] [address] [place], [address] Phone: [phone]   Fax:  [phone]  Physical Therapy Treatment  Patient Details  Name: Jacqueline Sparks MRN: [id number] Date of Birth: September 03, 1961 Referring Provider: Meridee Score, MD  Encounter Date: 12/01/2016      PT End of Session - 12/01/16 1745    Visit Number 4   Number of Visits 9   Date for PT Re-Evaluation 12/18/16   PT Start Time [id number]   PT Stop Time 1825   PT Time Calculation (min) 40 min   Activity Tolerance Patient tolerated treatment well   Behavior During Therapy [facility] for tasks assessed/performed      Past Medical History:  Diagnosis Date  . Anxiety   . Bronchitis   . Diabetes mellitus without complication ([place])   . Hyperlipidemia   . Sleep apnea     Past Surgical History:  Procedure Laterality Date  . CARPAL TUNNEL RELEASE  2001   as staated this was the left side, right side was completed in 2003  . DILATION AND CURETTAGE OF UTERUS  2008   as stated uterine polyps  . ROTATOR CUFF REPAIR Right 3004 and 2010  . TUBAL LIGATION  1999    There were no vitals filed for this visit.      Subjective Assessment - 12/01/16 1746    Subjective No pain right now. Has not taken ibuprofen today. Shoulders were fine after last week. 3-4/10 blateral shoulder pain since last session.  Has not had to take pain medication.    Pertinent History Bilateral shoulder pain R > L. Pt states having R rotator cuff surgery 2x (2nd surgery was around 2012, first surgery was around 2010) which helped for a while. Pt went for her regular physical in April 2017 and told her doctor that her L shoulder is bothering her. Her doctor told her that her L shoulder is impinged.   Pt states ther her R shoulder was injured again at work in 2012 when pt was cleaning with her R arm (which involved raising her R arm). 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 Had cortisone shots which helped for a month or 2. Pt states that her R shoulder does not feel 100% since the last surgery. States that she constantly taking cortisone shots (last shot was in November 2017).  Pt was sent to PT after her shots were not really working. This is the 3rd time she has PT for her R shoulder. Per pt, the therapy helped but nothing seems to last.  Unable to do her T-band HEP due to pain.  Pt states that her L shoulder pain might have been due to favoring her R shoulder. 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    Patient Stated Goals Have better motion, less pain.    Currently in Pain? No/denies   Pain Score 0-No pain   Pain Onset More than a month ago                                 PT Education - 12/01/16 1752    Education provided Yes   Education Details  ther-ex    Person(s) Educated Patient   Methods Explanation;Demonstration;Tactile cues;Verbal cues   Comprehension Verbalized understanding;Returned demonstration        Objectives   There-ex  Directed patient with ER isometrics in neutral with manual axial compression and A to P pressure to humeral head from PT 10x5 seconds each shoulder for 3 sets.   IR isometrics with manual axial compression to shoulder 10x5 seconds for 3 sets each UE  Shoulder extension isometrics with manual axial compression to shoulder 10x5 seconds for 2 sets each UE  Increased time with aforementioned exercises secondary to both shoulders being worked out individually.    Wall push-ups 5x3   Seated thoracic extension over chair 10x3 with 5 second holds to promote thoracic extension and scapular retraction  Standing wall push up position:  R and L weight shifting on  hands. 5x5 seconds each side for 3 sets. No pain. Better tolerance for exercise compared to last session  Standing table push-up position: lateral hand walks 5x2 to the R, 5x2 to the L    Improved exercise technique, movement at target joints, use of target muscles after mod verbal, visual, tactile cues.     Good carry over of decreased bilateral shoulder pain from last session. Continue working on gentle strengthening with gentle axial compression. Pt tolerated session well without aggravation of symptoms.          PT Long Term Goals - 11/17/16 1804      PT LONG TERM GOAL #1   Title Patient will have a decrease in R and L shoulder pain to 4/10 or less at most to promote ability to reach, don and doff shirts and coats, and perform work related tasks.    Baseline 9/10 R and L shoulder pain at most (11/17/2016)   Time 4   Period Weeks   Status New     PT LONG TERM GOAL #2   Title Patient will improve her Quick Dash Disability/Symptom score by at least 15% as a demonstration of improved function.    Baseline 47.72% (11/17/2016)   Time 4   Period Weeks   Status New     PT LONG TERM GOAL #3   Title Patient will improve her R shoulder flexion and abduction AROM by at least 10 degrees to promote ability to raise her R arm, and reach.    Baseline R shoulder flexion 125 degrees, abduction 68 degrees (11/17/2016)   Time 4   Period Weeks   Status New     PT LONG TERM GOAL #4   Title Patient will improve her L shoulder flexion and abduction AROM by at least 10 degrees to promote ability to raise her arm and reach.   Baseline L shoulder flexion 130 degrees, abduction 87 degrees (11/17/2016)   Time 4   Period Weeks   Status New               Plan - 12/01/16 1757    Clinical Impression Statement Good carry over of decreased bilateral shoulder pain from last session. Continue working on gentle strengthening with gentle axial compression. Pt tolerated session well without  aggravation of symptoms.    Rehab Potential Fair   Clinical Impairments Affecting Rehab Potential Chronicity of condition   PT Frequency 2x / week   PT Duration 4 weeks   PT Treatment/Interventions Therapeutic exercise;Therapeutic activities;Neuromuscular re-education;Patient/family education;Manual techniques;Dry needling;Electrical Stimulation;Iontophoresis 4mg /ml Dexamethasone;Ultrasound;Aquatic Therapy   PT Next Visit Plan stability exercises, ER strengthening, scapular strengthening, manual techniques,  modialities PRN   Consulted and Agree with Plan of Care Patient      Patient will benefit from skilled therapeutic intervention in order to improve the following deficits and impairments:  Pain, Improper body mechanics, Decreased strength, Decreased range of motion  Visit Diagnosis: Chronic right shoulder pain  Muscle weakness (generalized)  Chronic left shoulder pain     Problem List Patient Active Problem List   Diagnosis Date Noted  . Impingement syndrome of left shoulder 10/06/2016  . Impingement syndrome of right shoulder 10/06/2016  . BP (high blood pressure) 11/12/2015  . Abnormal abdominal MRI 11/09/2015  . Abnormal ECG 11/09/2015  . Anxiety 11/09/2015  . Type 2 diabetes mellitus with hyperglycemia ([place]) 11/09/2015  . Diabetes ([place]) 05/14/2015  . Adiposity 01/30/2014  . Avitaminosis D 05/03/2010  . Obstructive apnea 06/09/2007  . Hypercholesteremia 06/09/2007  . Compulsive tobacco user syndrome 06/09/2007  . Renal tubular disorder 05/19/2007  . Diabetes mellitus, type 2 ([place]) 06/04/2006  . HLD (hyperlipidemia) 06/04/2006  . Apnea, sleep 06/04/2006    Joneen Boers PT, DPT   12/01/2016, 6:28 PM  [facility] [facility] [address], [place], [address] Phone: [phone]   Fax:  [phone]  Name: Jacqueline Sparks MRN: [id number] Date of Birth: 06/27/1961

## 2016-12-03 ENCOUNTER — Ambulatory Visit: Payer: Federal, State, Local not specified - PPO

## 2016-12-03 DIAGNOSIS — M25511 Pain in right shoulder: Principal | ICD-10-CM

## 2016-12-03 DIAGNOSIS — G8929 Other chronic pain: Secondary | ICD-10-CM

## 2016-12-03 DIAGNOSIS — M25512 Pain in left shoulder: Secondary | ICD-10-CM

## 2016-12-03 DIAGNOSIS — M6281 Muscle weakness (generalized): Secondary | ICD-10-CM

## 2016-12-03 NOTE — Therapy (Signed)
[facility] [address], [place], [address] Phone: [phone]   Fax:  [phone]  Physical Therapy Treatment And Progress Report  Patient Details  Name: Jacqueline Sparks MRN: [id number] Date of Birth: [birth year] Referring Provider: Meridee Score, MD  Encounter Date: 12/03/2016      PT End of Session - 12/03/16 1807    Visit Number 5   Number of Visits 9   Date for PT Re-Evaluation 12/18/16   PT Start Time 1807  pt arrived late   PT Stop Time 1859   PT Time Calculation (min) 52 min   Activity Tolerance Patient tolerated treatment well   Behavior During Therapy [facility] for tasks assessed/performed      Past Medical History:  Diagnosis Date  . Anxiety   . Bronchitis   . Diabetes mellitus without complication ([place])   . Hyperlipidemia   . Sleep apnea     Past Surgical History:  Procedure Laterality Date  . CARPAL TUNNEL RELEASE  2001   as staated this was the left side, right side was completed in 2003  . DILATION AND CURETTAGE OF UTERUS  2008   as stated uterine polyps  . ROTATOR CUFF REPAIR Right 3004 and 2010  . TUBAL LIGATION  1999    There were no vitals filed for this visit.      Subjective Assessment - 12/03/16 1808    Subjective Had some pain in the L shoulder today. The R shoulder has not been too bad. 3/10 L shoulder and 1/10 R shoulder pain currently.  Had a 10 hour work day.  Also got stuck in traffic.  Has a follow up appointment with her MD on Monday 12/08/2016.   2-3/10 R shoulder pain at most (pt was off work for 3 days), 3-4/10 L shoulder at most for the past 7 days.    Pertinent History Bilateral shoulder pain R > L. Pt states having R rotator cuff surgery 2x (2nd surgery was around 2012, first surgery was around 2010) which helped for a while. Pt went for her regular physical in April 2017 and told her doctor that her L shoulder is bothering her. Her doctor told her that her L shoulder  is impinged.   Pt states ther her R shoulder was injured again at work in 2012 when pt was cleaning with her R arm (which involved raising her R arm). 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 Had cortisone shots which helped for a month or 2. Pt states that her R shoulder does not feel 100% since the last surgery. States that she constantly taking cortisone shots (last shot was in November 2017).  Pt was sent to PT after her shots were not really working. This is the 3rd time she has PT for her R shoulder. Per pt, the therapy helped but nothing seems to last.  Unable to do her T-band HEP due to pain.  Pt states that her L shoulder pain might have been due to favoring her R shoulder. 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    Patient Stated Goals Have better motion, less pain.    Currently in Pain? Yes   Pain Score 3    Pain Orientation  Left;Right   Pain Onset More than a month ago            [facility] PT Assessment - 12/03/16 1853      AROM   Overall AROM Comments ROM with discomfort   Right Shoulder Flexion 122 Degrees   Right Shoulder ABduction 122 Degrees   Left Shoulder Flexion 117 Degrees   Left Shoulder ABduction 95 Degrees                             PT Education - 12/03/16 1844    Education provided Yes   Education Details ther-ex, shoulder anatomy, positioning, and relation to shoulder pain   Person(s) Educated Patient   Methods Explanation;Demonstration;Tactile cues;Verbal cues   Comprehension Returned demonstration;Verbalized understanding        Objectives   There-ex  Directed patient with bilateral shoulder ER resisting yellow band with arms propped on table 2x. Discomfort. Eases with rest.   With axial  compression pressure and A to P pressure to head of humerus:  R shoulder ER resisting yellow band 10x3  L shoulder ER resisting yellow band 10x3    R shoulder IR resisting yellow band 10x3  L shoulder IR resisting yellow band 10x3  Pt education with shoulder anatomy, positioning, and relation to her shoulder pain. Visual model utilized.   Standing bilateral scapular retraction resisting yellow band 10x3 with 5 seconds   Wall push-ups 5x3  Standing shoulder flexion, abduction 1-2x each way for each UE.   Reviewed progress/current status with shoulder AROM with pt.    Improved exercise technique, movement at target joints, use of target muscles after mod verbal, visual, tactile cues.      Manual therapy  Seated with L arm at neutral: A to P to L shoulder grade 3. Felt good per pt.    Decreased L shoulder pain to 2/10 after manual therapy promote posterior glide to shoulder joint. Pt continues to tolerate exercises to promote shoulder ER and IR strengthening with axial compression and A to P pressure to shoulder joint. Decreased bilateral shoulder pain level at worst overall but pt also mentions that she had a few days off the past seven days. Improved bilateral shoulder abduction AROM since initial evaluation. Pt making progress towards goals. Pt still demonstrates bilateral shoulder pain, limited AROM, and difficulty performing functional tasks and would benefit from skilled physical therapy services to address the aforementioned deficits.            PT Long Term Goals - 12/03/16 1906      PT LONG TERM GOAL #1   Title Patient will have a decrease in R and L shoulder pain to 4/10 or less at most to promote ability to reach, don and doff shirts and coats, and perform work related tasks.    Baseline 9/10 R and L shoulder pain at most (11/17/2016); 2-3/10 R shoulder, and 3-4/10 L shoulder pain at most for the past 7 days (12/03/2016; pt also had a few days off work).    Time 4    Period Weeks   Status New     PT LONG TERM GOAL #2   Title Patient will improve her Quick Dash Disability/Symptom score by at least 15% as a demonstration of improved function.    Baseline 47.72% (11/17/2016)   Time 4   Period Weeks   Status Deferred     PT LONG TERM GOAL #3   Title Patient will  improve her R shoulder flexion and abduction AROM by at least 10 degrees to promote ability to raise her R arm, and reach.    Baseline R shoulder flexion 125 degrees, abduction 68 degrees (11/17/2016); R shoulder flexion 122 degrees, abduction 122 degrees (12/03/2016)   Time 4   Period Weeks   Status Partially Met     PT LONG TERM GOAL #4   Title Patient will improve her L shoulder flexion and abduction AROM by at least 10 degrees to promote ability to raise her arm and reach.   Baseline L shoulder flexion 130 degrees, abduction 87 degrees (11/17/2016); L shoulder flexion 117 degrees, abduction 95 degrees (12/03/2016)   Time 4   Period Weeks   Status On-going               Plan - 12/03/16 1844    Clinical Impression Statement Decreased L shoulder pain to 2/10 after manual therapy promote posterior glide to shoulder joint. Pt continues to tolerate exercises to promote shoulder ER and IR strengthening with axial compression and A to P pressure to shoulder joint. Decreased bilateral shoulder pain level at worst overall but pt also mentions that she had a few days off the past seven days. Improved bilateral shoulder abduction AROM since initial evaluation. Pt making progress towards goals. Pt still demonstrates bilateral shoulder pain, limited AROM, and difficulty performing functional tasks and would benefit from skilled physical therapy services to address the aforementioned deficits.    Rehab Potential Fair   Clinical Impairments Affecting Rehab Potential Chronicity of condition   PT Frequency 2x / week   PT Duration 4 weeks   PT Treatment/Interventions Therapeutic exercise;Therapeutic  activities;Neuromuscular re-education;Patient/family education;Manual techniques;Dry needling;Electrical Stimulation;Iontophoresis '4mg'$ /ml Dexamethasone;Ultrasound;Aquatic Therapy   PT Next Visit Plan stability exercises, ER strengthening, scapular strengthening, manual techniques, modialities PRN   Consulted and Agree with Plan of Care Patient      Patient will benefit from skilled therapeutic intervention in order to improve the following deficits and impairments:  Pain, Improper body mechanics, Decreased strength, Decreased range of motion  Visit Diagnosis: Chronic right shoulder pain  Muscle weakness (generalized)  Chronic left shoulder pain     Problem List Patient Active Problem List   Diagnosis Date Noted  . Impingement syndrome of left shoulder 10/06/2016  . Impingement syndrome of right shoulder 10/06/2016  . BP (high blood pressure) 11/12/2015  . Abnormal abdominal MRI 11/09/2015  . Abnormal ECG 11/09/2015  . Anxiety 11/09/2015  . Type 2 diabetes mellitus with hyperglycemia ([place]) 11/09/2015  . Diabetes ([place]) 05/14/2015  . Adiposity 01/30/2014  . Avitaminosis D 05/03/2010  . Obstructive apnea 06/09/2007  . Hypercholesteremia 06/09/2007  . Compulsive tobacco user syndrome 06/09/2007  . Renal tubular disorder 05/19/2007  . Diabetes mellitus, type 2 ([place]) 06/04/2006  . HLD (hyperlipidemia) 06/04/2006  . Apnea, sleep 06/04/2006   Thank you for your referral.   Joneen Boers PT, DPT   12/03/2016, 7:10 PM  [facility] [address], [place], [address] Phone: [phone]   Fax:  [phone]  Name: Jacqueline Sparks MRN: [id number] Date of Birth: Dec 02, 1960

## 2016-12-08 ENCOUNTER — Encounter (INDEPENDENT_AMBULATORY_CARE_PROVIDER_SITE_OTHER): Payer: Self-pay | Admitting: Orthopedic Surgery

## 2016-12-08 ENCOUNTER — Ambulatory Visit: Payer: Federal, State, Local not specified - PPO

## 2016-12-08 ENCOUNTER — Ambulatory Visit (INDEPENDENT_AMBULATORY_CARE_PROVIDER_SITE_OTHER): Admitting: Orthopedic Surgery

## 2016-12-08 DIAGNOSIS — M25511 Pain in right shoulder: Secondary | ICD-10-CM | POA: Diagnosis not present

## 2016-12-08 DIAGNOSIS — M7541 Impingement syndrome of right shoulder: Secondary | ICD-10-CM

## 2016-12-08 DIAGNOSIS — M7542 Impingement syndrome of left shoulder: Secondary | ICD-10-CM

## 2016-12-08 NOTE — Progress Notes (Signed)
Office Visit Note   Patient: Jacqueline Sparks           Date of Birth: 01-02-1961           MRN: [id number] Visit Date: 12/08/2016              Requested by: Mar Daring, PA-C [place], [place] [address] PCP: Mar Daring, PA-C   Assessment & Plan: Visit Diagnoses:  1. Impingement syndrome of left shoulder   2. Impingement syndrome of right shoulder     Plan: Continue with physical therapy continue with work restrictions. Patient did not bring her CA 17 paperwork she will bring her by so we can completed for her. Continue ibuprofen as needed for pain.  Follow-Up Instructions: Return in about 4 weeks (around 01/05/2017).   Orders:  No orders of the defined types were placed in this encounter.  No orders of the defined types were placed in this encounter.     Procedures: No procedures performed   Clinical Data: No additional findings.   Subjective: Chief Complaint  Patient presents with  . Right Shoulder - Follow-up  . Left Shoulder - Follow-up    Patient returns for follow up impingement syndrome bilateral shoulders. She has been going to physical therapy. She states that she is doing better. She still takes ibuprofen 800mg  as needed for pain, but states that she does not have to take it as often.   Patient states she is doing physical therapy 2 times a week and has physical therapy through March 1 she feels like this is helping. She states that her workmen's comp injury for the left shoulder was denied and  she has submitted an appeal.  Review of Systems   Objective: Vital Signs: There were no vitals taken for this visit.  Physical Exam on examination patient is alert oriented no adenopathy well-dressed normal affect normal respiratory effort she has a normal gait. Examination she has full range of motion of both shoulders. Both shoulders have external rotation of 90 and have pain with external rotation she also has internal  rotation of 70 and has pain with internal rotation. She has pain with drop arm test bilaterally. There is no crepitation in the subacromial space with range of motion. The biceps tendon is nontender to palpation the before meals joint is nontender to palpation.  Ortho Exam  Specialty Comments:  No specialty comments available.  Imaging: No results found.   PMFS History: Patient Active Problem List   Diagnosis Date Noted  . Impingement syndrome of left shoulder 10/06/2016  . Impingement syndrome of right shoulder 10/06/2016  . BP (high blood pressure) 11/12/2015  . Abnormal abdominal MRI 11/09/2015  . Abnormal ECG 11/09/2015  . Anxiety 11/09/2015  . Type 2 diabetes mellitus with hyperglycemia ([place]) 11/09/2015  . Diabetes ([place]) 05/14/2015  . Adiposity 01/30/2014  . Avitaminosis D 05/03/2010  . Obstructive apnea 06/09/2007  . Hypercholesteremia 06/09/2007  . Compulsive tobacco user syndrome 06/09/2007  . Renal tubular disorder 05/19/2007  . Diabetes mellitus, type 2 ([place]) 06/04/2006  . HLD (hyperlipidemia) 06/04/2006  . Apnea, sleep 06/04/2006   Past Medical History:  Diagnosis Date  . Anxiety   . Bronchitis   . Diabetes mellitus without complication ([place])   . Hyperlipidemia   . Sleep apnea     Family History  Problem Relation Age of Onset  . Hyperlipidemia Mother   . Hypertension Mother   . Diabetes Mother   . Diabetes  Father   . Hyperlipidemia Father   . Hypertension Father     Past Surgical History:  Procedure Laterality Date  . CARPAL TUNNEL RELEASE  2001   as staated this was the left side, right side was completed in 2003  . DILATION AND CURETTAGE OF UTERUS  2008   as stated uterine polyps  . ROTATOR CUFF REPAIR Right 3004 and 2010  . TUBAL LIGATION  1999   Social History   Occupational History  . Not on file.   Social History Main Topics  . Smoking status: Current Every Day Smoker    Packs/day: 1.00    Years: 30.00    Types: Cigarettes  .  Smokeless tobacco: Never Used  . Alcohol use No  . Drug use: No  . Sexual activity: Not on file

## 2016-12-11 ENCOUNTER — Ambulatory Visit: Payer: Federal, State, Local not specified - PPO

## 2016-12-11 DIAGNOSIS — G8929 Other chronic pain: Secondary | ICD-10-CM

## 2016-12-11 DIAGNOSIS — M25512 Pain in left shoulder: Secondary | ICD-10-CM | POA: Diagnosis not present

## 2016-12-11 DIAGNOSIS — M25511 Pain in right shoulder: Secondary | ICD-10-CM | POA: Diagnosis not present

## 2016-12-11 DIAGNOSIS — M6281 Muscle weakness (generalized): Secondary | ICD-10-CM

## 2016-12-11 NOTE — Therapy (Signed)
[facility] [address], [place], [address] Phone: [phone]   Fax:  [phone]  Physical Therapy Treatment  Patient Details  Name: Jacqueline Sparks MRN: [id number] Date of Birth: 05-20-1961 Referring Provider: Meridee Score, MD  Encounter Date: 12/11/2016      PT End of Session - 12/11/16 1756    Visit Number 6   Number of Visits 9   Date for PT Re-Evaluation 12/18/16   PT Start Time 1756  pt arrived late   PT Stop Time 1838   PT Time Calculation (min) 42 min   Activity Tolerance Patient tolerated treatment well   Behavior During Therapy [facility] for tasks assessed/performed      Past Medical History:  Diagnosis Date  . Anxiety   . Bronchitis   . Diabetes mellitus without complication ([place])   . Hyperlipidemia   . Sleep apnea     Past Surgical History:  Procedure Laterality Date  . CARPAL TUNNEL RELEASE  2001   as staated this was the left side, right side was completed in 2003  . DILATION AND CURETTAGE OF UTERUS  2008   as stated uterine polyps  . ROTATOR CUFF REPAIR Right 3004 and 2010  . TUBAL LIGATION  1999    There were no vitals filed for this visit.      Subjective Assessment - 12/11/16 1757    Subjective The shoulders feel pretty good today. The L has been giver her a little bit more trouble than the right. Feels like flipping the magazines at work play a factor (L shoulder IR in slight abduction, then uses L UE to push magazines to the R).  0/10 R shoulder pain, 2-3/10 L shoulder when reaching to the side and driving. Pain goes away quickly.    Pertinent History Bilateral shoulder pain R > L. Pt states having R rotator cuff surgery 2x (2nd surgery was around 2012, first surgery was around 2010) which helped for a while. Pt went for her regular physical in April 2017 and told her doctor that her L shoulder is bothering her. Her doctor told her that her L shoulder is impinged.   Pt states ther  her R shoulder was injured again at work in 2012 when pt was cleaning with her R arm (which involved raising her R arm). 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 Had cortisone shots which helped for a month or 2. Pt states that her R shoulder does not feel 100% since the last surgery. States that she constantly taking cortisone shots (last shot was in November 2017).  Pt was sent to PT after her shots were not really working. This is the 3rd time she has PT for her R shoulder. Per pt, the therapy helped but nothing seems to last.  Unable to do her T-band HEP due to pain.  Pt states that her L shoulder pain might have been due to favoring her R shoulder. 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    Patient Stated Goals Have better motion, less pain.    Currently in Pain? Yes   Pain Score 3    Pain Onset More than a month ago  PT Education - 12/11/16 1818    Education provided Yes   Education Details ther-ex   Person(s) Educated Patient   Methods Explanation;Demonstration;Tactile cues;Verbal cues   Comprehension Returned demonstration;Verbalized understanding        Objectives   There-ex  Directed patient with standing bilateral scapular retraction rows with arms to neutral 10x3 resisting yellow band to promote ability to flip and pull magazines at work  Slight anterior R shoulder discomfort which eases with rest.    With axial compression pressure and A to P pressure to head of humerus:             R shoulder ER resisting yellow band 10x3             L shoulder ER resisting yellow band 10x3                          R shoulder IR resisting yellow band 10x3             L shoulder IR resisting yellow  band 10x3  Sitting with L arm propped on table in slight abduction: IR resisting yellow band 10x3 to simulate flipping magazines   Bicep curls 1 lb 10x each UE  Then 2 lbs for 10x L UE, 5x R UE (R shoulder discomfort, eases with rest)   Triceps extension resisting yellow band 10x2 each UE.    Improved exercise technique, movement at target joints, use of target muscles after mod verbal, visual, tactile cues.    Good carryover of decreased shoulder pain from last session with 0/10 starting pain level for R shoulder today. L shoulder discomfort probably due to work related tasks. Worked on L shoulder IR and movements in similar positions to how it is at work to help address. Both shoulders worked on secondary to pt needing both sides to perform work duties. Pt tolerated session well without aggravation of symptoms.            PT Long Term Goals - 12/03/16 1906      PT LONG TERM GOAL #1   Title Patient will have a decrease in R and L shoulder pain to 4/10 or less at most to promote ability to reach, don and doff shirts and coats, and perform work related tasks.    Baseline 9/10 R and L shoulder pain at most (11/17/2016); 2-3/10 R shoulder, and 3-4/10 L shoulder pain at most for the past 7 days (12/03/2016; pt also had a few days off work).    Time 4   Period Weeks   Status New     PT LONG TERM GOAL #2   Title Patient will improve her Quick Dash Disability/Symptom score by at least 15% as a demonstration of improved function.    Baseline 47.72% (11/17/2016)   Time 4   Period Weeks   Status Deferred     PT LONG TERM GOAL #3   Title Patient will improve her R shoulder flexion and abduction AROM by at least 10 degrees to promote ability to raise her R arm, and reach.    Baseline R shoulder flexion 125 degrees, abduction 68 degrees (11/17/2016); R shoulder flexion 122 degrees, abduction 122 degrees (12/03/2016)   Time 4   Period Weeks   Status Partially Met     PT LONG TERM GOAL #4    Title Patient will improve her L shoulder flexion and abduction AROM by at least 10 degrees to promote ability to  raise her arm and reach.   Baseline L shoulder flexion 130 degrees, abduction 87 degrees (11/17/2016); L shoulder flexion 117 degrees, abduction 95 degrees (12/03/2016)   Time 4   Period Weeks   Status On-going               Plan - 12/11/16 1843    Clinical Impression Statement Good carryover of decreased shoulder pain from last session with 0/10 starting pain level for R shoulder today. L shoulder discomfort probably due to work related tasks. Worked on L shoulder IR and movements in similar positions to how it is at work to help address. Both shoulders worked on secondary to pt needing both sides to perform work duties. Pt tolerated session well without aggravation of symptoms.    Rehab Potential Fair   Clinical Impairments Affecting Rehab Potential Chronicity of condition   PT Frequency 2x / week   PT Duration 4 weeks   PT Treatment/Interventions Therapeutic exercise;Therapeutic activities;Neuromuscular re-education;Patient/family education;Manual techniques;Dry needling;Electrical Stimulation;Iontophoresis 87m/ml Dexamethasone;Ultrasound;Aquatic Therapy   PT Next Visit Plan stability exercises, ER strengthening, scapular strengthening, manual techniques, modialities PRN   Consulted and Agree with Plan of Care Patient      Patient will benefit from skilled therapeutic intervention in order to improve the following deficits and impairments:  Pain, Improper body mechanics, Decreased strength, Decreased range of motion  Visit Diagnosis: Chronic right shoulder pain  Muscle weakness (generalized)  Chronic left shoulder pain     Problem List Patient Active Problem List   Diagnosis Date Noted  . Impingement syndrome of left shoulder 10/06/2016  . Impingement syndrome of right shoulder 10/06/2016  . BP (high blood pressure) 11/12/2015  . Abnormal abdominal MRI  11/09/2015  . Abnormal ECG 11/09/2015  . Anxiety 11/09/2015  . Type 2 diabetes mellitus with hyperglycemia ([place] 11/09/2015  . Diabetes ([facility] 05/14/2015  . Adiposity 01/30/2014  . Avitaminosis D 05/03/2010  . Obstructive apnea 06/09/2007  . Hypercholesteremia 06/09/2007  . Compulsive tobacco user syndrome 06/09/2007  . Renal tubular disorder 05/19/2007  . Diabetes mellitus, type 2 ([place] 06/04/2006  . HLD (hyperlipidemia) 06/04/2006  . Apnea, sleep 06/04/2006    MJoneen BoersPT, DPT  12/11/2016, 6:45 PM  [facility] [address] [place] [address]Phone: [phone]  Fax:  [phone] Name: SAundraya DrippsMRN: [id number] of Birth: [birth year]

## 2016-12-15 ENCOUNTER — Ambulatory Visit: Payer: Federal, State, Local not specified - PPO

## 2016-12-15 DIAGNOSIS — G8929 Other chronic pain: Secondary | ICD-10-CM | POA: Diagnosis not present

## 2016-12-15 DIAGNOSIS — M25512 Pain in left shoulder: Secondary | ICD-10-CM | POA: Diagnosis not present

## 2016-12-15 DIAGNOSIS — M6281 Muscle weakness (generalized): Secondary | ICD-10-CM | POA: Diagnosis not present

## 2016-12-15 DIAGNOSIS — M25511 Pain in right shoulder: Principal | ICD-10-CM

## 2016-12-15 NOTE — Patient Instructions (Addendum)
Strengthening: Resisted Internal Rotation    Prop your elbow on top of table or surface (not shown)  Hold yellow band in left hand, elbow at side and forearm out. Rotate forearm in across body. Repeat __10__ times per set. Do __3__ sets per session. Do __1__ sessions per day.  Repeat for your right side.    http://orth.exer.us/830   Copyright  VHI. All rights reserved      External Rotation (Isometric)    Place back of left fist against door frame, with elbow bent. Press fist against door frame. Pain free level of effort.   Hold __5__ seconds. Repeat _10___ times. Do ___3_ sessions per day.   Repeat for your right side.    http://gt2.exer.us/109   Copyright  VHI. All rights reserved.

## 2016-12-15 NOTE — Therapy (Signed)
[place] [facility] [address], [place], [address] Phone: [phone]   Fax:  [phone]  Physical Therapy Treatment  Patient Details  Name: Jacqueline Sparks MRN: [id number] Date of Birth: Mar 19, 1961 Referring Provider: Meridee Score, MD  Encounter Date: 12/15/2016      PT End of Session - 12/15/16 1757    Visit Number 7   Number of Visits 9   Date for PT Re-Evaluation 12/18/16   PT Start Time 5638   PT Stop Time 1841   PT Time Calculation (min) 44 min   Activity Tolerance Patient tolerated treatment well   Behavior During Therapy [facility] for tasks assessed/performed      Past Medical History:  Diagnosis Date  . Anxiety   . Bronchitis   . Diabetes mellitus without complication ([place])   . Hyperlipidemia   . Sleep apnea     Past Surgical History:  Procedure Laterality Date  . CARPAL TUNNEL RELEASE  2001   as staated this was the left side, right side was completed in 2003  . DILATION AND CURETTAGE OF UTERUS  2008   as stated uterine polyps  . ROTATOR CUFF REPAIR Right 3004 and 2010  . TUBAL LIGATION  1999    There were no vitals filed for this visit.      Subjective Assessment - 12/15/16 1800    Subjective Washed the vynil siding at the group home this weekend. Tired feeling bilateral shoulders afterwards. Did what she could and took breaks. No pain bilateral shoulders. Was off since last Thursday (4 days off).  0/10 currently bilateral shoulders. 3/10 both shoulders at most for the past 3 days. Took Tylenol pm after she did the washing. Feels like she is heading towards her goals for her shoulders, at least for the R shoulder. Wants to wait and see how it goes after next visit before scheduling more PT sessions.    Pertinent History Bilateral shoulder pain R > L. Pt states having R rotator cuff surgery 2x (2nd surgery was around 2012, first surgery was around 2010) which helped for a while. Pt went for her  regular physical in April 2017 and told her doctor that her L shoulder is bothering her. Her doctor told her that her L shoulder is impinged.   Pt states ther her R shoulder was injured again at work in 2012 when pt was cleaning with her R arm (which involved raising her R arm). 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 Had cortisone shots which helped for a month or 2. Pt states that her R shoulder does not feel 100% since the last surgery. States that she constantly taking cortisone shots (last shot was in November 2017).  Pt was sent to PT after her shots were not really working. This is the 3rd time she has PT for her R shoulder. Per pt, the therapy helped but nothing seems to last.  Unable to do her T-band HEP due to pain.  Pt states that her L shoulder pain might have been due to favoring her R shoulder. 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    Patient Stated Goals Have better motion, less pain.    Currently in Pain? No/denies  Pain Score 0-No pain   Pain Onset More than a month ago            [facility] PT Assessment - 12/15/16 1805      Observation/Other Assessments   Quick DASH  36.36%  11.36% improvement since initial evaluation     AROM   Right Shoulder Flexion 145 Degrees   Right Shoulder ABduction 126 Degrees   Left Shoulder Flexion 143 Degrees   Left Shoulder ABduction 139 Degrees                             PT Education - 12/15/16 1834    Education provided Yes   Education Details ther-ex, HEP, progress/current status   Person(s) Educated Patient   Methods Explanation;Demonstration;Tactile cues;Verbal cues;Handout   Comprehension Returned demonstration;Verbalized understanding         Objectives   There-ex  Directed patient with standing shoulder flexion and abduction AROM 1x each for each UE. Reviewed progress/cureent status with L shoulder AROM with pt  Reviewed progress/current status with pain goal  With arm propped on table and pillow to promote axial pressure to shoulder:  R shoulder IR resisting yellow band 10x3  L shoulder IR resisting yellow band 10x3   R shoulder ER isometrics pain free 10x3 with 5 seconds  L shoulder ER isometrics pain free 10x3 with 5 seconds.   Standing bilateral scapular retraction resisting yellow band 10x3 with 5 second holds  Seated thoracic extension over chair 10x5 seconds for 2 sets to promote thoracic mobility       Improved exercise technique, movement at target joints, use of target muscles after min to mod verbal, visual, tactile cues.     Pt demonstrates improved bilateral shoulder flexion and abduction AROM, improved function, and decreased pain levels since initial evaluation. Pt had at least 4 days off work from the post office which may play a factor. Overall, patient making very good progress with PT towards goals. Better tolerance with exercises without needing PT manual assist for axial compression to both shoulders today.          PT Long Term Goals - 12/15/16 1809      PT LONG TERM GOAL #1   Title Patient will have a decrease in R and L shoulder pain to 4/10 or less at most to promote ability to reach, don and doff shirts and coats, and perform work related tasks.    Baseline 9/10 R and L shoulder pain at most (11/17/2016); 2-3/10 R shoulder, and 3-4/10 L shoulder pain at most for the past 7 days (12/03/2016; pt also had a few days off work).    3/10 bilateral shoulder at most for the past 7 days (12/15/2016)   Time 4   Period Weeks   Status Achieved     PT LONG TERM GOAL #2   Title Patient will improve her Quick Dash Disability/Symptom score by at least 15% as a demonstration of improved  function.    Baseline 47.72% (11/17/2016). 36.36% (12/15/2016)   Time 4   Period Weeks   Status Partially Met     PT LONG TERM GOAL #3   Title Patient will improve her R shoulder flexion and abduction AROM by at least 10 degrees to promote ability to raise her R arm, and reach.    Baseline R shoulder flexion 125 degrees, abduction 68 degrees (11/17/2016); R shoulder flexion 122 degrees, abduction 122 degrees (12/03/2016); R shoulder flexion  145 degrees, abduction 126 degrees AROM (12/15/2016)   Time 4   Period Weeks   Status Achieved     PT LONG TERM GOAL #4   Title Patient will improve her L shoulder flexion and abduction AROM by at least 10 degrees to promote ability to raise her arm and reach.   Baseline L shoulder flexion 130 degrees, abduction 87 degrees (11/17/2016); L shoulder flexion 117 degrees, abduction 95 degrees (12/03/2016); L shoulder flexion AROM 143 degrees, abduction 139 degrees   Time 4   Period Weeks   Status Achieved               Plan - 12/15/16 1759    Clinical Impression Statement Pt demonstrates improved bilateral shoulder flexion and abduction AROM, improved function, and decreased pain levels since initial evaluation. Pt had at least 4 days off work from the post office which may play a factor. Overall, patient making very good progress with PT towards goals. Better tolerance with exercises without needing PT manual assist for axial compression to both shoulders today.    Rehab Potential Fair   Clinical Impairments Affecting Rehab Potential Chronicity of condition   PT Frequency 2x / week   PT Duration 4 weeks   PT Treatment/Interventions Therapeutic exercise;Therapeutic activities;Neuromuscular re-education;Patient/family education;Manual techniques;Dry needling;Electrical Stimulation;Iontophoresis '4mg'$ /ml Dexamethasone;Ultrasound;Aquatic Therapy   PT Next Visit Plan stability exercises, ER strengthening, scapular strengthening, manual techniques, modialities  PRN   Consulted and Agree with Plan of Care Patient      Patient will benefit from skilled therapeutic intervention in order to improve the following deficits and impairments:  Pain, Improper body mechanics, Decreased strength, Decreased range of motion  Visit Diagnosis: Chronic right shoulder pain  Muscle weakness (generalized)  Chronic left shoulder pain     Problem List Patient Active Problem List   Diagnosis Date Noted  . Impingement syndrome of left shoulder 10/06/2016  . Impingement syndrome of right shoulder 10/06/2016  . BP (high blood pressure) 11/12/2015  . Abnormal abdominal MRI 11/09/2015  . Abnormal ECG 11/09/2015  . Anxiety 11/09/2015  . Type 2 diabetes mellitus with hyperglycemia () 11/09/2015  . Diabetes ([place]) 05/14/2015  . Adiposity 01/30/2014  . Avitaminosis D 05/03/2010  . Obstructive apnea 06/09/2007  . Hypercholesteremia 06/09/2007  . Compulsive tobacco user syndrome 06/09/2007  . Renal tubular disorder 05/19/2007  . Diabetes mellitus, type 2 ([place]) 06/04/2006  . HLD (hyperlipidemia) 06/04/2006  . Apnea, sleep 06/04/2006    Joneen Boers PT, DPT   12/15/2016, 6:54 PM  [facility] [address], [place], [address] Phone: [phone]   Fax:  [phone]  Name: Jacqueline Sparks MRN: [id number] Date of Birth: [birth year]

## 2016-12-17 ENCOUNTER — Ambulatory Visit: Payer: Federal, State, Local not specified - PPO

## 2016-12-17 DIAGNOSIS — M25512 Pain in left shoulder: Secondary | ICD-10-CM

## 2016-12-17 DIAGNOSIS — M6281 Muscle weakness (generalized): Secondary | ICD-10-CM | POA: Diagnosis not present

## 2016-12-17 DIAGNOSIS — M25511 Pain in right shoulder: Secondary | ICD-10-CM | POA: Diagnosis not present

## 2016-12-17 DIAGNOSIS — G8929 Other chronic pain: Secondary | ICD-10-CM

## 2016-12-17 NOTE — Therapy (Signed)
[facility] [address], [place], [address] Phone: [phone]   Fax:  [phone]  Physical Therapy Treatment  Patient Details  Name: Jacqueline Sparks MRN: [id number] Date of Birth: [birth year] Referring Provider: Meridee Score, MD  Encounter Date: 12/17/2016      PT End of Session - 12/17/16 1745    Visit Number 8   Number of Visits 9   Date for PT Re-Evaluation 12/18/16   PT Start Time 4970   PT Stop Time 1827   PT Time Calculation (min) 42 min   Activity Tolerance Patient tolerated treatment well   Behavior During Therapy [facility] for tasks assessed/performed      Past Medical History:  Diagnosis Date  . Anxiety   . Bronchitis   . Diabetes mellitus without complication ([place])   . Hyperlipidemia   . Sleep apnea     Past Surgical History:  Procedure Laterality Date  . CARPAL TUNNEL RELEASE  2001   as staated this was the left side, right side was completed in 2003  . DILATION AND CURETTAGE OF UTERUS  2008   as stated uterine polyps  . ROTATOR CUFF REPAIR Right 3004 and 2010  . TUBAL LIGATION  1999    There were no vitals filed for this visit.      Subjective Assessment - 12/17/16 1746    Subjective Shoulders are doing pretty good today. This is her first day back to working at the [employer] since 6 days ago. No pain both shoulders currently.  3/10 pain bilateral shoulders at most for the past 7 days.  Did not take her ibuprofen today.  Wants to try her HEP prior to scheduling more PT session.  Will discuss her shoulders at her next MD appointment in about 2 weeks.    Pertinent History Bilateral shoulder pain R > L. Pt states having R rotator cuff surgery 2x (2nd surgery was around 2012, first surgery was around 2010) which helped for a while. Pt went for her regular physical in April 2017 and told her doctor that her L shoulder is bothering her. Her doctor told her that her L shoulder is impinged.   Pt  states ther her R shoulder was injured again at work in 2012 when pt was cleaning with her R arm (which involved raising her R arm). 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 Had cortisone shots which helped for a month or 2. Pt states that her R shoulder does not feel 100% since the last surgery. States that she constantly taking cortisone shots (last shot was in November 2017).  Pt was sent to PT after her shots were not really working. This is the 3rd time she has PT for her R shoulder. Per pt, the therapy helped but nothing seems to last.  Unable to do her T-band HEP due to pain.  Pt states that her L shoulder pain might have been due to favoring her R shoulder. 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    Patient Stated Goals Have better motion, less pain.    Currently in Pain? No/denies   Pain Score 0-No pain   Pain Onset More than a month ago  PT Education - 12/17/16 1757    Education provided Yes   Education Details ther-ex, HEP   Person(s) Educated Patient   Methods Explanation;Demonstration;Tactile cues;Verbal cues;Handout   Comprehension Returned demonstration;Verbalized understanding        Objectives   There-ex  With arm propped on table and pillow to promote axial pressure to shoulder:             R shoulder IR resisting red band 10x2             L shoulder IR resisting red band 10x2   Gave red band for her HEP for when the yellow band becomes too easy for aforementioned exercise   Seated thoracic extension over chair 10x5 seconds for 3 sets to promote thoracic mobility   Reviewed and given as part of her HEP. Pt demonstrated and verbalized understanding   Standing bilateral  scapular retraction resisting yellow band 10x3 with 5 second holds   R shoulder ER isometrics pain free range 10x3 with 5 seconds using opposite arm for manual resistance  L shoulder ER isometrics pain free range 10x3 with 5 seconds using opposite arm for manual resistance.   Improved exercise technique, movement at target joints, use of target muscles after mod verbal, visual, tactile cues.     Pt demonstrates overall improved bilateral shoulder AROM, decreased pain in both shoulders and improved function since initial evaluation. Pt progressing very well towards goals. Pt to continue with HEP for the next 2 weeks to continue progress. Will wait to hear from pt about her and her doctor's decision whether or not to continue PT. Pt tolerated session well without aggravation of symptoms.                  PT Long Term Goals - 12/15/16 1809      PT LONG TERM GOAL #1   Title Patient will have a decrease in R and L shoulder pain to 4/10 or less at most to promote ability to reach, don and doff shirts and coats, and perform work related tasks.    Baseline 9/10 R and L shoulder pain at most (11/17/2016); 2-3/10 R shoulder, and 3-4/10 L shoulder pain at most for the past 7 days (12/03/2016; pt also had a few days off work).    3/10 bilateral shoulder at most for the past 7 days (12/15/2016)   Time 4   Period Weeks   Status Achieved     PT LONG TERM GOAL #2   Title Patient will improve her Quick Dash Disability/Symptom score by at least 15% as a demonstration of improved function.    Baseline 47.72% (11/17/2016). 36.36% (12/15/2016)   Time 4   Period Weeks   Status Partially Met     PT LONG TERM GOAL #3   Title Patient will improve her R shoulder flexion and abduction AROM by at least 10 degrees to promote ability to raise her R arm, and reach.    Baseline R shoulder flexion 125 degrees, abduction 68 degrees (11/17/2016); R shoulder flexion 122 degrees, abduction 122 degrees  (12/03/2016); R shoulder flexion 145 degrees, abduction 126 degrees AROM (12/15/2016)   Time 4   Period Weeks   Status Achieved     PT LONG TERM GOAL #4   Title Patient will improve her L shoulder flexion and abduction AROM by at least 10 degrees to promote ability to raise her arm and reach.   Baseline L shoulder flexion 130 degrees, abduction 87 degrees (11/17/2016); L  shoulder flexion 117 degrees, abduction 95 degrees (12/03/2016); L shoulder flexion AROM 143 degrees, abduction 139 degrees   Time 4   Period Weeks   Status Achieved               Plan - 12/17/16 1757    Clinical Impression Statement Pt demonstrates overall improved bilateral shoulder AROM, decreased pain in both shoulders and improved function since initial evaluation. Pt progressing very well towards goals. Pt to continue with HEP for the next 2 weeks to continue progress. Will wait to hear from pt about her and her doctor's decision whether or not to continue PT. Pt tolerated session well without aggravation of symptoms.    Rehab Potential Fair   Clinical Impairments Affecting Rehab Potential Chronicity of condition   PT Frequency 2x / week   PT Duration 4 weeks   PT Treatment/Interventions Therapeutic exercise;Therapeutic activities;Neuromuscular re-education;Patient/family education;Manual techniques;Dry needling;Electrical Stimulation;Iontophoresis 42m/ml Dexamethasone;Ultrasound;Aquatic Therapy   PT Next Visit Plan stability exercises, ER strengthening, scapular strengthening, manual techniques, modialities PRN   Consulted and Agree with Plan of Care Patient      Patient will benefit from skilled therapeutic intervention in order to improve the following deficits and impairments:  Pain, Improper body mechanics, Decreased strength, Decreased range of motion  Visit Diagnosis: Chronic right shoulder pain  Muscle weakness (generalized)  Chronic left shoulder pain     Problem List Patient Active Problem  List   Diagnosis Date Noted  . Impingement syndrome of left shoulder 10/06/2016  . Impingement syndrome of right shoulder 10/06/2016  . BP (high blood pressure) 11/12/2015  . Abnormal abdominal MRI 11/09/2015  . Abnormal ECG 11/09/2015  . Anxiety 11/09/2015  . Type 2 diabetes mellitus with hyperglycemia ([place] 11/09/2015  . Diabetes ([place] 05/14/2015  . Adiposity 01/30/2014  . Avitaminosis D 05/03/2010  . Obstructive apnea 06/09/2007  . Hypercholesteremia 06/09/2007  . Compulsive tobacco user syndrome 06/09/2007  . Renal tubular disorder 05/19/2007  . Diabetes mellitus, type 2 ([place] 06/04/2006  . HLD (hyperlipidemia) 06/04/2006  . Apnea, sleep 06/04/2006    MJoneen BoersPT, DPT   12/17/2016, 6:36 PM  [facility] [address] [place] [address]Phone: [phone]  Fax:  [phone] Name: SZarai OrsbornMRN: [id number] of Birth: [birth year]

## 2016-12-17 NOTE — Patient Instructions (Addendum)
(  Home) Extension: Thoracic With Lumbar Lock - Sitting    Sit with back against chair, knees bent, hands folded across (not shown). Extend trunk over chair back. Hold position for __5__ seconds. Repeat ___10  times per set. Do _3___ sets per session daily.   Copyright  VHI. All rights reserved.      Scapular Retraction: Rowing (Eccentric) - Arms - 45 Degrees (Resistance Band)   Hold end of band in each hand. Pull back until elbows are even with trunk. Pain free range of motion  Keep your shoulder blades down.   Hold for 5 seconds.  Use _____yellow___ resistance band. _10__ reps per set, _3__ sets per day. Copyright  VHI. All rights reserved.      External Rotation (Isometric)    With elbow bent and held at side, use other hand to apply pain-free level of resistance to outward motion of arm. Hold __5__ seconds. Repeat __10__ times. Do __3__ sessions per day.  Copyright  VHI. All rights reserved.

## 2017-01-05 ENCOUNTER — Encounter (INDEPENDENT_AMBULATORY_CARE_PROVIDER_SITE_OTHER): Payer: Self-pay | Admitting: Orthopedic Surgery

## 2017-01-05 ENCOUNTER — Ambulatory Visit (INDEPENDENT_AMBULATORY_CARE_PROVIDER_SITE_OTHER): Admitting: Orthopedic Surgery

## 2017-01-05 VITALS — Ht 63.0 in | Wt 165.0 lb

## 2017-01-05 DIAGNOSIS — M25511 Pain in right shoulder: Secondary | ICD-10-CM

## 2017-01-05 DIAGNOSIS — M7541 Impingement syndrome of right shoulder: Secondary | ICD-10-CM

## 2017-01-05 DIAGNOSIS — M7542 Impingement syndrome of left shoulder: Secondary | ICD-10-CM

## 2017-01-05 NOTE — Progress Notes (Signed)
Office Visit Note   Patient: Jacqueline Sparks           Date of Birth: 06/06/1961           MRN: [id number] Visit Date: 01/05/2017              Requested by: Mar Daring, PA-C [address] [place], [place] [address] PCP: Mar Daring, PA-C  Chief Complaint  Patient presents with  . Right Shoulder - Follow-up  . Left Shoulder - Follow-up    HPI: Patient states that she is here for bilateral shoulder impingement follow up. 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 Autumn L Forrest, RMA   Patient's history of present illness of the left shoulder patient states that on her annual physical exam in April 2020 17th she states that her shoulder was bothering her with no acute injury but she felt this was due to the repetitive motion of her work. Patient states that on 09/08/2016 she was pulling a package from [place] when she had acute left shoulder pain.  Assessment & Plan: Visit Diagnoses:  1. Impingement syndrome of left shoulder   2. 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 Her paperwork was completed that she could perform her regular activities of work except for lifting restriction of 10 pounds no pulling or pushing no overhead work and no climbing.  Follow-Up Instructions: Return if symptoms worsen or fail to improve.   Ortho Exam On examination patient is alert oriented no adenopathy well-dressed normal affect normal respiratory effort she has a normal gait. Examination she has full range of motion of both shoulders she has no crepitation range of motion of both shoulders the before meals joints are nontender to palpation she has pain with Neer and Hawkins impingement test bilaterally. ROS: Complete review of systems negative second as mentioned in history of present  illness. Imaging: No results found.  Labs: Lab Results  Component Value Date   HGBA1C 7.4 (H) 11/24/2016   HGBA1C 10.1 08/11/2016   HGBA1C 9.3 (H) 05/26/2016    Orders:  No orders of the defined types were placed in this encounter.  No orders of the defined types were placed in this encounter.    Procedures: No procedures performed  Clinical Data: No additional findings.  Subjective: Review of Systems  Objective: Vital Signs: Ht 5\' 3"  (1.6 m)   Wt 165 lb (74.8 kg)   BMI 29.23 kg/m   Specialty Comments:  No specialty comments available.  PMFS History: Patient Active Problem List   Diagnosis Date Noted  . Impingement syndrome of left shoulder 10/06/2016  . Impingement syndrome of right shoulder 10/06/2016  . BP (high blood pressure) 11/12/2015  . Abnormal abdominal MRI 11/09/2015  . Abnormal ECG 11/09/2015  . Anxiety 11/09/2015  . Type 2 diabetes mellitus with hyperglycemia ([place]) 11/09/2015  . Diabetes ([place]) 05/14/2015  . Adiposity 01/30/2014  . Avitaminosis D 05/03/2010  . Obstructive apnea 06/09/2007  . Hypercholesteremia 06/09/2007  . Compulsive tobacco user syndrome 06/09/2007  . Renal tubular disorder 05/19/2007  . Diabetes mellitus, type 2 ([place]) 06/04/2006  . HLD (hyperlipidemia) 06/04/2006  . Apnea, sleep 06/04/2006   Past Medical History:  Diagnosis Date  . Anxiety   . Bronchitis   . Diabetes mellitus without complication ([place])   .  Hyperlipidemia   . Sleep apnea     Family History  Problem Relation Age of Onset  . Hyperlipidemia Mother   . Hypertension Mother   . Diabetes Mother   . Diabetes Father   . Hyperlipidemia Father   . Hypertension Father     Past Surgical History:  Procedure Laterality Date  . CARPAL TUNNEL RELEASE  2001   as staated this was the left side, right side was completed in 2003  . DILATION AND CURETTAGE OF UTERUS  2008   as stated uterine polyps  . ROTATOR CUFF REPAIR Right 3004 and 2010  . TUBAL LIGATION   1999   Social History   Occupational History  . Not on file.   Social History Main Topics  . Smoking status: Current Every Day Smoker    Packs/day: 1.00    Years: 30.00    Types: Cigarettes  . Smokeless tobacco: Never Used  . Alcohol use No  . Drug use: No  . Sexual activity: Not on file

## 2017-02-09 ENCOUNTER — Encounter: Payer: Self-pay | Admitting: Physician Assistant

## 2017-02-09 ENCOUNTER — Ambulatory Visit (INDEPENDENT_AMBULATORY_CARE_PROVIDER_SITE_OTHER): Payer: Federal, State, Local not specified - PPO | Admitting: Physician Assistant

## 2017-02-09 VITALS — BP 138/74 | HR 84 | Temp 98.8°F | Resp 16 | Ht 63.0 in | Wt 169.0 lb

## 2017-02-09 DIAGNOSIS — L409 Psoriasis, unspecified: Secondary | ICD-10-CM

## 2017-02-09 DIAGNOSIS — Z0000 Encounter for general adult medical examination without abnormal findings: Secondary | ICD-10-CM

## 2017-02-09 DIAGNOSIS — Z1211 Encounter for screening for malignant neoplasm of colon: Secondary | ICD-10-CM

## 2017-02-09 DIAGNOSIS — E7800 Pure hypercholesterolemia, unspecified: Secondary | ICD-10-CM

## 2017-02-09 DIAGNOSIS — Z1239 Encounter for other screening for malignant neoplasm of breast: Secondary | ICD-10-CM

## 2017-02-09 DIAGNOSIS — Z1231 Encounter for screening mammogram for malignant neoplasm of breast: Secondary | ICD-10-CM

## 2017-02-09 DIAGNOSIS — E1165 Type 2 diabetes mellitus with hyperglycemia: Secondary | ICD-10-CM

## 2017-02-09 DIAGNOSIS — Z124 Encounter for screening for malignant neoplasm of cervix: Secondary | ICD-10-CM

## 2017-02-09 LAB — POCT GLYCOSYLATED HEMOGLOBIN (HGB A1C)
ESTIMATED AVERAGE GLUCOSE: 180
Hemoglobin A1C: 7.9

## 2017-02-09 MED ORDER — ATORVASTATIN CALCIUM 20 MG PO TABS: 20.0000 mg | ORAL_TABLET | Freq: Every day | ORAL | 5 refills | Status: DC

## 2017-02-09 MED ORDER — DAPAGLIFLOZIN PROPANEDIOL 10 MG PO TABS: 10.0000 mg | ORAL_TABLET | Freq: Every day | ORAL | 5 refills | Status: DC

## 2017-02-09 MED ORDER — CLOBETASOL PROPIONATE 0.05 % EX OINT: TOPICAL_OINTMENT | Freq: Two times a day (BID) | CUTANEOUS | 5 refills | Status: DC

## 2017-02-09 MED ORDER — GLIPIZIDE 10 MG PO TABS: ORAL_TABLET | ORAL | 5 refills | Status: DC

## 2017-02-09 MED ORDER — SITAGLIPTIN PHOSPHATE 100 MG PO TABS: 100.0000 mg | ORAL_TABLET | Freq: Every day | ORAL | 5 refills | Status: DC

## 2017-02-09 MED ORDER — PIOGLITAZONE HCL 45 MG PO TABS: 45.0000 mg | ORAL_TABLET | Freq: Every day | ORAL | 5 refills | Status: DC

## 2017-02-09 NOTE — Patient Instructions (Signed)
Health Maintenance for Postmenopausal Women Menopause is a normal process in which your reproductive ability comes to an end. This process happens gradually over a span of months to years, usually between the ages of 33 and 38. Menopause is complete when you have missed 12 consecutive menstrual periods. It is important to talk with your health care provider about some of the most common conditions that affect postmenopausal women, such as heart disease, cancer, and bone loss (osteoporosis). Adopting a healthy lifestyle and getting preventive care can help to promote your health and wellness. Those actions can also lower your chances of developing some of these common conditions. What should I know about menopause? During menopause, you may experience a number of symptoms, such as:  Moderate-to-severe hot flashes.  Night sweats.  Decrease in sex drive.  Mood swings.  Headaches.  Tiredness.  Irritability.  Memory problems.  Insomnia. Choosing to treat or not to treat menopausal changes is an individual decision that you make with your health care provider. What should I know about hormone replacement therapy and supplements? Hormone therapy products are effective for treating symptoms that are associated with menopause, such as hot flashes and night sweats. Hormone replacement carries certain risks, especially as you become older. If you are thinking about using estrogen or estrogen with progestin treatments, discuss the benefits and risks with your health care provider. What should I know about heart disease and stroke? Heart disease, heart attack, and stroke become more likely as you age. This may be due, in part, to the hormonal changes that your body experiences during menopause. These can affect how your body processes dietary fats, triglycerides, and cholesterol. Heart attack and stroke are both medical emergencies. There are many things that you can do to help prevent heart disease  and stroke:  Have your blood pressure checked at least every 1-2 years. High blood pressure causes heart disease and increases the risk of stroke.  If you are 48-61 years old, ask your health care provider if you should take aspirin to prevent a heart attack or a stroke.  Do not use any tobacco products, including cigarettes, chewing tobacco, or electronic cigarettes. If you need help quitting, ask your health care provider.  It is important to eat a healthy diet and maintain a healthy weight.  Be sure to include plenty of vegetables, fruits, low-fat dairy products, and lean protein.  Avoid eating foods that are high in solid fats, added sugars, or salt (sodium).  Get regular exercise. This is one of the most important things that you can do for your health.  Try to exercise for at least 150 minutes each week. The type of exercise that you do should increase your heart rate and make you sweat. This is known as moderate-intensity exercise.  Try to do strengthening exercises at least twice each week. Do these in addition to the moderate-intensity exercise.  Know your numbers.Ask your health care provider to check your cholesterol and your blood glucose. Continue to have your blood tested as directed by your health care provider. What should I know about cancer screening? There are several types of cancer. Take the following steps to reduce your risk and to catch any cancer development as early as possible. Breast Cancer  Practice breast self-awareness.  This means understanding how your breasts normally appear and feel.  It also means doing regular breast self-exams. Let your health care provider know about any changes, no matter how small.  If you are 40 or older,  have a clinician do a breast exam (clinical breast exam or CBE) every year. Depending on your age, family history, and medical history, it may be recommended that you also have a yearly breast X-ray (mammogram).  If you  have a family history of breast cancer, talk with your health care provider about genetic screening.  If you are at high risk for breast cancer, talk with your health care provider about having an MRI and a mammogram every year.  Breast cancer (BRCA) gene test is recommended for women who have family members with BRCA-related cancers. Results of the assessment will determine the need for genetic counseling and BRCA1 and for BRCA2 testing. BRCA-related cancers include these types:  Breast. This occurs in males or females.  Ovarian.  Tubal. This may also be called fallopian tube cancer.  Cancer of the abdominal or pelvic lining (peritoneal cancer).  Prostate.  Pancreatic. Cervical, Uterine, and Ovarian Cancer  Your health care provider may recommend that you be screened regularly for cancer of the pelvic organs. These include your ovaries, uterus, and vagina. This screening involves a pelvic exam, which includes checking for microscopic changes to the surface of your cervix (Pap test).  For women ages 21-65, health care providers may recommend a pelvic exam and a Pap test every three years. For women ages 23-65, they may recommend the Pap test and pelvic exam, combined with testing for human papilloma virus (HPV), every five years. Some types of HPV increase your risk of cervical cancer. Testing for HPV may also be done on women of any age who have unclear Pap test results.  Other health care providers may not recommend any screening for nonpregnant women who are considered low risk for pelvic cancer and have no symptoms. Ask your health care provider if a screening pelvic exam is right for you.  If you have had past treatment for cervical cancer or a condition that could lead to cancer, you need Pap tests and screening for cancer for at least 20 years after your treatment. If Pap tests have been discontinued for you, your risk factors (such as having a new sexual partner) need to be reassessed  to determine if you should start having screenings again. Some women have medical problems that increase the chance of getting cervical cancer. In these cases, your health care provider may recommend that you have screening and Pap tests more often.  If you have a family history of uterine cancer or ovarian cancer, talk with your health care provider about genetic screening.  If you have vaginal bleeding after reaching menopause, tell your health care provider.  There are currently no reliable tests available to screen for ovarian cancer. Lung Cancer  Lung cancer screening is recommended for adults 99-83 years old who are at high risk for lung cancer because of a history of smoking. A yearly low-dose CT scan of the lungs is recommended if you:  Currently smoke.  Have a history of at least 30 pack-years of smoking and you currently smoke or have quit within the past 15 years. A pack-year is smoking an average of one pack of cigarettes per day for one year. Yearly screening should:  Continue until it has been 15 years since you quit.  Stop if you develop a health problem that would prevent you from having lung cancer treatment. Colorectal Cancer  This type of cancer can be detected and can often be prevented.  Routine colorectal cancer screening usually begins at age 72 and continues  through age 75.  If you have risk factors for colon cancer, your health care provider may recommend that you be screened at an earlier age.  If you have a family history of colorectal cancer, talk with your health care provider about genetic screening.  Your health care provider may also recommend using home test kits to check for hidden blood in your stool.  A small camera at the end of a tube can be used to examine your colon directly (sigmoidoscopy or colonoscopy). This is done to check for the earliest forms of colorectal cancer.  Direct examination of the colon should be repeated every 5-10 years until  age 75. However, if early forms of precancerous polyps or small growths are found or if you have a family history or genetic risk for colorectal cancer, you may need to be screened more often. Skin Cancer  Check your skin from head to toe regularly.  Monitor any moles. Be sure to tell your health care provider:  About any new moles or changes in moles, especially if there is a change in a mole's shape or color.  If you have a mole that is larger than the size of a pencil eraser.  If any of your family members has a history of skin cancer, especially at a young age, talk with your health care provider about genetic screening.  Always use sunscreen. Apply sunscreen liberally and repeatedly throughout the day.  Whenever you are outside, protect yourself by wearing long sleeves, pants, a wide-brimmed hat, and sunglasses. What should I know about osteoporosis? Osteoporosis is a condition in which bone destruction happens more quickly than new bone creation. After menopause, you may be at an increased risk for osteoporosis. To help prevent osteoporosis or the bone fractures that can happen because of osteoporosis, the following is recommended:  If you are 19-50 years old, get at least 1,000 mg of calcium and at least 600 mg of vitamin D per day.  If you are older than age 50 but younger than age 70, get at least 1,200 mg of calcium and at least 600 mg of vitamin D per day.  If you are older than age 70, get at least 1,200 mg of calcium and at least 800 mg of vitamin D per day. Smoking and excessive alcohol intake increase the risk of osteoporosis. Eat foods that are rich in calcium and vitamin D, and do weight-bearing exercises several times each week as directed by your health care provider. What should I know about how menopause affects my mental health? Depression may occur at any age, but it is more common as you become older. Common symptoms of depression include:  Low or sad  mood.  Changes in sleep patterns.  Changes in appetite or eating patterns.  Feeling an overall lack of motivation or enjoyment of activities that you previously enjoyed.  Frequent crying spells. Talk with your health care provider if you think that you are experiencing depression. What should I know about immunizations? It is important that you get and maintain your immunizations. These include:  Tetanus, diphtheria, and pertussis (Tdap) booster vaccine.  Influenza every year before the flu season begins.  Pneumonia vaccine.  Shingles vaccine. Your health care provider may also recommend other immunizations. This information is not intended to replace advice given to you by your health care provider. Make sure you discuss any questions you have with your health care provider. Document Released: 11/28/2005 Document Revised: 04/25/2016 Document Reviewed: 07/10/2015 Elsevier Interactive Patient   Education  2017 [employer]  

## 2017-02-09 NOTE — Progress Notes (Signed)
Patient: Jacqueline Sparks, Female    DOB: 1961-01-25, 56 y.o.   MRN: [id number] Visit Date: 02/09/2017  Today's Provider: Mar Daring, PA-C   Chief Complaint  Patient presents with  . Annual Exam   Subjective:    Annual physical exam Jacqueline Sparks is a 56 y.o. female who presents today for health maintenance and complete physical. She feels fairly well. She reports exercising occasionally. She reports she is sleeping fairly well.  Colonoscopy- Mammogram- 11/20/2014. Had additional views done. [place] and biopsy done on left breast which were both benign.  Pap-  10/26/2014. Normal Tdap- unknown but patient refused today   Review of Systems  Constitutional: Negative.   Eyes: Positive for pain, redness, itching and visual disturbance. Negative for photophobia and discharge.       Blurred vision comes and goes.  Respiratory: Negative.   Cardiovascular: Negative.   Gastrointestinal: Negative.   Endocrine: Negative.   Genitourinary: Negative.  Negative for difficulty urinating.  Musculoskeletal: Negative.   Skin: Negative.   Allergic/Immunologic: Negative.   Neurological: Negative.   Hematological: Negative.   Psychiatric/Behavioral: Negative.     Social History      She  reports that she has been smoking Cigarettes.  She has a 30.00 pack-year smoking history. She has never used smokeless tobacco. She reports that she does not drink alcohol or use drugs.       Social History   Social History  . Marital status: Single    Spouse name: N/A  . Number of children: N/A  . Years of education: N/A   Social History Main Topics  . Smoking status: Current Every Day Smoker    Packs/day: 1.00    Years: 30.00    Types: Cigarettes  . Smokeless tobacco: Never Used  . Alcohol use No  . Drug use: No  . Sexual activity: Not on file   Other Topics Concern  . Not on file   Social History Narrative  . No narrative on file    Past Medical History:  Diagnosis Date  . Anxiety    . Bronchitis   . Diabetes mellitus without complication ([place])   . Hyperlipidemia   . Sleep apnea      Patient Active Problem List   Diagnosis Date Noted  . Impingement syndrome of left shoulder 10/06/2016  . Impingement syndrome of right shoulder 10/06/2016  . BP (high blood pressure) 11/12/2015  . Abnormal abdominal MRI 11/09/2015  . Abnormal ECG 11/09/2015  . Anxiety 11/09/2015  . Type 2 diabetes mellitus with hyperglycemia ([place]) 11/09/2015  . Diabetes ([place]) 05/14/2015  . Adiposity 01/30/2014  . Avitaminosis D 05/03/2010  . Obstructive apnea 06/09/2007  . Hypercholesteremia 06/09/2007  . Compulsive tobacco user syndrome 06/09/2007  . Renal tubular disorder 05/19/2007  . Diabetes mellitus, type 2 ([place]) 06/04/2006  . HLD (hyperlipidemia) 06/04/2006  . Apnea, sleep 06/04/2006    Past Surgical History:  Procedure Laterality Date  . CARPAL TUNNEL RELEASE  2001   as staated this was the left side, right side was completed in 2003  . DILATION AND CURETTAGE OF UTERUS  2008   as stated uterine polyps  . ROTATOR CUFF REPAIR Right 3004 and 2010  . TUBAL LIGATION  1999    Family History        Family Status  Relation Status  . Mother Alive  . Father Alive  . Maternal Grandmother Deceased   died from a stroke  . Maternal Grandfather Deceased  diaabetes Mellitus Type II  . Paternal Grandmother Deceased   Colon Cancer  . Paternal Grandfather Deceased   Died from old age  . Sister Alive  . Sister Alive  . Sister Alive        Her family history includes Diabetes in her father and mother; Hyperlipidemia in her father and mother; Hypertension in her father and mother.     No Known Allergies   Current Outpatient Prescriptions:  .  ALPRAZolam (XANAX) 0.5 MG tablet, TAKE ONE-HALF TO ONE TABLET BY MOUTH TWICE DAILY AS NEEDED, Disp: 60 tablet, Rfl: 5 .  atorvastatin (LIPITOR) 20 MG tablet, Take 1 tablet (20 mg total) by mouth daily., Disp: 90 tablet, Rfl: 1 .  clobetasol  ointment (TEMOVATE) 0.05 %, , Disp: , Rfl:  .  cyclobenzaprine (FLEXERIL) 5 MG tablet, Take 1 tablet (5 mg total) by mouth 3 (three) times daily as needed for muscle spasms., Disp: 15 tablet, Rfl: 0 .  dapagliflozin propanediol (FARXIGA) 10 MG TABS tablet, Take 10 mg by mouth daily., Disp: 30 tablet, Rfl: 5 .  glipiZIDE (GLUCOTROL) 10 MG tablet, TAKE ONE TABLET BY MOUTH TWICE DAILY 30 MINUTES BEFORE A MEAL, Disp: 60 tablet, Rfl: 5 .  nabumetone (RELAFEN) 750 MG tablet, Take 1 tablet (750 mg total) by mouth 2 (two) times daily., Disp: 30 tablet, Rfl: 0 .  NON FORMULARY, CPAP, Disp: , Rfl:  .  ONE TOUCH ULTRA TEST test strip, USE ONE STRIP TO CHECK GLUCOSE ONCE DAILY, Disp: 100 each, Rfl: 1 .  pioglitazone (ACTOS) 45 MG tablet, Take 1 tablet (45 mg total) by mouth daily., Disp: 30 tablet, Rfl: 5 .  sitaGLIPtin (JANUVIA) 100 MG tablet, Take 1 tablet (100 mg total) by mouth daily., Disp: 30 tablet, Rfl: 5   Patient Care Team: Mar Daring, PA-C as PCP - General (Family Medicine)      Objective:   Vitals: BP 138/74 (BP Location: Right Arm, Patient Position: Sitting, Cuff Size: Normal)   Pulse 84   Temp 98.8 F (37.1 C)   Resp 16   Ht 5\' 3"  (1.6 m)   Wt 169 lb (76.7 kg)   BMI 29.94 kg/m    Vitals:   02/09/17 0818  BP: 138/74  Pulse: 84  Resp: 16  Temp: 98.8 F (37.1 C)  Weight: 169 lb (76.7 kg)  Height: 5\' 3"  (1.6 m)     Physical Exam  Constitutional: She is oriented to person, place, and time. She appears well-developed and well-nourished. No distress.  HENT:  Head: Normocephalic and atraumatic.  Right Ear: Hearing, tympanic membrane, external ear and ear canal normal.  Left Ear: Hearing, tympanic membrane, external ear and ear canal normal.  Nose: Nose normal.  Mouth/Throat: Uvula is midline, oropharynx is clear and moist and mucous membranes are normal. No oropharyngeal exudate.  Eyes: Conjunctivae and EOM are normal. Pupils are equal, round, and reactive to light.  Right eye exhibits no discharge. Left eye exhibits no discharge. No scleral icterus.  Neck: Normal range of motion. Neck supple. No JVD present. Carotid bruit is not present. No tracheal deviation present. No thyromegaly present.  Cardiovascular: Normal rate, regular rhythm, normal heart sounds and intact distal pulses.  Exam reveals no gallop and no friction rub.   No murmur heard. Pulmonary/Chest: Effort normal and breath sounds normal. No respiratory distress. She has no wheezes. She has no rales. She exhibits no tenderness. Right breast exhibits no inverted nipple, no mass, no nipple discharge, no skin change  and no tenderness. Left breast exhibits no inverted nipple, no mass, no nipple discharge, no skin change and no tenderness. Breasts are symmetrical.  Abdominal: Soft. Bowel sounds are normal. She exhibits no distension and no mass. There is no tenderness. There is no rebound and no guarding. Hernia confirmed negative in the right inguinal area and confirmed negative in the left inguinal area.  Genitourinary: Rectum normal, vagina normal and uterus normal. No breast swelling, tenderness, discharge or bleeding. Pelvic exam was performed with patient supine. There is no rash, tenderness, lesion or injury on the right labia. There is no rash, tenderness, lesion or injury on the left labia. Cervix exhibits no motion tenderness, no discharge and no friability. Right adnexum displays no mass, no tenderness and no fullness. Left adnexum displays no mass, no tenderness and no fullness. No erythema, tenderness or bleeding in the vagina. No signs of injury around the vagina. No vaginal discharge found.  Musculoskeletal: Normal range of motion. She exhibits no edema or tenderness.  Lymphadenopathy:    She has no cervical adenopathy.       Right: No inguinal adenopathy present.       Left: No inguinal adenopathy present.  Neurological: She is alert and oriented to person, place, and time. She has normal  reflexes. No cranial nerve deficit. Coordination normal.  Skin: Skin is warm and dry. No rash noted. She is not diaphoretic.  Psychiatric: She has a normal mood and affect. Her behavior is normal. Judgment and thought content normal.  Vitals reviewed.  Diabetic Foot Exam - Simple   Simple Foot Form Diabetic Foot exam was performed with the following findings:  Yes 02/09/2017 10:10 AM  Visual Inspection See comments:  Yes Sensation Testing Intact to touch and monofilament testing bilaterally:  Yes Pulse Check Posterior Tibialis and Dorsalis pulse intact bilaterally:  Yes Comments Psoriasis noted on sides of feet. There is a sore on the left great toe that was caused by a show that is healing. Advised patient to call if it does not heal completely.     Depression Screen PHQ 2/9 Scores 02/09/2017 02/09/2017  PHQ - 2 Score 0 0  PHQ- 9 Score 2 -      Assessment & Plan:     Routine Health Maintenance and Physical Exam  Exercise Activities and Dietary recommendations Goals    None       There is no immunization history on file for this patient.  Health Maintenance  Topic Date Due  . Hepatitis C Screening  04/11/1961  . PNEUMOCOCCAL POLYSACCHARIDE VACCINE (1) 03/01/1965  . FOOT EXAM  03/01/1973  . OPHTHALMOLOGY EXAM  03/01/1973  . HIV Screening  03/01/1978  . TETANUS/TDAP  03/01/1982  . MAMMOGRAM  03/01/2013  . COLONOSCOPY  03/01/2013  . URINE MICROALBUMIN  11/11/2016  . INFLUENZA VACCINE  05/20/2017  . HEMOGLOBIN A1C  05/24/2017  . PAP SMEAR  10/26/2017     Discussed health benefits of physical activity, and encouraged her to engage in regular exercise appropriate for her age and condition.   1. Annual physical exam Normal physical exam today. Labs done on 11/27/16 reviewed with patient today.  2. Colon cancer screening Reports colonoscopy done many, many years ago to acute issue. No screening colonoscopies performed. Referral placed as below.  - Ambulatory  referral to Gastroenterology  3. Breast cancer screening Breast exam today was normal. There is no family history of breast cancer. She does perform regular self breast exams. Mammogram was ordered as below.  Information for [facility] was given to patient so she may schedule her mammogram at her convenience. - MM Digital Screening; Future  4. Cervical cancer screening Pap collected today. Will send as below and f/u pending results. - Pap IG and HPV (high risk) DNA detection  5. Type 2 diabetes mellitus with hyperglycemia, unspecified whether long term insulin use (HCC) A1c up slightly today to 7.9 from 7.4. Patient has had a couple medications she has been completely out of. Will refill meds as below. I will see her back in 3 months for A1c.  - POCT HgB A1C - glipiZIDE (GLUCOTROL) 10 MG tablet; TAKE ONE TABLET BY MOUTH TWICE DAILY 30 MINUTES BEFORE A MEAL  Dispense: 60 tablet; Refill: 5 - dapagliflozin propanediol (FARXIGA) 10 MG TABS tablet; Take 10 mg by mouth daily.  Dispense: 30 tablet; Refill: 5 - sitaGLIPtin (JANUVIA) 100 MG tablet; Take 1 tablet (100 mg total) by mouth daily.  Dispense: 30 tablet; Refill: 5  6. Hypercholesteremia Stable. Diagnosis pulled for medication refill. Continue current medical treatment plan. - atorvastatin (LIPITOR) 20 MG tablet; Take 1 tablet (20 mg total) by mouth daily.  Dispense: 30 tablet; Refill: 5  7. Psoriasis Stable. Diagnosis pulled for medication refill. Continue current medical treatment plan. - clobetasol ointment (TEMOVATE) 0.05 %; Apply topically 2 (two) times daily.  Dispense: 60 g; Refill: [place], PA-C  [facility]

## 2017-02-10 ENCOUNTER — Other Ambulatory Visit: Payer: Self-pay

## 2017-02-10 ENCOUNTER — Telehealth: Payer: Self-pay

## 2017-02-10 DIAGNOSIS — Z86010 Personal history of colonic polyps: Secondary | ICD-10-CM

## 2017-02-10 NOTE — Telephone Encounter (Signed)
Gastroenterology Pre-Procedure Review  Request Date: 5/1 Requesting Physician: Dr. Allen Norris   PATIENT REVIEW QUESTIONS: The patient responded to the following health history questions as indicated:    1. Are you having any GI issues? no 2. Do you have a personal history of Polyps? no 3. Do you have a family history of Colon Cancer or Polyps? yes (polyps) 4. Diabetes Mellitus? yes (TypeII) 5. Joint replacements in the past 12 months?no 6. Major health problems in the past 3 months?no 7. Any artificial heart valves, MVP, or defibrillator?no    MEDICATIONS & ALLERGIES:    Patient reports the following regarding taking any anticoagulation/antiplatelet therapy:   Plavix, Coumadin, Eliquis, Xarelto, Lovenox, Pradaxa, Brilinta, or Effient? no Aspirin? no  Patient confirms/reports the following medications:  Current Outpatient Prescriptions  Medication Sig Dispense Refill  . ALPRAZolam (XANAX) 0.5 MG tablet TAKE ONE-HALF TO ONE TABLET BY MOUTH TWICE DAILY AS NEEDED 60 tablet 5  . atorvastatin (LIPITOR) 20 MG tablet Take 1 tablet (20 mg total) by mouth daily. 30 tablet 5  . clobetasol ointment (TEMOVATE) 0.05 % Apply topically 2 (two) times daily. 60 g 5  . cyclobenzaprine (FLEXERIL) 5 MG tablet Take 1 tablet (5 mg total) by mouth 3 (three) times daily as needed for muscle spasms. 15 tablet 0  . dapagliflozin propanediol (FARXIGA) 10 MG TABS tablet Take 10 mg by mouth daily. 30 tablet 5  . glipiZIDE (GLUCOTROL) 10 MG tablet TAKE ONE TABLET BY MOUTH TWICE DAILY 30 MINUTES BEFORE A MEAL 60 tablet 5  . nabumetone (RELAFEN) 750 MG tablet Take 1 tablet (750 mg total) by mouth 2 (two) times daily. 30 tablet 0  . NON FORMULARY CPAP    . ONE TOUCH ULTRA TEST test strip USE ONE STRIP TO CHECK GLUCOSE ONCE DAILY 100 each 1  . pioglitazone (ACTOS) 45 MG tablet Take 1 tablet (45 mg total) by mouth daily. 30 tablet 5  . sitaGLIPtin (JANUVIA) 100 MG tablet Take 1 tablet (100 mg total) by mouth daily. 30 tablet  5   No current facility-administered medications for this visit.     Patient confirms/reports the following allergies:  No Known Allergies  No orders of the defined types were placed in this encounter.   AUTHORIZATION INFORMATION Primary Insurance: 1D#: Group #:  Secondary Insurance: 1D#: Group #:  SCHEDULE INFORMATION: Date: 5/1 Time: Location: [place]

## 2017-02-11 LAB — PAP IG AND HPV HIGH-RISK
HPV, high-risk: POSITIVE — AB
PAP Smear Comment: 0

## 2017-02-12 ENCOUNTER — Telehealth: Payer: Self-pay

## 2017-02-12 DIAGNOSIS — R87810 Cervical high risk human papillomavirus (HPV) DNA test positive: Secondary | ICD-10-CM

## 2017-02-12 NOTE — Telephone Encounter (Signed)
-----   Message from Mar Daring, [place] sent at 02/11/2017  5:31 PM EDT ----- Pap was normal but positive for HPV. We could do 1 of 2 things: 1)refer to GYN for further evaluation with possible colposcopy or 2) retest in 1 year. Both options are discussed in literature and no definitive answer between which is better. If it were me I would rather have the colposcopy to make sure no changes are truly noted.

## 2017-02-12 NOTE — Telephone Encounter (Signed)
Pt advised of results. Pt prefers to be referred to [facility] for further evaluation. Referral ordered. Renaldo Fiddler, CMA

## 2017-02-17 ENCOUNTER — Encounter
Admission: RE | Disposition: A | Discharge status: Home / Self Care | Payer: Self-pay | Source: Ambulatory Visit | Attending: Gastroenterology

## 2017-02-17 ENCOUNTER — Ambulatory Visit: Payer: Federal, State, Local not specified - PPO | Admitting: Anesthesiology

## 2017-02-17 ENCOUNTER — Ambulatory Visit
Admission: RE | Admit: 2017-02-17 | Discharge: 2017-02-17 | Disposition: A | Discharge status: Home / Self Care | Payer: Federal, State, Local not specified - PPO | Source: Ambulatory Visit | Attending: Gastroenterology | Admitting: Gastroenterology

## 2017-02-17 DIAGNOSIS — N289 Disorder of kidney and ureter, unspecified: Secondary | ICD-10-CM | POA: Insufficient documentation

## 2017-02-17 DIAGNOSIS — F17210 Nicotine dependence, cigarettes, uncomplicated: Secondary | ICD-10-CM | POA: Diagnosis not present

## 2017-02-17 DIAGNOSIS — K640 First degree hemorrhoids: Secondary | ICD-10-CM | POA: Diagnosis not present

## 2017-02-17 DIAGNOSIS — D124 Benign neoplasm of descending colon: Secondary | ICD-10-CM | POA: Insufficient documentation

## 2017-02-17 DIAGNOSIS — K6389 Other specified diseases of intestine: Secondary | ICD-10-CM | POA: Insufficient documentation

## 2017-02-17 DIAGNOSIS — D125 Benign neoplasm of sigmoid colon: Secondary | ICD-10-CM | POA: Diagnosis not present

## 2017-02-17 DIAGNOSIS — E785 Hyperlipidemia, unspecified: Secondary | ICD-10-CM | POA: Insufficient documentation

## 2017-02-17 DIAGNOSIS — E119 Type 2 diabetes mellitus without complications: Secondary | ICD-10-CM | POA: Insufficient documentation

## 2017-02-17 DIAGNOSIS — K635 Polyp of colon: Secondary | ICD-10-CM | POA: Diagnosis not present

## 2017-02-17 DIAGNOSIS — I10 Essential (primary) hypertension: Secondary | ICD-10-CM | POA: Diagnosis not present

## 2017-02-17 DIAGNOSIS — Z1211 Encounter for screening for malignant neoplasm of colon: Secondary | ICD-10-CM

## 2017-02-17 DIAGNOSIS — Z79899 Other long term (current) drug therapy: Secondary | ICD-10-CM | POA: Diagnosis not present

## 2017-02-17 DIAGNOSIS — Z7984 Long term (current) use of oral hypoglycemic drugs: Secondary | ICD-10-CM | POA: Insufficient documentation

## 2017-02-17 DIAGNOSIS — F419 Anxiety disorder, unspecified: Secondary | ICD-10-CM | POA: Insufficient documentation

## 2017-02-17 DIAGNOSIS — Z86010 Personal history of colonic polyps: Secondary | ICD-10-CM

## 2017-02-17 DIAGNOSIS — K219 Gastro-esophageal reflux disease without esophagitis: Secondary | ICD-10-CM | POA: Insufficient documentation

## 2017-02-17 DIAGNOSIS — G4730 Sleep apnea, unspecified: Secondary | ICD-10-CM | POA: Insufficient documentation

## 2017-02-17 DIAGNOSIS — J449 Chronic obstructive pulmonary disease, unspecified: Secondary | ICD-10-CM | POA: Diagnosis not present

## 2017-02-17 HISTORY — DX: Gastro-esophageal reflux disease without esophagitis: K21.9

## 2017-02-17 HISTORY — PX: COLONOSCOPY WITH PROPOFOL: SHX5780

## 2017-02-17 LAB — POCT PREGNANCY, URINE: PREG TEST UR: NEGATIVE

## 2017-02-17 LAB — GLUCOSE, CAPILLARY: GLUCOSE-CAPILLARY: 193 mg/dL — AB (ref 65–99)

## 2017-02-17 SURGERY — COLONOSCOPY WITH PROPOFOL
Anesthesia: General

## 2017-02-17 MED ORDER — PROPOFOL 500 MG/50ML IV EMUL
INTRAVENOUS | Status: DC | PRN
  Administered 2017-02-17: 150 ug/kg/min via INTRAVENOUS

## 2017-02-17 MED ORDER — LIDOCAINE HCL (PF) 2 % IJ SOLN
INTRAMUSCULAR | Status: AC
  Filled 2017-02-17: qty 2

## 2017-02-17 MED ORDER — PROPOFOL 10 MG/ML IV BOLUS
INTRAVENOUS | Status: DC | PRN
  Administered 2017-02-17: 20 mg via INTRAVENOUS

## 2017-02-17 MED ORDER — SODIUM CHLORIDE 0.9 % IV SOLN
INTRAVENOUS | Status: DC
  Administered 2017-02-17: 10:00:00 via INTRAVENOUS

## 2017-02-17 MED ORDER — PROPOFOL 500 MG/50ML IV EMUL
INTRAVENOUS | Status: AC
Start: 2017-02-17 — End: 2017-02-17
  Filled 2017-02-17: qty 50

## 2017-02-17 MED ORDER — PROPOFOL 10 MG/ML IV BOLUS
INTRAVENOUS | Status: AC
  Filled 2017-02-17: qty 20

## 2017-02-17 MED ORDER — LIDOCAINE HCL (CARDIAC) 20 MG/ML IV SOLN
INTRAVENOUS | Status: DC | PRN
  Administered 2017-02-17: 50 mg via INTRAVENOUS

## 2017-02-17 NOTE — Transfer of Care (Signed)
Immediate Anesthesia Transfer of Care Note  Patient: Jacqueline Sparks  Procedure(s) Performed: Procedure(s): COLONOSCOPY WITH PROPOFOL (N/A)  Patient Location: PACU  Anesthesia Type:General  Level of Consciousness: drowsy and responds to stimulation  Airway & Oxygen Therapy: Patient Spontanous Breathing and Patient connected to nasal cannula oxygen  Post-op Assessment: Report given to RN and Post -op Vital signs reviewed and stable  Post vital signs: Reviewed and stable  Last Vitals:  Vitals:   02/17/17 1109  BP: 118/73  Pulse: 76  Resp: 17  Temp: 36.3 C    Last Pain:  Vitals:   02/17/17 1109  TempSrc: Tympanic  PainSc: 0-No pain         Complications: No apparent anesthesia complications

## 2017-02-17 NOTE — H&P (Signed)
Lucilla Lame, MD [facility] [address], [place], [place] [address] Phone: [phone] Fax : [phone]  Primary Care Physician:  Mar Daring, PA-C Primary Gastroenterologist:  Dr. Allen Norris  Pre-Procedure History & Physical: HPI:  Jacqueline Sparks is a 56 y.o. female is here for a screening colonoscopy.   Past Medical History:  Diagnosis Date  . Anxiety   . Bronchitis   . Diabetes mellitus without complication ([place])   . GERD (gastroesophageal reflux disease)   . Hyperlipidemia   . Sleep apnea     Past Surgical History:  Procedure Laterality Date  . CARPAL TUNNEL RELEASE  2001   as staated this was the left side, right side was completed in 2003  . DILATION AND CURETTAGE OF UTERUS  2008   as stated uterine polyps  . ROTATOR CUFF REPAIR Right 3004 and 2010  . TUBAL LIGATION  1999    Prior to Admission medications   Medication Sig Start Date End Date Taking? Authorizing Provider  ALPRAZolam Duanne Moron) 0.5 MG tablet TAKE ONE-HALF TO ONE TABLET BY MOUTH TWICE DAILY AS NEEDED 07/28/16   Mar Daring, PA-C  atorvastatin (LIPITOR) 20 MG tablet Take 1 tablet (20 mg total) by mouth daily. 02/09/17   Mar Daring, PA-C  clobetasol ointment (TEMOVATE) 0.05 % Apply topically 2 (two) times daily. 02/09/17   Mar Daring, PA-C  cyclobenzaprine (FLEXERIL) 5 MG tablet Take 1 tablet (5 mg total) by mouth 3 (three) times daily as needed for muscle spasms. Patient not taking: Reported on 02/17/2017 09/18/16   Dannielle Karvonen Menshew, PA-C  dapagliflozin propanediol (FARXIGA) 10 MG TABS tablet Take 10 mg by mouth daily. 02/09/17   Mar Daring, PA-C  glipiZIDE (GLUCOTROL) 10 MG tablet TAKE ONE TABLET BY MOUTH TWICE DAILY 30 MINUTES BEFORE A MEAL 02/09/17   Mar Daring, PA-C  nabumetone (RELAFEN) 750 MG tablet Take 1 tablet (750 mg total) by mouth 2 (two) times daily. Patient not taking: Reported on 02/17/2017 09/18/16   Dannielle Karvonen Menshew, PA-C  NON FORMULARY  CPAP    Historical Provider, MD  ONE TOUCH ULTRA TEST test strip USE ONE STRIP TO CHECK GLUCOSE ONCE DAILY 02/11/16   Margarita Rana, MD  pioglitazone (ACTOS) 45 MG tablet Take 1 tablet (45 mg total) by mouth daily. 02/09/17   Mar Daring, PA-C  sitaGLIPtin (JANUVIA) 100 MG tablet Take 1 tablet (100 mg total) by mouth daily. 02/09/17   Mar Daring, PA-C    Allergies as of 02/10/2017  . (No Known Allergies)    Family History  Problem Relation Age of Onset  . Hyperlipidemia Mother   . Hypertension Mother   . Diabetes Mother   . Diabetes Father   . Hyperlipidemia Father   . Hypertension Father     Social History   Social History  . Marital status: Single    Spouse name: N/A  . Number of children: N/A  . Years of education: N/A   Occupational History  . Not on file.   Social History Main Topics  . Smoking status: Current Every Day Smoker    Packs/day: 1.00    Years: 30.00    Types: Cigarettes  . Smokeless tobacco: Never Used  . Alcohol use No  . Drug use: No  . Sexual activity: Not on file   Other Topics Concern  . Not on file   Social History Narrative  . No narrative on file    Review of  Systems: See HPI, otherwise negative ROS  Physical Exam: There were no vitals taken for this visit. General:   Alert,  pleasant and cooperative in NAD Head:  Normocephalic and atraumatic. Neck:  Supple; no masses or thyromegaly. Lungs:  Clear throughout to auscultation.    Heart:  Regular rate and rhythm. Abdomen:  Soft, nontender and nondistended. Normal bowel sounds, without guarding, and without rebound.   Neurologic:  Alert and  oriented x4;  grossly normal neurologically.  Impression/Plan: Jacqueline Sparks is now here to undergo a screening colonoscopy.  Risks, benefits, and alternatives regarding colonoscopy have been reviewed with the patient.  Questions have been answered.  All parties agreeable.

## 2017-02-17 NOTE — Anesthesia Preprocedure Evaluation (Addendum)
Anesthesia Evaluation  Patient identified by MRN, date of birth, ID band Patient awake    Reviewed: Allergy & Precautions, NPO status , Patient's Chart, lab work & pertinent test results  Airway Mallampati: III  TM Distance: <3 FB     Dental no notable dental hx. (+) Partial Upper   Pulmonary sleep apnea and Continuous Positive Airway Pressure Ventilation , COPD, Current Smoker,    Pulmonary exam normal        Cardiovascular hypertension, Normal cardiovascular exam     Neuro/Psych Anxiety    GI/Hepatic GERD  ,  Endo/Other  diabetes, Well Controlled, Type 2, Oral Hypoglycemic Agents  Renal/GU Renal disease     Musculoskeletal negative musculoskeletal ROS (+)   Abdominal Normal abdominal exam  (+)   Peds  Hematology negative hematology ROS (+)   Anesthesia Other Findings   Reproductive/Obstetrics                            Anesthesia Physical Anesthesia Plan  ASA: III  Anesthesia Plan: General   Post-op Pain Management:    Induction: Intravenous  Airway Management Planned: Nasal Cannula  Additional Equipment:   Intra-op Plan:   Post-operative Plan:   Informed Consent: I have reviewed the patients History and Physical, chart, labs and discussed the procedure including the risks, benefits and alternatives for the proposed anesthesia with the patient or authorized representative who has indicated his/her understanding and acceptance.   Dental advisory given  Plan Discussed with: CRNA and Surgeon  Anesthesia Plan Comments:         Anesthesia Quick Evaluation

## 2017-02-17 NOTE — Op Note (Signed)
[facility] Patient Name: Jacqueline Sparks Procedure Date: 02/17/2017 10:36 AM MRN: [id number] Account #: [id number] Date of Birth: [birth year] Admit Type: Outpatient Age: 56 Room: [facility] ENDO ROOM 4 Gender: Female Note Status: Finalized Procedure:            Colonoscopy Indications:          Screening for colorectal malignant neoplasm Providers:            Lucilla Lame MD, MD Referring MD:         Mar Daring (Referring MD) Medicines:            Propofol per Anesthesia Complications:        No immediate complications. Procedure:            Pre-Anesthesia Assessment:                       - Prior to the procedure, a History and Physical was                        performed, and patient medications and allergies were                        reviewed. The patient's tolerance of previous                        anesthesia was also reviewed. The risks and benefits of                        the procedure and the sedation options and risks were                        discussed with the patient. All questions were                        answered, and informed consent was obtained. Prior                        Anticoagulants: The patient has taken no previous                        anticoagulant or antiplatelet agents. ASA Grade                        Assessment: II - A patient with mild systemic disease.                        After reviewing the risks and benefits, the patient was                        deemed in satisfactory condition to undergo the                        procedure.                       After obtaining informed consent, the colonoscope was                        passed under direct vision. Throughout the procedure,  the patient's blood pressure, pulse, and oxygen                        saturations were monitored continuously. The                        Colonoscope was introduced through the anus and           advanced to the the cecum, identified by appendiceal                        orifice and ileocecal valve. The colonoscopy was                        performed without difficulty. The patient tolerated the                        procedure well. The quality of the bowel preparation                        was excellent. Findings:      The perianal and digital rectal examinations were normal.      A 5 mm polyp was found in the descending colon. The polyp was sessile.       The polyp was removed with a cold snare. Resection and retrieval were       complete.      Three sessile polyps were found in the sigmoid colon. The polyps were 3       to 4 mm in size. These polyps were removed with a cold biopsy forceps.       Resection and retrieval were complete.      Non-bleeding internal hemorrhoids were found during retroflexion. The       hemorrhoids were Grade I (internal hemorrhoids that do not prolapse). Impression:           - One 5 mm polyp in the descending colon, removed with                        a cold snare. Resected and retrieved.                       - Three 3 to 4 mm polyps in the sigmoid colon, removed                        with a cold biopsy forceps. Resected and retrieved.                       - Non-bleeding internal hemorrhoids. Recommendation:       - Discharge patient to home.                       - Resume previous diet.                       - Continue present medications.                       - Await pathology results.                       - Repeat colonoscopy in 5 years if polyp adenoma and 10  years if hyperplastic Procedure Code(s):    --- Professional ---                       [phone], Colonoscopy, flexible; with removal of tumor(s),                        polyp(s), or other lesion(s) by snare technique                       45380, 59, Colonoscopy, flexible; with biopsy, single                        or multiple Diagnosis Code(s):    ---  Professional ---                       Z12.11, Encounter for screening for malignant neoplasm                        of colon                       D12.4, Benign neoplasm of descending colon                       D12.5, Benign neoplasm of sigmoid colon CPT copyright 2016 American Medical Association. All rights reserved. The codes documented in this report are preliminary and upon coder review may  be revised to meet current compliance requirements. Lucilla Lame MD, MD 02/17/2017 11:05:48 AM This report has been signed electronically. Number of Addenda: 0 Note Initiated On: 02/17/2017 10:36 AM Scope Withdrawal Time: 0 hours 7 minutes 17 seconds  Total Procedure Duration: 0 hours 11 minutes 39 seconds       [facility]

## 2017-02-17 NOTE — Anesthesia Post-op Follow-up Note (Cosign Needed)
Anesthesia QCDR form completed.        

## 2017-02-18 ENCOUNTER — Encounter: Payer: Self-pay | Admitting: Gastroenterology

## 2017-02-18 LAB — SURGICAL PATHOLOGY

## 2017-02-18 NOTE — Anesthesia Postprocedure Evaluation (Signed)
Anesthesia Post Note  Patient: Jacqueline Sparks  Procedure(s) Performed: Procedure(s) (LRB): COLONOSCOPY WITH PROPOFOL (N/A)  Patient location during evaluation: PACU Anesthesia Type: General Level of consciousness: awake and alert and oriented Pain management: pain level controlled Vital Signs Assessment: post-procedure vital signs reviewed and stable Respiratory status: spontaneous breathing Cardiovascular status: blood pressure returned to baseline Anesthetic complications: no     Last Vitals:  Vitals:   02/17/17 1119 02/17/17 1139  BP: 132/83 (!) 148/81  Pulse: 85 71  Resp: 16 17  Temp:      Last Pain:  Vitals:   02/17/17 1109  TempSrc: Tympanic  PainSc: 0-No pain                 Doreather Hoxworth

## 2017-02-19 ENCOUNTER — Encounter: Payer: Self-pay | Admitting: Gastroenterology

## 2017-03-10 ENCOUNTER — Encounter: Payer: Self-pay | Admitting: Obstetrics & Gynecology

## 2017-03-10 ENCOUNTER — Ambulatory Visit (INDEPENDENT_AMBULATORY_CARE_PROVIDER_SITE_OTHER): Payer: Federal, State, Local not specified - PPO | Admitting: Obstetrics & Gynecology

## 2017-03-10 VITALS — BP 130/80 | HR 81 | Ht 63.0 in | Wt 171.0 lb

## 2017-03-10 DIAGNOSIS — R8789 Other abnormal findings in specimens from female genital organs: Secondary | ICD-10-CM

## 2017-03-10 DIAGNOSIS — R87618 Other abnormal cytological findings on specimens from cervix uteri: Secondary | ICD-10-CM

## 2017-03-10 DIAGNOSIS — R87610 Atypical squamous cells of undetermined significance on cytologic smear of cervix (ASC-US): Secondary | ICD-10-CM | POA: Diagnosis not present

## 2017-03-10 NOTE — Patient Instructions (Signed)

## 2017-03-10 NOTE — Progress Notes (Signed)
Referring Provider:  Quinn Plowman, PA-C  HPI:  Jacqueline Sparks is a 56 y.o.  [id number]  who presents today for evaluation and management of abnormal cervical cytology.  Pt denies abnormal bleeding, pain, or dyspareunia, but is not currently sexually active. No recent drug use or infection.  No known risk factors that would affect immune system, min stress.  Pt does have diabetes.  Dysplasia History:  High Risk HPV on recent PAP. Prior PAP 2016 normal.  ROS:  Pertinent items noted in HPI and remainder of comprehensive ROS otherwise negative.  OB History  Gravida Para Term Preterm AB Living  3 2 2   1 2   SAB TAB Ectopic Multiple Live Births  1            # Outcome Date GA Lbr Len/2nd Weight Sex Delivery Anes PTL Lv  3 Term 01/19/98   8 lb (3.629 kg) M Vag-Spont     2 Term 09/26/88   6 lb (2.722 kg) M Vag-Spont     1 SAB               Past Medical History:  Diagnosis Date  . Anxiety   . Bronchitis   . Diabetes mellitus without complication ([place])   . GERD (gastroesophageal reflux disease)   . Hyperlipidemia   . Sleep apnea     Past Surgical History:  Procedure Laterality Date  . CARPAL TUNNEL RELEASE  2001   as staated this was the left side, right side was completed in 2003  . COLONOSCOPY WITH PROPOFOL N/A 02/17/2017   Procedure: COLONOSCOPY WITH PROPOFOL;  Surgeon: Lucilla Lame, MD;  Location: [facility] ENDOSCOPY;  Service: Endoscopy;  Laterality: N/A;  . DILATION AND CURETTAGE OF UTERUS  2008   as stated uterine polyps  . ROTATOR CUFF REPAIR Right 3004 and 2010  . TUBAL LIGATION  1999    SOCIAL HISTORY:  History  Alcohol Use No    History  Drug Use No     Family History  Problem Relation Age of Onset  . Hyperlipidemia Mother   . Hypertension Mother   . Diabetes Mother   . Diabetes Father   . Hyperlipidemia Father   . Hypertension Father     ALLERGIES:  Patient has no known allergies.  Current Outpatient Prescriptions on File Prior to Visit  Medication Sig  Dispense Refill  . atorvastatin (LIPITOR) 20 MG tablet Take 1 tablet (20 mg total) by mouth daily. 30 tablet 5  . dapagliflozin propanediol (FARXIGA) 10 MG TABS tablet Take 10 mg by mouth daily. 30 tablet 5  . glipiZIDE (GLUCOTROL) 10 MG tablet TAKE ONE TABLET BY MOUTH TWICE DAILY 30 MINUTES BEFORE A MEAL 60 tablet 5  . pioglitazone (ACTOS) 45 MG tablet Take 1 tablet (45 mg total) by mouth daily. 30 tablet 5  . sitaGLIPtin (JANUVIA) 100 MG tablet Take 1 tablet (100 mg total) by mouth daily. 30 tablet 5  . ALPRAZolam (XANAX) 0.5 MG tablet TAKE ONE-HALF TO ONE TABLET BY MOUTH TWICE DAILY AS NEEDED (Patient not taking: Reported on 03/10/2017) 60 tablet 5  . clobetasol ointment (TEMOVATE) 0.05 % Apply topically 2 (two) times daily. (Patient not taking: Reported on 03/10/2017) 60 g 5  . cyclobenzaprine (FLEXERIL) 5 MG tablet Take 1 tablet (5 mg total) by mouth 3 (three) times daily as needed for muscle spasms. (Patient not taking: Reported on 02/17/2017) 15 tablet 0  . nabumetone (RELAFEN) 750 MG tablet Take 1 tablet (750 mg total) by mouth  2 (two) times daily. (Patient not taking: Reported on 02/17/2017) 30 tablet 0  . NON FORMULARY CPAP    . ONE TOUCH ULTRA TEST test strip USE ONE STRIP TO CHECK GLUCOSE ONCE DAILY (Patient not taking: Reported on 03/10/2017) 100 each 1   No current facility-administered medications on file prior to visit.     Physical Exam: -Vitals:  BP 130/80   Pulse 81   Ht 5\' 3"  (1.6 m)   Wt 171 lb (77.6 kg)   BMI 30.29 kg/m  GEN: WD, WN, NAD.  A+ O x 3, good mood and affect. ABD:  NT, ND.  Soft, no masses.  No hernias noted.   Pelvic:   Vulva: Normal appearance.  No lesions.  Vagina: No lesions or abnormalities noted.  Support: Normal pelvic support.  Urethra No masses tenderness or scarring.  Meatus Normal size without lesions or prolapse.  Cervix: See below.  Anus: Normal exam.  No lesions.  Perineum: Normal exam.  No lesions.        Bimanual   Uterus: Normal size.   Non-tender.  Mobile.  AV.  Adnexae: No masses.  Non-tender to palpation.  Cul-de-sac: Negative for abnormality.   PROCEDURE: 1.  Urine Pregnancy Test:  not done 2.  Colposcopy performed with 4% acetic acid after verbal consent obtained                                         -Aceto-white Lesions Location(s): 12 o'clock.              -Biopsy performed at 12 o'clock               -ECC indicated and performed: Yes.       -Biopsy sites made hemostatic with pressure, AgNO3, and/or Monsel's solution   -Satisfactory colposcopy: Yes.      -Evidence of Invasive cervical CA :  NO  ASSESSMENT:  Jacqueline Sparks is a 56 y.o. [id number] here for  1. Abnormal Papanicolaou smear of cervix with positive human papilloma virus (HPV) test   .  PLAN: 1.  I discussed the grading system of pap smears and HPV high risk viral types.  We will discuss and base management after colpo results return. 2. Follow up PAP 6 months, vs intervention if high grade dysplasia identified 3. Treatment of persistantly abnormal PAP smears and cervical dysplasia, even mild, is discussed w pt today in detail, as well as the pros and cons of Cryo and LEEP procedures. Will consider and discuss after results.      Barnett Applebaum, MD, [facility], [facility] 03/10/2017  2:19 PM

## 2017-03-13 LAB — PATHOLOGY

## 2017-04-10 ENCOUNTER — Encounter: Payer: Self-pay | Admitting: Physician Assistant

## 2017-04-10 ENCOUNTER — Ambulatory Visit (INDEPENDENT_AMBULATORY_CARE_PROVIDER_SITE_OTHER): Payer: Federal, State, Local not specified - PPO | Admitting: Physician Assistant

## 2017-04-10 ENCOUNTER — Other Ambulatory Visit: Payer: Self-pay | Admitting: Physician Assistant

## 2017-04-10 VITALS — BP 132/72 | HR 83 | Temp 98.4°F | Resp 16 | Wt 173.8 lb

## 2017-04-10 DIAGNOSIS — F419 Anxiety disorder, unspecified: Secondary | ICD-10-CM

## 2017-04-10 DIAGNOSIS — E119 Type 2 diabetes mellitus without complications: Secondary | ICD-10-CM

## 2017-04-10 DIAGNOSIS — M79675 Pain in left toe(s): Secondary | ICD-10-CM | POA: Diagnosis not present

## 2017-04-10 MED ORDER — ALPRAZOLAM 0.5 MG PO TABS: ORAL_TABLET | ORAL | 5 refills | Status: DC

## 2017-04-10 MED ORDER — MELOXICAM 7.5 MG PO TABS: 7.5000 mg | ORAL_TABLET | Freq: Every day | ORAL | 0 refills | Status: DC

## 2017-04-10 NOTE — Patient Instructions (Signed)
Gout Gout is painful swelling that can occur in some of your joints. Gout is a type of arthritis. This condition is caused by having too much uric acid in your body. Uric acid is a chemical that forms when your body breaks down substances called purines. Purines are important for building body proteins. When your body has too much uric acid, sharp crystals can form and build up inside your joints. This causes pain and swelling. Gout attacks can happen quickly and be very painful (acute gout). Over time, the attacks can affect more joints and become more frequent (chronic gout). Gout can also cause uric acid to build up under your skin and inside your kidneys. What are the causes? This condition is caused by too much uric acid in your blood. This can occur because:  Your kidneys do not remove enough uric acid from your blood. This is the most common cause.  Your body makes too much uric acid. This can occur with some cancers and cancer treatments. It can also occur if your body is breaking down too many red blood cells (hemolytic anemia).  You eat too many foods that are high in purines. These foods include organ meats and some seafood. Alcohol, especially beer, is also high in purines.  A gout attack may be triggered by trauma or stress. What increases the risk? This condition is more likely to develop in people who:  Have a family history of gout.  Are female and middle-aged.  Are female and have gone through menopause.  Are obese.  Frequently drink alcohol, especially beer.  Are dehydrated.  Lose weight too quickly.  Have an organ transplant.  Have lead poisoning.  Take certain medicines, including aspirin, cyclosporine, diuretics, levodopa, and niacin.  Have kidney disease or psoriasis.  What are the signs or symptoms? An attack of acute gout happens quickly. It usually occurs in just one joint. The most common place is the big toe. Attacks often start at night. Other joints  that may be affected include joints of the feet, ankle, knee, fingers, wrist, or elbow. Symptoms may include:  Severe pain.  Warmth.  Swelling.  Stiffness.  Tenderness. The affected joint may be very painful to touch.  Shiny, red, or purple skin.  Chills and fever.  Chronic gout may cause symptoms more frequently. More joints may be involved. You may also have white or yellow lumps (tophi) on your hands or feet or in other areas near your joints. How is this diagnosed? This condition is diagnosed based on your symptoms, medical history, and physical exam. You may have tests, such as:  Blood tests to measure uric acid levels.  Removal of joint fluid with a needle (aspiration) to look for uric acid crystals.  X-rays to look for joint damage.  How is this treated? Treatment for this condition has two phases: treating an acute attack and preventing future attacks. Acute gout treatment may include medicines to reduce pain and swelling, including:  NSAIDs.  Steroids. These are strong anti-inflammatory medicines that can be taken by mouth (orally) or injected into a joint.  Colchicine. This medicine relieves pain and swelling when it is taken soon after an attack. It can be given orally or through an IV tube.  Preventive treatment may include:  Daily use of smaller doses of NSAIDs or colchicine.  Use of a medicine that reduces uric acid levels in your blood.  Changes to your diet. You may need to see a specialist about healthy eating (dietitian).  Follow these instructions at home: During a Gout Attack  If directed, apply ice to the affected area: ? Put ice in a plastic bag. ? Place a towel between your skin and the bag. ? Leave the ice on for 20 minutes, 2-3 times a day.  Rest the joint as much as possible. If the affected joint is in your leg, you may be given crutches to use.  Raise (elevate) the affected joint above the level of your heart as often as  possible.  Drink enough fluids to keep your urine clear or pale yellow.  Take over-the-counter and prescription medicines only as told by your health care provider.  Do not drive or operate heavy machinery while taking prescription pain medicine.  Follow instructions from your health care provider about eating or drinking restrictions.  Return to your normal activities as told by your health care provider. Ask your health care provider what activities are safe for you. Avoiding Future Gout Attacks  Follow a low-purine diet as told by your dietitian or health care provider. Avoid foods and drinks that are high in purines, including liver, kidney, anchovies, asparagus, herring, mushrooms, mussels, and beer.  Limit alcohol intake to no more than 1 drink a day for nonpregnant women and 2 drinks a day for men. One drink equals 12 oz of beer, 5 oz of wine, or 1 oz of hard liquor.  Maintain a healthy weight or lose weight if you are overweight. If you want to lose weight, talk with your health care provider. It is important that you do not lose weight too quickly.  Start or maintain an exercise program as told by your health care provider.  Drink enough fluids to keep your urine clear or pale yellow.  Take over-the-counter and prescription medicines only as told by your health care provider.  Keep all follow-up visits as told by your health care provider. This is important. Contact a health care provider if:  You have another gout attack.  You continue to have symptoms of a gout attack after10 days of treatment.  You have side effects from your medicines.  You have chills or a fever.  You have burning pain when you urinate.  You have pain in your lower back or belly. Get help right away if:  You have severe or uncontrolled pain.  You cannot urinate. This information is not intended to replace advice given to you by your health care provider. Make sure you discuss any questions  you have with your health care provider. Document Released: 10/03/2000 Document Revised: 03/13/2016 Document Reviewed: 07/19/2015 Elsevier Interactive Patient Education  2017 [place] Toe Turf toe is an injury that affects the joint at the base of the big toe. It occurs when the toe is bent upward by force and extended beyond its normal limits (hyperextension). The joint of the big toe is surrounded by tissues (ligaments and tendons) that help to keep it in place. If any of these tissues are damaged, turf toe may result. Turf toe is a common sports injury. It can be mild if the tissue was stretched. It may be more severe if the tissue was partially or completely torn. Early treatment usually results in good recovery. In some cases, a person may continue to have some pain, joint stiffness, and reduced ability to push off from the affected foot. What are the causes? This injury is caused by extreme upward bending of the big toe joint. It can occur when:  Your toe  is pressed flat to the ground and your heel is raised while you push off forcefully with the front of your foot. For example, this could happen when you begin a sprint.  You push off on the ball of your foot repeatedly while running or jumping, especially on hard surfaces such as a basketball court.  You jam your toe from a force pushing into the toe.  What increases the risk? This injury is more likely to occur in people:  Who wear flexible shoes that do not offer good support while running or jumping.  Who participate in activities or sports that involve running and jumping on turf or hard surfaces, such as: ? Soccer. ? Football. ? Basketball. ? Volleyball. ? Gymnastics. ? Dancing. ? Wrestling.  What are the signs or symptoms? Symptoms of this condition include:  Pain at the base of the toe.  Swelling at the base of the toe.  Stiffness.  Limited movement of the toe.  Bruising.  If turf toe is the result  of a direct injury, symptoms may appear suddenly. If the condition is due to repetitive movements, such as running and jumping, the symptoms may develop gradually and worsen over time. How is this diagnosed? This condition may be diagnosed based on your symptoms, your medical history, and a physical exam of your foot. During the exam, the health care provider will check the range of motion of your toe by moving it up and down and from side to side. You may also have tests to confirm the diagnosis, such as:  X-rays to rule out bone problems, such as a fracture, a chip, or bones that are out of alignment.  MRI to view the soft tissue and cartilage in your toe. This will help to determine how severe your injury is.  How is this treated? Treatment for this condition depends on the severity of the injury. Treatment may include:  Rest, ice, compression, and elevation. This is often called the RICE strategy. It may be recommended if the injury is mild. Your health care provider may also restrict movement of your toe by taping it to the smaller toes.  Wearing a walking boot or a cast. For more serious turf toe, you may need to wear a walking boot for about one week. This will keep your toe from moving (immobilization). If you have severe turf toe, you may need to wear a cast or a walking boot for several weeks.  Over-the-counter medicines to relieve pain.  Physical therapy. Stretching and strengthening exercises can help to reduce or prevent joint stiffness in your toe.  Surgery. In rare cases, surgery may be needed for a severe injury if pain does not go away.  Follow these instructions at home: Managing pain, stiffness, and swelling  If directed, apply ice to the injured area: ? Put ice in a plastic bag. ? Place a towel between your skin and the bag. ? Leave the ice on for 20 minutes, 2-3 times per day.  Move your toes often to avoid stiffness and to lessen swelling.  Wear an elastic  compression bandage to help prevent or lessen swelling.  Raise (elevate) your foot above the level of your heart while you are sitting or lying down.  If you have a walking boot, wear it as told by your health care provider.  Do not use the injured foot to support (bear) your body weight until your health care provider says that you can. Use crutches as told by your  health care provider. Medicines  Take over-the-counter and prescription medicines only as told by your health care provider.  Do not drive or operate heavy machinery while taking prescription pain medicine. If you have a cast:  Do not put pressure on any part of the cast until it is fully hardened. This may take several hours.  Do not stick anything inside the cast to scratch your skin. Doing that increases your risk of infection.  Check the skin around the cast every day. Report any concerns to your health care provider. You may put lotion on dry skin around the edges of the cast. Do not apply lotion to the skin underneath the cast.  Do not let your cast get wet if it is not waterproof.  Keep the cast clean. General instructions  If your cast is not waterproof, cover it with a watertight plastic bag when you take a bath or a shower.  Return to your normal activities as told by your health care provider. Ask your health care provider what activities are safe for you.  Switch to less-flexible, more supportive footwear as told by your health care provider. Rigid shoe inserts (orthotics) can also reduce stress on your toes and improve stability.  Keep all follow-up visits as told by your health care provider. This is important. Contact a health care provider if:  You have new bruising or swelling in your toe.  The pain in your toe gets worse.  Your pain medicine is not helping. Get help right away if:  Your cast or walking boot becomes loose or damaged.  Your pain becomes severe.  Your toe becomes numb or changes  color.  Your toe joint feels unstable or is unable to bear any weight. This information is not intended to replace advice given to you by your health care provider. Make sure you discuss any questions you have with your health care provider. Document Released: 03/28/2002 Document Revised: 06/08/2016 Document Reviewed: 04/18/2015 Elsevier Interactive Patient Education  [employer].

## 2017-04-10 NOTE — Progress Notes (Signed)
Patient: Jacqueline Sparks Female    DOB: Sep 17, 1961   56 y.o.   MRN: [id number] Visit Date: 04/10/2017  Today's Provider: Mar Daring, PA-C   Chief Complaint  Patient presents with  . Toe Pain   Subjective:    HPI Patient is here today with c/o swelling on left big toe and painful. She reports it started to hurt Monday and is sore to touch. Is painful to walk and with shoe. Patient reports that several weeks ago her right big toe was hurting but it got better. No injury. There is no personal or family history of gout. No history of renal stones.   She is also requesting medication refill on the Alprazolam 0.5 mg. Report symptoms are stable.  Patient reports she is not taking [place] because it gives her yeast infection. She does continue to take glipizide 10mg , januvia 100mg , actos 45mg .     No Known Allergies   Current Outpatient Prescriptions:  .  ALPRAZolam (XANAX) 0.5 MG tablet, TAKE ONE-HALF TO ONE TABLET BY MOUTH TWICE DAILY AS NEEDED, Disp: 60 tablet, Rfl: 5 .  atorvastatin (LIPITOR) 20 MG tablet, Take 1 tablet (20 mg total) by mouth daily., Disp: 30 tablet, Rfl: 5 .  clobetasol ointment (TEMOVATE) 0.05 %, Apply topically 2 (two) times daily., Disp: 60 g, Rfl: 5 .  glipiZIDE (GLUCOTROL) 10 MG tablet, TAKE ONE TABLET BY MOUTH TWICE DAILY 30 MINUTES BEFORE A MEAL, Disp: 60 tablet, Rfl: 5 .  NON FORMULARY, CPAP, Disp: , Rfl:  .  ONE TOUCH ULTRA TEST test strip, USE ONE STRIP TO CHECK GLUCOSE ONCE DAILY, Disp: 100 each, Rfl: 1 .  pioglitazone (ACTOS) 45 MG tablet, Take 1 tablet (45 mg total) by mouth daily., Disp: 30 tablet, Rfl: 5 .  sitaGLIPtin (JANUVIA) 100 MG tablet, Take 1 tablet (100 mg total) by mouth daily., Disp: 30 tablet, Rfl: 5 .  cyclobenzaprine (FLEXERIL) 5 MG tablet, Take 1 tablet (5 mg total) by mouth 3 (three) times daily as needed for muscle spasms. (Patient not taking: Reported on 02/17/2017), Disp: 15 tablet, Rfl: 0 .  dapagliflozin propanediol  (FARXIGA) 10 MG TABS tablet, Take 10 mg by mouth daily. (Patient not taking: Reported on 04/10/2017), Disp: 30 tablet, Rfl: 5 .  nabumetone (RELAFEN) 750 MG tablet, Take 1 tablet (750 mg total) by mouth 2 (two) times daily. (Patient not taking: Reported on 02/17/2017), Disp: 30 tablet, Rfl: 0  Review of Systems  Constitutional: Negative for fatigue.  Respiratory: Negative for cough, chest tightness and shortness of breath.   Cardiovascular: Negative for chest pain, palpitations and leg swelling.  Musculoskeletal: Positive for arthralgias and joint swelling.  Skin: Negative for color change.    Social History  Substance Use Topics  . Smoking status: Current Every Day Smoker    Packs/day: 1.00    Years: 30.00    Types: Cigarettes  . Smokeless tobacco: Never Used  . Alcohol use No   Objective:   BP 132/72 (BP Location: Right Arm, Patient Position: Sitting, Cuff Size: Normal)   Pulse 83   Temp 98.4 F (36.9 C) (Oral)   Resp 16   Wt 173 lb 12.8 oz (78.8 kg)   BMI 30.79 kg/m    Physical Exam  Constitutional: She appears well-developed and well-nourished. No distress.  Cardiovascular: Normal rate, regular rhythm and normal heart sounds.  Exam reveals no gallop and no friction rub.   No murmur heard. Pulmonary/Chest: Effort normal and breath sounds normal.  No respiratory distress. She has no wheezes. She has no rales.  Musculoskeletal:       Feet:  Onychomycosis noted on all toe nails bilaterally  Skin: She is not diaphoretic.  Vitals reviewed.      Assessment & Plan:     1. Great toe pain, left DDx: MTP joint sprain, gout, arthritis, septic joint (very low suspicion). I will check labs as below to see if uric acid is elevtaed to indicate gout as source. It is the typical joint but toe was not swollen, erythematous, or warm. I will f/u pending lab results. Meloxicam was given as below for inflammation and pain. Advised to not take other NSAIDs. May use tylenol intermittently for  pain. I did splint the toe as well with a tongue depressor and advised her to get a splint at her local pharmacy to support the toe. She voiced understanding.  - Basic Metabolic Panel (BMET) - Uric acid - meloxicam (MOBIC) 7.5 MG tablet; Take 1 tablet (7.5 mg total) by mouth daily.  Dispense: 30 tablet; Refill: 0  2. Type 2 diabetes mellitus without complication, without long-term current use of insulin ([place]) Patient stopped farxiga. She continues to take Glipizide, actos and [place]. One month supply of Jardiance 25mg  were given to the patient to see if she will tolerate this better. She is due back for T2DM follow up on 05/11/17. She will keep this appt and we will see how she is tolerating the jardiance and recheck A1c.  3. Anxiety Stable. Diagnosis pulled for medication refill. Continue current medical treatment plan. - ALPRAZolam (XANAX) 0.5 MG tablet; TAKE ONE-HALF TO ONE TABLET BY MOUTH TWICE DAILY AS NEEDED  Dispense: 60 tablet; Refill: [place], PA-C  [facility]

## 2017-04-11 LAB — URIC ACID: Uric Acid: 5.3 mg/dL (ref 2.5–7.1)

## 2017-04-11 LAB — BASIC METABOLIC PANEL
BUN/Creatinine Ratio: 6 — ABNORMAL LOW (ref 9–23)
BUN: 5 mg/dL — ABNORMAL LOW (ref 6–24)
CHLORIDE: 99 mmol/L (ref 96–106)
CO2: 25 mmol/L (ref 20–29)
Calcium: 9.6 mg/dL (ref 8.7–10.2)
Creatinine, Ser: 0.86 mg/dL (ref 0.57–1.00)
GFR, EST AFRICAN AMERICAN: 89 mL/min/{1.73_m2} (ref >59)
GFR, EST NON AFRICAN AMERICAN: 77 mL/min/{1.73_m2} (ref >59)
Glucose: 269 mg/dL — ABNORMAL HIGH (ref 65–99)
Potassium: 4.5 mmol/L (ref 3.5–5.2)
SODIUM: 140 mmol/L (ref 134–144)

## 2017-04-13 DIAGNOSIS — E669 Obesity, unspecified: Secondary | ICD-10-CM | POA: Diagnosis not present

## 2017-04-13 DIAGNOSIS — R0602 Shortness of breath: Secondary | ICD-10-CM | POA: Diagnosis not present

## 2017-04-13 DIAGNOSIS — I2510 Atherosclerotic heart disease of native coronary artery without angina pectoris: Secondary | ICD-10-CM | POA: Diagnosis not present

## 2017-04-13 DIAGNOSIS — I208 Other forms of angina pectoris: Secondary | ICD-10-CM | POA: Diagnosis not present

## 2017-04-13 NOTE — Telephone Encounter (Signed)
Called into [facility]

## 2017-04-13 NOTE — Progress Notes (Signed)
Advised  ED 

## 2017-05-11 ENCOUNTER — Ambulatory Visit (INDEPENDENT_AMBULATORY_CARE_PROVIDER_SITE_OTHER): Payer: Federal, State, Local not specified - PPO | Admitting: Physician Assistant

## 2017-05-11 ENCOUNTER — Encounter: Payer: Self-pay | Admitting: Physician Assistant

## 2017-05-11 VITALS — BP 134/80 | HR 72 | Temp 98.2°F | Resp 16 | Wt 169.0 lb

## 2017-05-11 DIAGNOSIS — F4321 Adjustment disorder with depressed mood: Secondary | ICD-10-CM

## 2017-05-11 DIAGNOSIS — E7800 Pure hypercholesterolemia, unspecified: Secondary | ICD-10-CM | POA: Diagnosis not present

## 2017-05-11 DIAGNOSIS — E119 Type 2 diabetes mellitus without complications: Secondary | ICD-10-CM

## 2017-05-11 DIAGNOSIS — I10 Essential (primary) hypertension: Secondary | ICD-10-CM

## 2017-05-11 DIAGNOSIS — Z6829 Body mass index (BMI) 29.0-29.9, adult: Secondary | ICD-10-CM | POA: Diagnosis not present

## 2017-05-11 DIAGNOSIS — F419 Anxiety disorder, unspecified: Secondary | ICD-10-CM

## 2017-05-11 LAB — POCT UA - MICROALBUMIN: MICROALBUMIN (UR) POC: NEGATIVE mg/L

## 2017-05-11 LAB — POCT GLYCOSYLATED HEMOGLOBIN (HGB A1C): Hemoglobin A1C: 8.8

## 2017-05-11 MED ORDER — FLUCONAZOLE 150 MG PO TABS: 150.0000 mg | ORAL_TABLET | Freq: Once | ORAL | 0 refills | Status: AC

## 2017-05-11 MED ORDER — BUPROPION HCL ER (XL) 150 MG PO TB24: 150.0000 mg | ORAL_TABLET | Freq: Every day | ORAL | 3 refills | Status: DC

## 2017-05-11 MED ORDER — ALPRAZOLAM 0.5 MG PO TABS: ORAL_TABLET | ORAL | 5 refills | Status: DC

## 2017-05-11 MED ORDER — EMPAGLIFLOZIN 25 MG PO TABS: 25.0000 mg | ORAL_TABLET | Freq: Every day | ORAL | 5 refills | Status: DC

## 2017-05-11 NOTE — Patient Instructions (Signed)
Bupropion extended-release tablets (Depression/Mood Disorders) What is this medicine? BUPROPION (byoo PROE pee on) is used to treat depression. This medicine may be used for other purposes; ask your health care provider or pharmacist if you have questions. COMMON BRAND NAME(S): Aplenzin, Budeprion XL, Forfivo XL, Wellbutrin XL What should I tell my health care provider before I take this medicine? They need to know if you have any of these conditions: -an eating disorder, such as anorexia or bulimia -bipolar disorder or psychosis -diabetes or high blood sugar, treated with medication -glaucoma -head injury or brain tumor -heart disease, previous heart attack, or irregular heart beat -high blood pressure -kidney or liver disease -seizures (convulsions) -suicidal thoughts or a previous suicide attempt -Tourette's syndrome -weight loss -an unusual or allergic reaction to bupropion, other medicines, foods, dyes, or preservatives -breast-feeding -pregnant or trying to become pregnant How should I use this medicine? Take this medicine by mouth with a glass of water. Follow the directions on the prescription label. You can take it with or without food. If it upsets your stomach, take it with food. Do not crush, chew, or cut these tablets. This medicine is taken once daily at the same time each day. Do not take your medicine more often than directed. Do not stop taking this medicine suddenly except upon the advice of your doctor. Stopping this medicine too quickly may cause serious side effects or your condition may worsen. A special MedGuide will be given to you by the pharmacist with each prescription and refill. Be sure to read this information carefully each time. Talk to your pediatrician regarding the use of this medicine in children. Special care may be needed. Overdosage: If you think you have taken too much of this medicine contact a poison control center or emergency room at once. NOTE:  This medicine is only for you. Do not share this medicine with others. What if I miss a dose? If you miss a dose, skip the missed dose and take your next tablet at the regular time. Do not take double or extra doses. What may interact with this medicine? Do not take this medicine with any of the following medications: -linezolid -MAOIs like Azilect, Carbex, Eldepryl, Marplan, Nardil, and Parnate -methylene blue (injected into a vein) -other medicines that contain bupropion like Zyban This medicine may also interact with the following medications: -alcohol -certain medicines for anxiety or sleep -certain medicines for blood pressure like metoprolol, propranolol -certain medicines for depression or psychotic disturbances -certain medicines for HIV or AIDS like efavirenz, lopinavir, nelfinavir, ritonavir -certain medicines for irregular heart beat like propafenone, flecainide -certain medicines for Parkinson's disease like amantadine, levodopa -certain medicines for seizures like carbamazepine, phenytoin, phenobarbital -cimetidine -clopidogrel -cyclophosphamide -digoxin -furazolidone -isoniazid -nicotine -orphenadrine -procarbazine -steroid medicines like prednisone or cortisone -stimulant medicines for attention disorders, weight loss, or to stay awake -tamoxifen -theophylline -thiotepa -ticlopidine -tramadol -warfarin This list may not describe all possible interactions. Give your health care provider a list of all the medicines, herbs, non-prescription drugs, or dietary supplements you use. Also tell them if you smoke, drink alcohol, or use illegal drugs. Some items may interact with your medicine. What should I watch for while using this medicine? Tell your doctor if your symptoms do not get better or if they get worse. Visit your doctor or health care professional for regular checks on your progress. Because it may take several weeks to see the full effects of this medicine, it  is important to continue your treatment as   prescribed by your doctor. Patients and their families should watch out for new or worsening thoughts of suicide or depression. Also watch out for sudden changes in feelings such as feeling anxious, agitated, panicky, irritable, hostile, aggressive, impulsive, severely restless, overly excited and hyperactive, or not being able to sleep. If this happens, especially at the beginning of treatment or after a change in dose, call your health care professional. Avoid alcoholic drinks while taking this medicine. Drinking large amounts of alcoholic beverages, using sleeping or anxiety medicines, or quickly stopping the use of these agents while taking this medicine may increase your risk for a seizure. Do not drive or use heavy machinery until you know how this medicine affects you. This medicine can impair your ability to perform these tasks. Do not take this medicine close to bedtime. It may prevent you from sleeping. Your mouth may get dry. Chewing sugarless gum or sucking hard candy, and drinking plenty of water may help. Contact your doctor if the problem does not go away or is severe. The tablet shell for some brands of this medicine does not dissolve. This is normal. The tablet shell may appear whole in the stool. This is not a cause for concern. What side effects may I notice from receiving this medicine? Side effects that you should report to your doctor or health care professional as soon as possible: -allergic reactions like skin rash, itching or hives, swelling of the face, lips, or tongue -breathing problems -changes in vision -confusion -elevated mood, decreased need for sleep, racing thoughts, impulsive behavior -fast or irregular heartbeat -hallucinations, loss of contact with reality -increased blood pressure -redness, blistering, peeling or loosening of the skin, including inside the mouth -seizures -suicidal thoughts or other mood  changes -unusually weak or tired -vomiting Side effects that usually do not require medical attention (report to your doctor or health care professional if they continue or are bothersome): -constipation -headache -loss of appetite -nausea -tremors -weight loss This list may not describe all possible side effects. Call your doctor for medical advice about side effects. You may report side effects to FDA at 1-800-FDA-1088. Where should I keep my medicine? Keep out of the reach of children. Store at room temperature between 15 and 30 degrees C (59 and 86 degrees F). Throw away any unused medicine after the expiration date. NOTE: This sheet is a summary. It may not cover all possible information. If you have questions about this medicine, talk to your doctor, pharmacist, or health care provider.  2018 Elsevier/Gold Standard (2016-03-28 13:55:13)  

## 2017-05-11 NOTE — Progress Notes (Signed)
Patient: Myrikal Messmer Female    DOB: 1961-08-16   56 y.o.   MRN: [id number] Visit Date: 05/11/2017  Today's Provider: Mar Daring, PA-C   Chief Complaint  Patient presents with  . Diabetes  . Hyperlipidemia  . Hypertension   Subjective:    HPI    Diabetes Mellitus Type II, Follow-up:   Lab Results  Component Value Date   HGBA1C 8.8 05/11/2017   HGBA1C 7.9 02/09/2017   HGBA1C 7.4 (H) 11/24/2016   Last seen for diabetes 3 months ago.  Management since then includes:  Pt reports having to stop [place] and Jardiance secondary to yeast infections.. She reports fair compliance with treatment. She is not having side effects.  Current symptoms include foot ulcerations, polydipsia and polyuria and have been worsening. Home blood sugar records: Pt reports not checking her blood sugars at home.   Episodes of hypoglycemia? no   Current Insulin Regimen: N/A Most Recent Eye Exam: At least two years ago. Weight trend: stable Prior visit with dietician: yes -  Current diet: in general, a "healthy" diet   Current exercise: walking  ------------------------------------------------------------------------   Hypertension, follow-up:  BP Readings from Last 3 Encounters:  05/11/17 134/80  04/10/17 132/72  03/10/17 130/80    She was last seen for hypertension 3 months ago.  Management since that visit includes None.She reports excellent compliance with treatment. She is not having side effects.  She is exercising. She is adherent to low salt diet.   Outside blood pressures are Not being checked. She is experiencing none.  Patient denies chest pain, chest pressure/discomfort, lower extremity edema and near-syncope.   Cardiovascular risk factors include diabetes mellitus and dyslipidemia.  Use of agents associated with hypertension: decongestants.   ------------------------------------------------------------------------    Lipid/Cholesterol, Follow-up:    Last seen for this 3 months ago.  Management since that visit includes None.  Last Lipid Panel:    Component Value Date/Time   CHOL 162 11/24/2016 0853   TRIG 405 (H) 11/24/2016 0853   HDL 29 (L) 11/24/2016 0853   CHOLHDL 5.6 (H) 11/24/2016 0853   [place] Comment 11/24/2016 0853    She reports excellent compliance with treatment. She is not having side effects.   Wt Readings from Last 3 Encounters:  05/11/17 169 lb (76.7 kg)  04/10/17 173 lb 12.8 oz (78.8 kg)  03/10/17 171 lb (77.6 kg)    ------------------------------------------------------------------------    No Known Allergies   Current Outpatient Prescriptions:  .  ALPRAZolam (XANAX) 0.5 MG tablet, TAKE ONE-HALF TO ONE TABLET BY MOUTH TWICE DAILY AS NEEDED, Disp: 60 tablet, Rfl: 5 .  atorvastatin (LIPITOR) 20 MG tablet, Take 1 tablet (20 mg total) by mouth daily., Disp: 30 tablet, Rfl: 5 .  clobetasol ointment (TEMOVATE) 0.05 %, Apply topically 2 (two) times daily., Disp: 60 g, Rfl: 5 .  glipiZIDE (GLUCOTROL) 10 MG tablet, TAKE ONE TABLET BY MOUTH TWICE DAILY 30 MINUTES BEFORE A MEAL, Disp: 60 tablet, Rfl: 5 .  meloxicam (MOBIC) 7.5 MG tablet, Take 1 tablet (7.5 mg total) by mouth daily., Disp: 30 tablet, Rfl: 0 .  NON FORMULARY, CPAP, Disp: , Rfl:  .  pioglitazone (ACTOS) 45 MG tablet, Take 1 tablet (45 mg total) by mouth daily., Disp: 30 tablet, Rfl: 5 .  sitaGLIPtin (JANUVIA) 100 MG tablet, Take 1 tablet (100 mg total) by mouth daily., Disp: 30 tablet, Rfl: 5 .  ONE TOUCH ULTRA TEST test strip, USE ONE STRIP TO CHECK  GLUCOSE ONCE DAILY (Patient not taking: Reported on 05/11/2017), Disp: 100 each, Rfl: 1  Review of Systems  Constitutional: Negative for activity change, appetite change, chills, diaphoresis, fatigue, fever and unexpected weight change.  Respiratory: Negative.   Cardiovascular: Negative.   Gastrointestinal: Negative.   Endocrine: Positive for polydipsia and polyuria. Negative for cold intolerance,  heat intolerance and polyphagia.  Musculoskeletal: Negative.   Psychiatric/Behavioral: Positive for dysphoric mood and sleep disturbance. Negative for self-injury and suicidal ideas. The patient is nervous/anxious.     Social History  Substance Use Topics  . Smoking status: Current Every Day Smoker    Packs/day: 1.00    Years: 30.00    Types: Cigarettes  . Smokeless tobacco: Never Used  . Alcohol use No   Objective:   BP 134/80 (BP Location: Left Arm, Patient Position: Sitting, Cuff Size: Normal)   Pulse 72   Temp 98.2 F (36.8 C) (Oral)   Resp 16   Wt 169 lb (76.7 kg)   BMI 29.94 kg/m  Vitals:   05/11/17 0824  BP: 134/80  Pulse: 72  Resp: 16  Temp: 98.2 F (36.8 C)  TempSrc: Oral  Weight: 169 lb (76.7 kg)     Physical Exam  Constitutional: She appears well-developed and well-nourished. No distress.  Neck: Normal range of motion. Neck supple. No tracheal deviation present. No thyromegaly present.  Cardiovascular: Normal rate, regular rhythm and normal heart sounds.  Exam reveals no gallop and no friction rub.   No murmur heard. Pulmonary/Chest: Effort normal and breath sounds normal. No respiratory distress. She has no wheezes. She has no rales.  Lymphadenopathy:    She has no cervical adenopathy.  Skin: She is not diaphoretic.  Vitals reviewed.      Assessment & Plan:     1. Type 2 diabetes mellitus without complication, without long-term current use of insulin (HCC) A1c increased back to 8.8. Patient had only taking Jardiance for 4 days when she developed a yeast infection and self discontinued. Advised patient that yeats infections are common but we normally treat and do not discontinue the medication unless there are 3 consecutive yeast infections. Discussed mechanism of medication and why this happens early on but normally subsides. She voiced understanding and is willing to give it a try again. Microalbumin was negative. Jardiance refilled. I will recheck A1c  in 3 months.  - POCT glycosylated hemoglobin (Hb A1C) - POCT UA - Microalbumin - empagliflozin (JARDIANCE) 25 MG TABS tablet; Take 25 mg by mouth daily.  Dispense: 30 tablet; Refill: 5  2. Hypercholesteremia Stable. Continue atorvastatin 20mg .   3. Essential hypertension Stable.   4. BMI 29.0-29.9,adult Counseled patient on healthy lifestyle modifications including dieting and exercise. Patient has lost 4 pounds since last visit.   5. Anxiety Worsening. Reports she had 2 deaths in the family back to back and one of them was unexpected. She has had a few nights where she couldn't sleep at all. She also mentions increased stressors with her daily life. Will refill Xanax as below. Will also give wellbutrin as below. Patient desires to quit smoking thus we will try wellbutrin for smoking cessation and mood stabilization as below. I will see her back in 4 weeks to see how she is doing with this medication.  - ALPRAZolam (XANAX) 0.5 MG tablet; TAKE ONE-HALF TO ONE TABLET BY MOUTH TWICE DAILY AS NEEDED  Dispense: 60 tablet; Refill: 5 - buPROPion (WELLBUTRIN XL) 150 MG 24 hr tablet; Take 1 tablet (150 mg total)  by mouth daily.  Dispense: 30 tablet; Refill: 3  6. Situational depression See above medical treatment plan. - buPROPion (WELLBUTRIN XL) 150 MG 24 hr tablet; Take 1 tablet (150 mg total) by mouth daily.  Dispense: 30 tablet; Refill: [place], PA-C  [facility]

## 2017-06-01 ENCOUNTER — Ambulatory Visit (INDEPENDENT_AMBULATORY_CARE_PROVIDER_SITE_OTHER): Payer: Federal, State, Local not specified - PPO | Admitting: Physician Assistant

## 2017-06-01 ENCOUNTER — Encounter: Payer: Self-pay | Admitting: Physician Assistant

## 2017-06-01 VITALS — BP 110/60 | HR 78 | Temp 98.7°F | Resp 16 | Ht 63.0 in | Wt 165.8 lb

## 2017-06-01 DIAGNOSIS — F17200 Nicotine dependence, unspecified, uncomplicated: Secondary | ICD-10-CM

## 2017-06-01 DIAGNOSIS — F419 Anxiety disorder, unspecified: Secondary | ICD-10-CM | POA: Diagnosis not present

## 2017-06-01 DIAGNOSIS — E119 Type 2 diabetes mellitus without complications: Secondary | ICD-10-CM

## 2017-06-01 DIAGNOSIS — L409 Psoriasis, unspecified: Secondary | ICD-10-CM | POA: Insufficient documentation

## 2017-06-01 DIAGNOSIS — Z6829 Body mass index (BMI) 29.0-29.9, adult: Secondary | ICD-10-CM | POA: Diagnosis not present

## 2017-06-01 MED ORDER — DAPAGLIFLOZIN PROPANEDIOL 10 MG PO TABS: 10.0000 mg | ORAL_TABLET | Freq: Every day | ORAL | 3 refills | Status: DC

## 2017-06-01 NOTE — Patient Instructions (Signed)

## 2017-06-01 NOTE — Progress Notes (Signed)
Patient: Jacqueline Sparks Female    DOB: 02-02-1961   56 y.o.   MRN: [id number] Visit Date: 06/01/2017  Today's Provider: Mar Daring, PA-C   Chief Complaint  Patient presents with  . Diabetes  . Anxiety  . Follow-up   Subjective:    HPI  Diabetes Mellitus Type II, Follow-up:   Lab Results  Component Value Date   HGBA1C 8.8 05/11/2017   HGBA1C 7.9 02/09/2017   HGBA1C 7.4 (H) 11/24/2016    Last seen for diabetes 3 weeks ago.  Management since then includes talking about possible side effects of Jardiance and to restart medication. She did not fill due to cost but s taking Farxiga 10mg .  She reports good compliance with treatment. She is not having side effects.  Current symptoms include none and have been stable. Home blood sugar records: fasting range: 171 yesterday  Episodes of hypoglycemia? no   Current Insulin Regimen: none Most Recent Eye Exam: UTD Weight trend: stable Prior visit with dietician: no Current diet: in general, a "healthy" diet   Current exercise: none  Pertinent Labs:    Component Value Date/Time   CHOL 162 11/24/2016 0853   TRIG 405 (H) 11/24/2016 0853   HDL 29 (L) 11/24/2016 0853   LDLCALC Comment 11/24/2016 0853   CREATININE 0.86 04/10/2017 0957    Wt Readings from Last 3 Encounters:  06/01/17 165 lb 12.8 oz (75.2 kg)  05/11/17 169 lb (76.7 kg)  04/10/17 173 lb 12.8 oz (78.8 kg)   ------------------------------------------------------------------------   Anxiety, Follow-up  She  was last seen for this 3 weeks ago. Changes made at last visit include start wellbutrin and refill Xanax.   She reports excellent compliance with treatment. She is not having side effects.   She reports excellent tolerance of treatment. Current symptoms include: weight loss She feels she is Improved since last visit.  ------------------------------------------------------------------------   Follow up for weight  The patient was last  seen for this 3 weeks ago. Changes made at last visit include patient advised to continue working on diet and exercise.  She reports good compliance with treatment. She feels that condition is Unchanged. She is not having side effects. ------------------------------------------------------------------------------------    No Known Allergies   Current Outpatient Prescriptions:  .  ALPRAZolam (XANAX) 0.5 MG tablet, TAKE ONE-HALF TO ONE TABLET BY MOUTH TWICE DAILY AS NEEDED, Disp: 60 tablet, Rfl: 5 .  atorvastatin (LIPITOR) 20 MG tablet, Take 1 tablet (20 mg total) by mouth daily., Disp: 30 tablet, Rfl: 5 .  buPROPion (WELLBUTRIN XL) 150 MG 24 hr tablet, Take 1 tablet (150 mg total) by mouth daily., Disp: 30 tablet, Rfl: 3 .  clobetasol ointment (TEMOVATE) 0.05 %, Apply topically 2 (two) times daily., Disp: 60 g, Rfl: 5 .  dapagliflozin propanediol (FARXIGA) 10 MG TABS tablet, Take 10 mg by mouth daily., Disp: , Rfl:  .  glipiZIDE (GLUCOTROL) 10 MG tablet, TAKE ONE TABLET BY MOUTH TWICE DAILY 30 MINUTES BEFORE A MEAL, Disp: 60 tablet, Rfl: 5 .  meloxicam (MOBIC) 7.5 MG tablet, Take 1 tablet (7.5 mg total) by mouth daily., Disp: 30 tablet, Rfl: 0 .  NON FORMULARY, CPAP, Disp: , Rfl:  .  ONE TOUCH ULTRA TEST test strip, USE ONE STRIP TO CHECK GLUCOSE ONCE DAILY, Disp: 100 each, Rfl: 1 .  pioglitazone (ACTOS) 45 MG tablet, Take 1 tablet (45 mg total) by mouth daily., Disp: 30 tablet, Rfl: 5 .  sitaGLIPtin (JANUVIA) 100 MG  tablet, Take 1 tablet (100 mg total) by mouth daily., Disp: 30 tablet, Rfl: 5 .  empagliflozin (JARDIANCE) 25 MG TABS tablet, Take 25 mg by mouth daily. (Patient not taking: Reported on 06/01/2017), Disp: 30 tablet, Rfl: 5  Review of Systems  Constitutional: Negative.   Respiratory: Negative.   Cardiovascular: Negative.   Endocrine: Negative.   Psychiatric/Behavioral: Negative.     Social History  Substance Use Topics  . Smoking status: Current Every Day Smoker     Packs/day: 1.00    Years: 30.00    Types: Cigarettes  . Smokeless tobacco: Never Used  . Alcohol use No   Objective:   BP 110/60 (BP Location: Left Arm, Patient Position: Sitting, Cuff Size: Large)   Pulse 78   Temp 98.7 F (37.1 C) (Oral)   Resp 16   Ht 5\' 3"  (1.6 m)   Wt 165 lb 12.8 oz (75.2 kg)   SpO2 98%   BMI 29.37 kg/m  Vitals:   06/01/17 0831  BP: 110/60  Pulse: 78  Resp: 16  Temp: 98.7 F (37.1 C)  TempSrc: Oral  SpO2: 98%  Weight: 165 lb 12.8 oz (75.2 kg)  Height: 5\' 3"  (1.6 m)     Physical Exam  Constitutional: She appears well-developed and well-nourished. No distress.  Neck: Normal range of motion. Neck supple. No JVD present. No tracheal deviation present. No thyromegaly present.  Cardiovascular: Normal rate, regular rhythm and normal heart sounds.  Exam reveals no gallop and no friction rub.   No murmur heard. Pulmonary/Chest: Effort normal and breath sounds normal. No respiratory distress. She has no wheezes. She has no rales.  Musculoskeletal: She exhibits no edema.  Lymphadenopathy:    She has no cervical adenopathy.  Skin: She is not diaphoretic.  Psoriasis flare on hands and feet  Vitals reviewed.       Assessment & Plan:     1. Type 2 diabetes mellitus without complication, without long-term current use of insulin ([place]) Continue farxiga. Will check labs in 2-3 months.  - dapagliflozin propanediol (FARXIGA) 10 MG TABS tablet; Take 10 mg by mouth daily.  Dispense: 30 tablet; Refill: 3  2. Anxiety Improving with wellbutrin and alprazolam. Doing self coping in the morning which is helping as well.   3. BMI 29.0-29.9,adult Patient is down another 4 pounds since last week. Continue diet and exercise.   4. Compulsive tobacco user syndrome Discussed smoking cessation for 3 minutes.  She is going to try to cut back one cigarette weekly. Also discussed picking a quit date as well.   5. Psoriasis Discussed adding a medicated lotion such as  eucerin, lubriderm or gold bond to help. Discussed putting on at night then covering with cotton gloves. Also continue clobetasol. Trying to avoid steroids due to elevated blood sugars.       Mar Daring, PA-C  [facility]

## 2017-06-05 ENCOUNTER — Telehealth: Payer: Self-pay

## 2017-06-05 NOTE — Telephone Encounter (Signed)
Called patient to remind her to please schedule mammogram. Patient verbalizes understanding and reports that she will call ASAP to schedule. sd

## 2017-06-30 ENCOUNTER — Ambulatory Visit (INDEPENDENT_AMBULATORY_CARE_PROVIDER_SITE_OTHER): Admitting: Orthopedic Surgery

## 2017-06-30 ENCOUNTER — Encounter (INDEPENDENT_AMBULATORY_CARE_PROVIDER_SITE_OTHER): Payer: Self-pay | Admitting: Orthopedic Surgery

## 2017-06-30 DIAGNOSIS — M25511 Pain in right shoulder: Secondary | ICD-10-CM

## 2017-06-30 DIAGNOSIS — M7541 Impingement syndrome of right shoulder: Secondary | ICD-10-CM

## 2017-06-30 NOTE — Progress Notes (Signed)
Office Visit Note   Patient: Jacqueline Sparks           Date of Birth: [birth year]           MRN: [id number] Visit Date: 06/30/2017              Requested by: Mar Daring, PA-C [address] [place], [place] [address] PCP: Mar Daring, PA-C  Chief Complaint  Patient presents with  . Right Shoulder - Pain      HPI: Patient presents for follow-up for her right shoulder. She states she recently has had a flareup with increasing pain with activities of daily living. She complains of pain with overhead activities pain with reaching behind or self pain with her arm swelling at her side. Patient is right hand dominant.  Assessment & Plan: Visit Diagnoses:  1. Impingement syndrome of right shoulder     Plan: Recommended continuing with anti-inflammatories as needed continue with her work restrictions with maximum lifting 10 pounds no climbing no overhead work  Follow-Up Instructions: Return if symptoms worsen or fail to improve.   Ortho Exam  Patient is alert, oriented, no adenopathy, well-dressed, normal affect, normal respiratory effort. Examination patient lacks about 20 of flexion and abduction on the right shoulder compared to the left. She has external rotation of 70 internal rotation of 45. She has pain palpation over the before meals joint pain to palpation over the biceps tendon. Neer and Hawkins impingement tests are positive.  Imaging: No results found. No images are attached to the encounter.  Labs: Lab Results  Component Value Date   HGBA1C 8.8 05/11/2017   HGBA1C 7.9 02/09/2017   HGBA1C 7.4 (H) 11/24/2016   LABURIC 5.3 04/10/2017    Orders:  No orders of the defined types were placed in this encounter.  No orders of the defined types were placed in this encounter.    Procedures: No procedures performed  Clinical Data: No additional findings.  ROS:  All other systems negative, except as noted in the HPI. Review of  Systems  Objective: Vital Signs: There were no vitals taken for this visit.  Specialty Comments:  No specialty comments available.  PMFS History: Patient Active Problem List   Diagnosis Date Noted  . Psoriasis 06/01/2017  . Polyp of sigmoid colon   . Benign neoplasm of descending colon   . Impingement syndrome of left shoulder 10/06/2016  . Impingement syndrome of right shoulder 10/06/2016  . BP (high blood pressure) 11/12/2015  . Abnormal abdominal MRI 11/09/2015  . Abnormal ECG 11/09/2015  . Anxiety 11/09/2015  . Adiposity 01/30/2014  . Smoking 01/30/2014  . Avitaminosis D 05/03/2010  . Obstructive apnea 06/09/2007  . Hypercholesteremia 06/09/2007  . Compulsive tobacco user syndrome 06/09/2007  . Renal tubular disorder 05/19/2007  . Diabetes mellitus, type 2 ([place]) 06/04/2006  . Apnea, sleep 06/04/2006   Past Medical History:  Diagnosis Date  . Anxiety   . Bronchitis   . Diabetes mellitus without complication ([place])   . GERD (gastroesophageal reflux disease)   . Hyperlipidemia   . Sleep apnea     Family History  Problem Relation Age of Onset  . Hyperlipidemia Mother   . Hypertension Mother   . Diabetes Mother   . Diabetes Father   . Hyperlipidemia Father   . Hypertension Father     Past Surgical History:  Procedure Laterality Date  . CARPAL TUNNEL RELEASE  2001   as staated this was the left side, right  side was completed in 2003  . COLONOSCOPY WITH PROPOFOL N/A 02/17/2017   Procedure: COLONOSCOPY WITH PROPOFOL;  Surgeon: Lucilla Lame, MD;  Location: [facility] ENDOSCOPY;  Service: Endoscopy;  Laterality: N/A;  . DILATION AND CURETTAGE OF UTERUS  2008   as stated uterine polyps  . ROTATOR CUFF REPAIR Right 3004 and 2010  . TUBAL LIGATION  1999   Social History   Occupational History  . Not on file.   Social History Main Topics  . Smoking status: Current Every Day Smoker    Packs/day: 1.00    Years: 30.00    Types: Cigarettes  . Smokeless tobacco: Never Used   . Alcohol use No  . Drug use: No  . Sexual activity: No

## 2017-08-12 IMAGING — CR DG SHOULDER 2+V*L*
1 series · 3 of 3 positions shown · non-contrast
Comparison: None.

CLINICAL DATA: 55-year-old female with left shoulder pain following
injury today. Initial encounter.

EXAM:
LEFT SHOULDER - 2+ VIEW

[Series 1: w shoulder external left · 0.14mm/px · 3 of 3 slices shown]
[im 1/3]
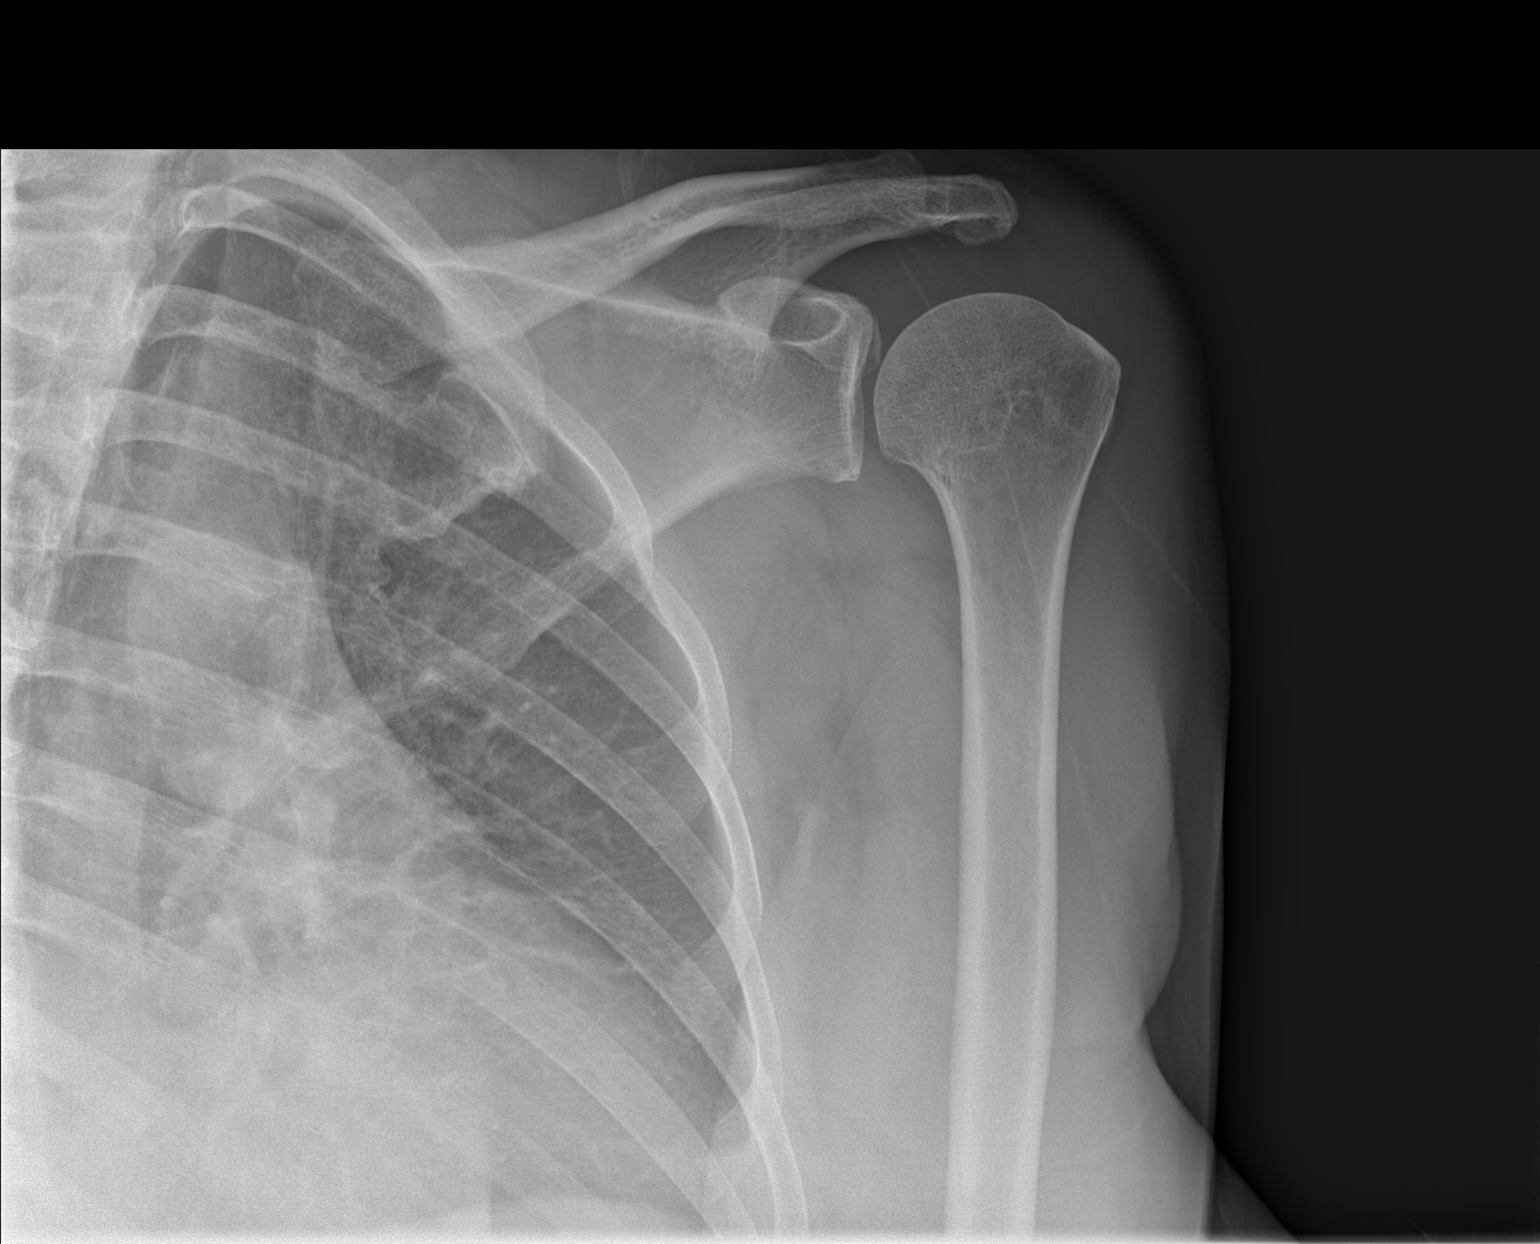
[im 2/3]
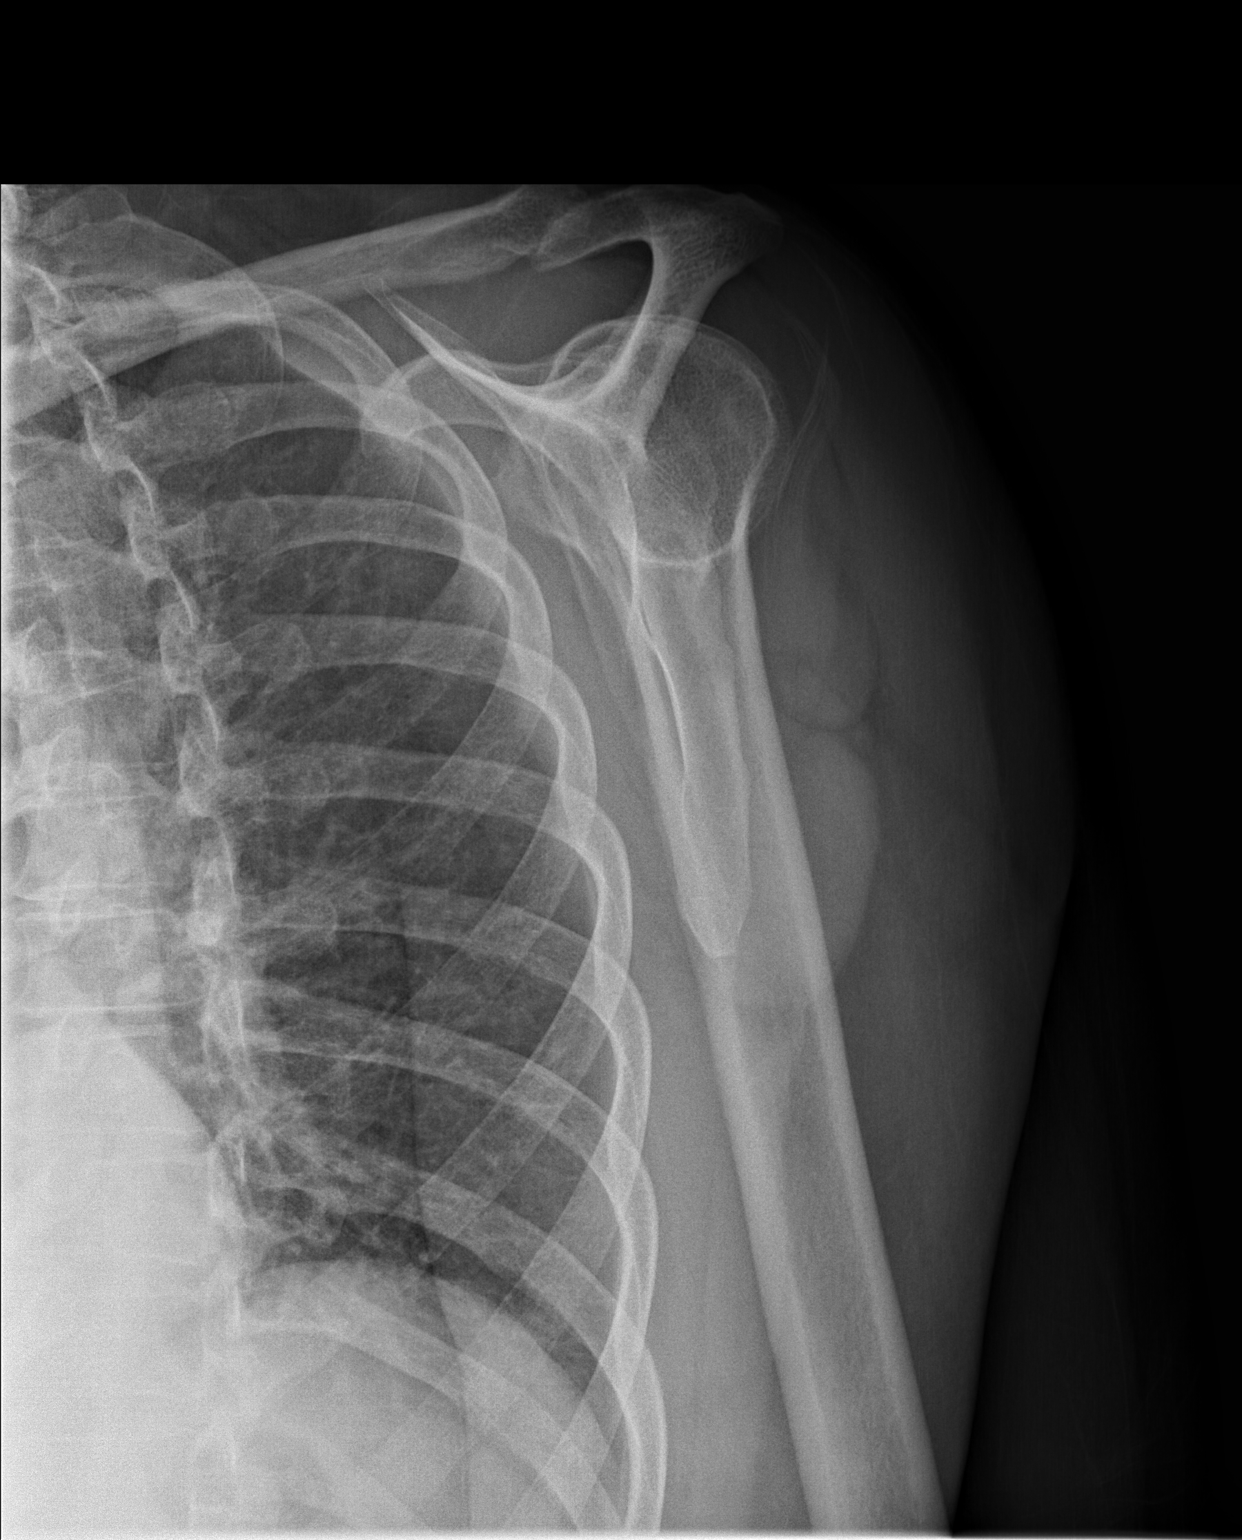
[im 3/3]
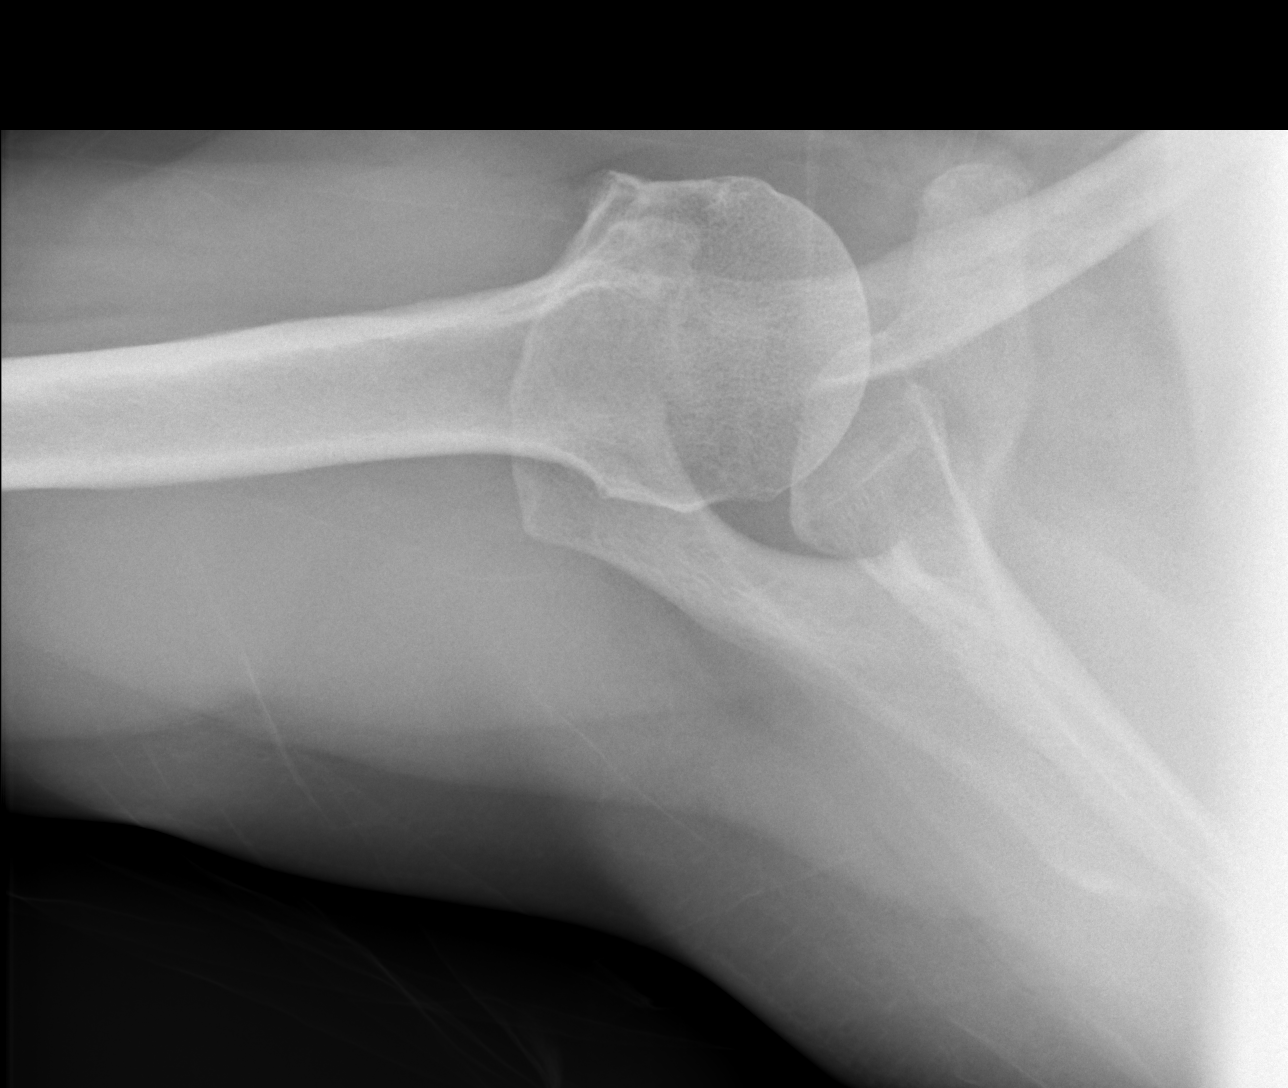

[3 of 3 positions shown; findings below may reference images not displayed]

FINDINGS: There is no evidence of fracture or dislocation. There is no
evidence of arthropathy or other focal bone abnormality. Soft
tissues are unremarkable.
IMPRESSION: Negative.

## 2017-08-18 ENCOUNTER — Encounter: Payer: Self-pay | Admitting: Physician Assistant

## 2017-08-18 ENCOUNTER — Ambulatory Visit (INDEPENDENT_AMBULATORY_CARE_PROVIDER_SITE_OTHER): Payer: Federal, State, Local not specified - PPO | Admitting: Physician Assistant

## 2017-08-18 VITALS — BP 100/60 | HR 87 | Temp 99.0°F | Resp 16 | Ht 63.0 in | Wt 163.6 lb

## 2017-08-18 DIAGNOSIS — E119 Type 2 diabetes mellitus without complications: Secondary | ICD-10-CM

## 2017-08-18 LAB — POCT GLYCOSYLATED HEMOGLOBIN (HGB A1C)
ESTIMATED AVERAGE GLUCOSE: 318
HEMOGLOBIN A1C: 12.7

## 2017-08-18 MED ORDER — DULAGLUTIDE 0.75 MG/0.5ML ~~LOC~~ SOAJ: 0.7500 mg | SUBCUTANEOUS | 5 refills | Status: DC

## 2017-08-18 MED ORDER — ALCOHOL PREP PADS: MEDICATED_PAD | 3 refills | Status: DC

## 2017-08-18 NOTE — Progress Notes (Signed)
Patient: Jacqueline Sparks Female    DOB: [birth year]   56 y.o.   MRN: [id number] Visit Date: 08/18/2017  Today's Provider: Mar Daring, PA-C   Chief Complaint  Patient presents with  . Diabetes   Subjective:    HPI  Diabetes Mellitus Type II, Follow-up:   Lab Results  Component Value Date   HGBA1C 12.7 08/18/2017   HGBA1C 8.8 05/11/2017   HGBA1C 7.9 02/09/2017    Last seen for diabetes 3 months ago.  Management since then includes check labs. She reports excellent compliance with treatment. She is not having side effects.  Current symptoms include none and have been stable. Home blood sugar records: fasting range: 190-200  Episodes of hypoglycemia? no   Current Insulin Regimen:  Most Recent Eye Exam:  Weight trend: stable Prior visit with dietician: no Current diet: in general, a "healthy" diet   Current exercise: housecleaning  Pertinent Labs:    Component Value Date/Time   CHOL 162 11/24/2016 0853   TRIG 405 (H) 11/24/2016 0853   HDL 29 (L) 11/24/2016 0853   LDLCALC Comment 11/24/2016 0853   CREATININE 0.86 04/10/2017 0957    Wt Readings from Last 3 Encounters:  08/18/17 163 lb 9.6 oz (74.2 kg)  06/01/17 165 lb 12.8 oz (75.2 kg)  05/11/17 169 lb (76.7 kg)    ------------------------------------------------------------------------      No Known Allergies   Current Outpatient Prescriptions:  .  ALPRAZolam (XANAX) 0.5 MG tablet, TAKE ONE-HALF TO ONE TABLET BY MOUTH TWICE DAILY AS NEEDED, Disp: 60 tablet, Rfl: 5 .  atorvastatin (LIPITOR) 20 MG tablet, Take 1 tablet (20 mg total) by mouth daily., Disp: 30 tablet, Rfl: 5 .  buPROPion (WELLBUTRIN XL) 150 MG 24 hr tablet, Take 1 tablet (150 mg total) by mouth daily., Disp: 30 tablet, Rfl: 3 .  clobetasol ointment (TEMOVATE) 0.05 %, Apply topically 2 (two) times daily., Disp: 60 g, Rfl: 5 .  glipiZIDE (GLUCOTROL) 10 MG tablet, TAKE ONE TABLET BY MOUTH TWICE DAILY 30 MINUTES BEFORE A MEAL, Disp:  60 tablet, Rfl: 5 .  meloxicam (MOBIC) 7.5 MG tablet, Take 1 tablet (7.5 mg total) by mouth daily., Disp: 30 tablet, Rfl: 0 .  NON FORMULARY, CPAP, Disp: , Rfl:  .  ONE TOUCH ULTRA TEST test strip, USE ONE STRIP TO CHECK GLUCOSE ONCE DAILY, Disp: 100 each, Rfl: 1 .  pioglitazone (ACTOS) 45 MG tablet, Take 1 tablet (45 mg total) by mouth daily., Disp: 30 tablet, Rfl: 5 .  Alcohol Swabs (ALCOHOL PREP) PADS, For cleaning skin once weekly before injection, Disp: 100 each, Rfl: 3 .  dapagliflozin propanediol (FARXIGA) 10 MG TABS tablet, Take 10 mg by mouth daily. (Patient not taking: Reported on 08/18/2017), Disp: 30 tablet, Rfl: 3 .  Dulaglutide (TRULICITY) 3.01 SW/1.0XN SOPN, Inject 0.75 mg into the skin once a week., Disp: 4 pen, Rfl: 5 .  sitaGLIPtin (JANUVIA) 100 MG tablet, Take 1 tablet (100 mg total) by mouth daily. (Patient not taking: Reported on 08/18/2017), Disp: 30 tablet, Rfl: 5  Review of Systems  Constitutional: Negative.   HENT: Negative.   Respiratory: Negative.   Cardiovascular: Negative.   Gastrointestinal: Negative.   Endocrine: Negative.   Musculoskeletal: Negative.   Neurological: Negative.     Social History  Substance Use Topics  . Smoking status: Current Every Day Smoker    Packs/day: 1.00    Years: 30.00    Types: Cigarettes  . Smokeless tobacco: Never Used  .  Alcohol use No   Objective:   BP 100/60 (BP Location: Left Arm, Patient Position: Sitting, Cuff Size: Large)   Pulse 87   Temp 99 F (37.2 C) (Oral)   Resp 16   Ht 5\' 3"  (1.6 m)   Wt 163 lb 9.6 oz (74.2 kg)   SpO2 99%   BMI 28.98 kg/m  Vitals:   08/18/17 0830  BP: 100/60  Pulse: 87  Resp: 16  Temp: 99 F (37.2 C)  TempSrc: Oral  SpO2: 99%  Weight: 163 lb 9.6 oz (74.2 kg)  Height: 5\' 3"  (1.6 m)     Physical Exam  Constitutional: She appears well-developed and well-nourished. No distress.  Neck: Normal range of motion. Neck supple.  Cardiovascular: Normal rate, regular rhythm and  normal heart sounds.  Exam reveals no gallop and no friction rub.   No murmur heard. Pulmonary/Chest: Effort normal and breath sounds normal. No respiratory distress. She has no wheezes. She has no rales.  Skin: She is not diaphoretic.  Vitals reviewed.      Assessment & Plan:     1. Type 2 diabetes mellitus without complication, without long-term current use of insulin (HCC) A1c uncontrolled to 12.7. Patient has not been taking [place] or Januvia. Trulicity given today in the office and patient educated on self injections. Rx sent to [facility]. I will see her back in 1 month to recheck.  - POCT glycosylated hemoglobin (Hb A1C) - Dulaglutide (TRULICITY) 3.55 HR/4.1UL SOPN; Inject 0.75 mg into the skin once a week.  Dispense: 4 pen; Refill: 5 - Alcohol Swabs (ALCOHOL PREP) PADS; For cleaning skin once weekly before injection  Dispense: 100 each; Refill: [place], PA-C  [facility]

## 2017-08-18 NOTE — Patient Instructions (Signed)
Dulaglutide injection What is this medicine? DULAGLUTIDE (DOO la GLOO tide) is used to improve blood sugar control in adults with type 2 diabetes. This medicine may be used with other oral diabetes medicines. This medicine may be used for other purposes; ask your health care provider or pharmacist if you have questions. COMMON BRAND NAME(S): TRULICITY What should I tell my health care provider before I take this medicine? They need to know if you have any of these conditions: -endocrine tumors (MEN 2) or if someone in your family had these tumors -history of pancreatitis -kidney disease -liver disease -stomach problems -thyroid cancer or if someone in your family had thyroid cancer -an unusual or allergic reaction to dulaglutide, other medicines, foods, dyes, or preservatives -pregnant or trying to get pregnant -breast-feeding How should I use this medicine? This medicine is for injection under the skin of your upper leg (thigh), stomach area, or upper arm. It is usually given once every week (every 7 days). You will be taught how to prepare and give this medicine. Use exactly as directed. Take your medicine at regular intervals. Do not take it more often than directed. If you use this medicine with insulin, you should inject this medicine and the insulin separately. Do not mix them together. Do not give the injections right next to each other. Change (rotate) injection sites with each injection. It is important that you put your used needles and syringes in a special sharps container. Do not put them in a trash can. If you do not have a sharps container, call your pharmacist or healthcare provider to get one. A special MedGuide will be given to you by the pharmacist with each prescription and refill. Be sure to read this information carefully each time. Talk to your pediatrician regarding the use of this medicine in children. Special care may be needed. Overdosage: If you think you have taken  too much of this medicine contact a poison control center or emergency room at once. NOTE: This medicine is only for you. Do not share this medicine with others. What if I miss a dose? If you miss a dose, take it as soon as you can within 3 days after the missed dose. Then take your next dose at your regular weekly time. If it has been longer than 3 days after the missed dose, do not take the missed dose. Take the next dose at your regular time. Do not take double or extra doses. If you have questions about a missed dose, contact your health care provider for advice. What may interact with this medicine? -other medicines for diabetes Many medications may cause changes in blood sugar, these include: -alcohol containing beverages -antiviral medicines for HIV or AIDS -aspirin and aspirin-like drugs -certain medicines for blood pressure, heart disease, irregular heart beat -chromium -diuretics -female hormones, such as estrogens or progestins, birth control pills -fenofibrate -gemfibrozil -isoniazid -lanreotide -female hormones or anabolic steroids -MAOIs like Carbex, Eldepryl, Marplan, Nardil, and Parnate -medicines for weight loss -medicines for allergies, asthma, cold, or cough -medicines for depression, anxiety, or psychotic disturbances -niacin -nicotine -NSAIDs, medicines for pain and inflammation, like ibuprofen or naproxen -octreotide -pasireotide -pentamidine -phenytoin -probenecid -quinolone antibiotics such as ciprofloxacin, levofloxacin, ofloxacin -some herbal dietary supplements -steroid medicines such as prednisone or cortisone -sulfamethoxazole; trimethoprim -thyroid hormones Some medications can hide the warning symptoms of low blood sugar (hypoglycemia). You may need to monitor your blood sugar more closely if you are taking one of these medications. These include: -beta-blockers,  often used for high blood pressure or heart problems (examples include atenolol,  metoprolol, propranolol) -clonidine -guanethidine -reserpine This list may not describe all possible interactions. Give your health care provider a list of all the medicines, herbs, non-prescription drugs, or dietary supplements you use. Also tell them if you smoke, drink alcohol, or use illegal drugs. Some items may interact with your medicine. What should I watch for while using this medicine? Visit your doctor or health care professional for regular checks on your progress. Drink plenty of fluids while taking this medicine. Check with your doctor or health care professional if you get an attack of severe diarrhea, nausea, and vomiting. The loss of too much body fluid can make it dangerous for you to take this medicine. A test called the HbA1C (A1C) will be monitored. This is a simple blood test. It measures your blood sugar control over the last 2 to 3 months. You will receive this test every 3 to 6 months. Learn how to check your blood sugar. Learn the symptoms of low and high blood sugar and how to manage them. Always carry a quick-source of sugar with you in case you have symptoms of low blood sugar. Examples include hard sugar candy or glucose tablets. Make sure others know that you can choke if you eat or drink when you develop serious symptoms of low blood sugar, such as seizures or unconsciousness. They must get medical help at once. Tell your doctor or health care professional if you have high blood sugar. You might need to change the dose of your medicine. If you are sick or exercising more than usual, you might need to change the dose of your medicine. Do not skip meals. Ask your doctor or health care professional if you should avoid alcohol. Many nonprescription cough and cold products contain sugar or alcohol. These can affect blood sugar. Pens should never be shared. Even if the needle is changed, sharing may result in passing of viruses like hepatitis or HIV. Wear a medical ID bracelet  or chain, and carry a card that describes your disease and details of your medicine and dosage times. What side effects may I notice from receiving this medicine? Side effects that you should report to your doctor or health care professional as soon as possible: -allergic reactions like skin rash, itching or hives, swelling of the face, lips, or tongue -breathing problems -diarrhea that continues or is severe -lump or swelling on the neck -severe nausea -signs and symptoms of infection like fever or chills; cough; sore throat; pain or trouble passing urine -signs and symptoms of low blood sugar such as feeling anxious, confusion, dizziness, increased hunger, unusually weak or tired, sweating, shakiness, cold, irritable, headache, blurred vision, fast heartbeat, loss of consciousness -signs and symptoms of kidney injury like trouble passing urine or change in the amount of urine -trouble swallowing -unusual stomach upset or pain -vomiting Side effects that usually do not require medical attention (report to your doctor or health care professional if they continue or are bothersome): -diarrhea -loss of appetite -nausea -pain, redness, or irritation at site where injected -stomach upset This list may not describe all possible side effects. Call your doctor for medical advice about side effects. You may report side effects to FDA at 1-800-FDA-1088. Where should I keep my medicine? Keep out of the reach of children. Store unopened pens in a refrigerator between 2 and 8 degrees C (36 and 46 degrees F). Do not freeze or use if the  medicine has been frozen. Protect from light and excessive heat. Store in the carton until use. Each single-dose pen can be kept at room temperature, not to exceed 30 degrees C (86 degrees F) for a total of 14 days, if needed. Throw away any unused medicine after the expiration date on the label. NOTE: This sheet is a summary. It may not cover all possible information. If  you have questions about this medicine, talk to your doctor, pharmacist, or health care provider.  2018 Elsevier/Gold Standard (2016-10-23 14:35:01)  

## 2017-08-21 DIAGNOSIS — L408 Other psoriasis: Secondary | ICD-10-CM | POA: Diagnosis not present

## 2017-08-21 DIAGNOSIS — Z79899 Other long term (current) drug therapy: Secondary | ICD-10-CM | POA: Diagnosis not present

## 2017-08-21 DIAGNOSIS — B078 Other viral warts: Secondary | ICD-10-CM | POA: Diagnosis not present

## 2017-08-21 DIAGNOSIS — L820 Inflamed seborrheic keratosis: Secondary | ICD-10-CM | POA: Diagnosis not present

## 2017-08-24 DIAGNOSIS — Z79899 Other long term (current) drug therapy: Secondary | ICD-10-CM | POA: Diagnosis not present

## 2017-08-24 DIAGNOSIS — L409 Psoriasis, unspecified: Secondary | ICD-10-CM | POA: Diagnosis not present

## 2017-09-18 ENCOUNTER — Encounter: Payer: Self-pay | Admitting: Physician Assistant

## 2017-09-18 ENCOUNTER — Ambulatory Visit: Payer: Federal, State, Local not specified - PPO | Admitting: Physician Assistant

## 2017-09-18 VITALS — BP 120/60 | HR 86 | Temp 99.2°F | Resp 16 | Wt 166.2 lb

## 2017-09-18 DIAGNOSIS — H6121 Impacted cerumen, right ear: Secondary | ICD-10-CM

## 2017-09-18 DIAGNOSIS — E119 Type 2 diabetes mellitus without complications: Secondary | ICD-10-CM

## 2017-09-18 DIAGNOSIS — H00012 Hordeolum externum right lower eyelid: Secondary | ICD-10-CM

## 2017-09-18 DIAGNOSIS — L403 Pustulosis palmaris et plantaris: Secondary | ICD-10-CM | POA: Diagnosis not present

## 2017-09-18 DIAGNOSIS — Z79899 Other long term (current) drug therapy: Secondary | ICD-10-CM | POA: Diagnosis not present

## 2017-09-18 LAB — POCT GLYCOSYLATED HEMOGLOBIN (HGB A1C)
Est. average glucose Bld gHb Est-mCnc: 255
HEMOGLOBIN A1C: 10.5

## 2017-09-18 NOTE — Patient Instructions (Signed)
Earwax Buildup, Adult The ears produce a substance called earwax that helps keep bacteria out of the ear and protects the skin in the ear canal. Occasionally, earwax can build up in the ear and cause discomfort or hearing loss. What increases the risk? This condition is more likely to develop in people who:  Are female.  Are elderly.  Naturally produce more earwax.  Clean their ears often with cotton swabs.  Use earplugs often.  Use in-ear headphones often.  Wear hearing aids.  Have narrow ear canals.  Have earwax that is overly thick or sticky.  Have eczema.  Are dehydrated.  Have excess hair in the ear canal.  What are the signs or symptoms? Symptoms of this condition include:  Reduced or muffled hearing.  A feeling of fullness in the ear or feeling that the ear is plugged.  Fluid coming from the ear.  Ear pain.  Ear itch.  Ringing in the ear.  Coughing.  An obvious piece of earwax that can be seen inside the ear canal.  How is this diagnosed? This condition may be diagnosed based on:  Your symptoms.  Your medical history.  An ear exam. During the exam, your health care provider will look into your ear with an instrument called an otoscope.  You may have tests, including a hearing test. How is this treated? This condition may be treated by:  Using ear drops to soften the earwax.  Having the earwax removed by a health care provider. The health care provider may: ? Flush the ear with water. ? Use an instrument that has a loop on the end (curette). ? Use a suction device.  Surgery to remove the wax buildup. This may be done in severe cases.  Follow these instructions at home:  Take over-the-counter and prescription medicines only as told by your health care provider.  Do not put any objects, including cotton swabs, into your ear. You can clean the opening of your ear canal with a washcloth or facial tissue.  Follow instructions from your health  care provider about cleaning your ears. Do not over-clean your ears.  Drink enough fluid to keep your urine clear or pale yellow. This will help to thin the earwax.  Keep all follow-up visits as told by your health care provider. If earwax builds up in your ears often or if you use hearing aids, consider seeing your health care provider for routine, preventive ear cleanings. Ask your health care provider how often you should schedule your cleanings.  If you have hearing aids, clean them according to instructions from the manufacturer and your health care provider. Contact a health care provider if:  You have ear pain.  You develop a fever.  You have blood, pus, or other fluid coming from your ear.  You have hearing loss.  You have ringing in your ears that does not go away.  Your symptoms do not improve with treatment.  You feel like the room is spinning (vertigo). Summary  Earwax can build up in the ear and cause discomfort or hearing loss.  The most common symptoms of this condition include reduced or muffled hearing and a feeling of fullness in the ear or feeling that the ear is plugged.  This condition may be diagnosed based on your symptoms, your medical history, and an ear exam.  This condition may be treated by using ear drops to soften the earwax or by having the earwax removed by a health care provider.  Do   not put any objects, including cotton swabs, into your ear. You can clean the opening of your ear canal with a washcloth or facial tissue. This information is not intended to replace advice given to you by your health care provider. Make sure you discuss any questions you have with your health care provider. Document Released: 11/13/2004 Document Revised: 12/17/2016 Document Reviewed: 12/17/2016 Elsevier Interactive Patient Education  2018 Elsevier Inc.  

## 2017-09-18 NOTE — Progress Notes (Signed)
Patient: Jacqueline Sparks Female    DOB: 1961/05/10   56 y.o.   MRN: [id number] Visit Date: 09/18/2017  Today's Provider: Mar Daring, PA-C   Chief Complaint  Patient presents with  . Follow-up    Diabetes   Subjective:    HPI  Diabetes Mellitus Type II, Follow-up:   Lab Results  Component Value Date   HGBA1C 10.5 09/18/2017   HGBA1C 12.7 08/18/2017   HGBA1C 8.8 05/11/2017    Last seen for diabetes 1 months ago.  Management since then includes start Trulicity 0.75mg  once a week.She reports that she is taking Januvia but not [place]. She reports excellent compliance with treatment. She is not having side effects. She reports she does have constipation but is not sure if it is coming from the Trulicity Current symptoms include none and have been stable. Home blood sugar records: not checking  Episodes of hypoglycemia? unknown   Current Insulin Regimen: Trulicity Most Recent Eye Exam: 3 weeks ago Weight trend: stable Prior visit with dietician: no Current diet: in general, a "healthy" diet   Current exercise: aerobics and housecleaning  Pertinent Labs:    Component Value Date/Time   CHOL 162 11/24/2016 0853   TRIG 405 (H) 11/24/2016 0853   HDL 29 (L) 11/24/2016 0853   LDLCALC Comment 11/24/2016 0853   CREATININE 0.86 04/10/2017 0957    Wt Readings from Last 3 Encounters:  09/18/17 166 lb 3.2 oz (75.4 kg)  08/18/17 163 lb 9.6 oz (74.2 kg)  06/01/17 165 lb 12.8 oz (75.2 kg)    She also has acute complaint today of right ear pain/fullness and right eye irritation.  ------------------------------------------------------------------------     No Known Allergies   Current Outpatient Medications:  .  Alcohol Swabs (ALCOHOL PREP) PADS, For cleaning skin once weekly before injection, Disp: 100 each, Rfl: 3 .  ALPRAZolam (XANAX) 0.5 MG tablet, TAKE ONE-HALF TO ONE TABLET BY MOUTH TWICE DAILY AS NEEDED, Disp: 60 tablet, Rfl: 5 .  atorvastatin  (LIPITOR) 20 MG tablet, Take 1 tablet (20 mg total) by mouth daily., Disp: 30 tablet, Rfl: 5 .  clobetasol ointment (TEMOVATE) 0.05 %, Apply topically 2 (two) times daily., Disp: 60 g, Rfl: 5 .  Dulaglutide (TRULICITY) 5.73 UK/0.2RK SOPN, Inject 0.75 mg into the skin once a week., Disp: 4 pen, Rfl: 5 .  glipiZIDE (GLUCOTROL) 10 MG tablet, TAKE ONE TABLET BY MOUTH TWICE DAILY 30 MINUTES BEFORE A MEAL, Disp: 60 tablet, Rfl: 5 .  NON FORMULARY, CPAP, Disp: , Rfl:  .  ONE TOUCH ULTRA TEST test strip, USE ONE STRIP TO CHECK GLUCOSE ONCE DAILY, Disp: 100 each, Rfl: 1 .  pioglitazone (ACTOS) 45 MG tablet, Take 1 tablet (45 mg total) by mouth daily., Disp: 30 tablet, Rfl: 5  Review of Systems  Constitutional: Negative for fatigue.  HENT: Positive for ear pain (right). Negative for congestion, ear discharge, hearing loss, postnasal drip, rhinorrhea, sinus pressure, sinus pain, sore throat and tinnitus.   Eyes: Positive for discharge and itching. Negative for photophobia, pain, redness and visual disturbance.  Respiratory: Negative for cough, chest tightness and shortness of breath.   Cardiovascular: Negative for chest pain, palpitations and leg swelling.  Endocrine: Negative for polydipsia, polyphagia and polyuria.  Neurological: Negative for dizziness, light-headedness and headaches.    Social History   Tobacco Use  . Smoking status: Current Every Day Smoker    Packs/day: 1.00    Years: 30.00    Pack years:  30.00    Types: Cigarettes  . Smokeless tobacco: Never Used  Substance Use Topics  . Alcohol use: No   Objective:   BP 120/60 (BP Location: Left Arm, Patient Position: Sitting, Cuff Size: Normal)   Pulse 86   Temp 99.2 F (37.3 C) (Oral)   Resp 16   Wt 166 lb 3.2 oz (75.4 kg)   BMI 29.44 kg/m    Physical Exam  Constitutional: She appears well-developed and well-nourished. No distress.  HENT:  Head: Normocephalic and atraumatic.  Right Ear: Hearing, tympanic membrane, external  ear and ear canal normal.  Left Ear: Hearing, tympanic membrane, external ear and ear canal normal.  Nose: Nose normal.  Mouth/Throat: Uvula is midline, oropharynx is clear and moist and mucous membranes are normal. No oropharyngeal exudate.  Cerumen impaction noted in R ear. Ear lavage performed successfully. R TM visualized and normal.  Eyes: Conjunctivae are normal. Pupils are equal, round, and reactive to light. Right eye exhibits hordeolum. Right eye exhibits no discharge. Left eye exhibits no discharge. No scleral icterus.    Neck: Normal range of motion. Neck supple. No tracheal deviation present. No thyromegaly present.  Cardiovascular: Normal rate, regular rhythm and normal heart sounds. Exam reveals no gallop and no friction rub.  No murmur heard. Pulmonary/Chest: Effort normal and breath sounds normal. No stridor. No respiratory distress. She has no wheezes. She has no rales.  Lymphadenopathy:    She has no cervical adenopathy.  Skin: Skin is warm and dry. She is not diaphoretic.  Vitals reviewed.       Assessment & Plan:     1. Type 2 diabetes mellitus without complication, without long-term current use of insulin (HCC) A1c improved to 10.5 with addition of trulicity. Continue glipizide, actos and [place]. I will see her back in 3 months.  - POCT glycosylated hemoglobin (Hb A1C) - sitaGLIPtin (JANUVIA) 100 MG tablet; Take 1 tablet (100 mg total) by mouth daily.  Dispense: 90 tablet; Refill: 1  2. Impacted cerumen of right ear Lavage successful.  - Ear Lavage  3. Hordeolum externum of right lower eyelid Advised to use warm compress. Call if no improvements.        Mar Daring, PA-C  [facility]

## 2017-10-16 DIAGNOSIS — H1032 Unspecified acute conjunctivitis, left eye: Secondary | ICD-10-CM | POA: Diagnosis not present

## 2017-10-19 DIAGNOSIS — H1032 Unspecified acute conjunctivitis, left eye: Secondary | ICD-10-CM | POA: Diagnosis not present

## 2017-10-19 DIAGNOSIS — H00024 Hordeolum internum left upper eyelid: Secondary | ICD-10-CM | POA: Diagnosis not present

## 2017-10-26 ENCOUNTER — Other Ambulatory Visit: Payer: Self-pay | Admitting: Physician Assistant

## 2017-10-26 DIAGNOSIS — E1165 Type 2 diabetes mellitus with hyperglycemia: Secondary | ICD-10-CM

## 2017-10-30 ENCOUNTER — Encounter: Payer: Self-pay | Admitting: Physician Assistant

## 2017-11-03 DIAGNOSIS — Z79899 Other long term (current) drug therapy: Secondary | ICD-10-CM | POA: Diagnosis not present

## 2017-11-16 DIAGNOSIS — L403 Pustulosis palmaris et plantaris: Secondary | ICD-10-CM | POA: Diagnosis not present

## 2017-12-21 ENCOUNTER — Ambulatory Visit: Payer: Self-pay | Admitting: Physician Assistant

## 2018-01-25 ENCOUNTER — Other Ambulatory Visit: Payer: Self-pay | Admitting: Physician Assistant

## 2018-01-25 DIAGNOSIS — E7800 Pure hypercholesterolemia, unspecified: Secondary | ICD-10-CM

## 2018-02-15 DIAGNOSIS — L403 Pustulosis palmaris et plantaris: Secondary | ICD-10-CM | POA: Diagnosis not present

## 2018-02-15 DIAGNOSIS — Z79899 Other long term (current) drug therapy: Secondary | ICD-10-CM | POA: Diagnosis not present

## 2018-02-16 DIAGNOSIS — Z79899 Other long term (current) drug therapy: Secondary | ICD-10-CM | POA: Diagnosis not present

## 2018-03-14 ENCOUNTER — Other Ambulatory Visit: Payer: Self-pay | Admitting: Physician Assistant

## 2018-03-14 DIAGNOSIS — E119 Type 2 diabetes mellitus without complications: Secondary | ICD-10-CM

## 2018-03-22 DIAGNOSIS — R0602 Shortness of breath: Secondary | ICD-10-CM | POA: Diagnosis not present

## 2018-03-22 DIAGNOSIS — E669 Obesity, unspecified: Secondary | ICD-10-CM | POA: Diagnosis not present

## 2018-03-22 DIAGNOSIS — R011 Cardiac murmur, unspecified: Secondary | ICD-10-CM | POA: Diagnosis not present

## 2018-03-22 DIAGNOSIS — I208 Other forms of angina pectoris: Secondary | ICD-10-CM | POA: Diagnosis not present

## 2018-04-02 DIAGNOSIS — R0602 Shortness of breath: Secondary | ICD-10-CM | POA: Diagnosis not present

## 2018-04-02 DIAGNOSIS — R011 Cardiac murmur, unspecified: Secondary | ICD-10-CM | POA: Diagnosis not present

## 2018-04-02 DIAGNOSIS — I208 Other forms of angina pectoris: Secondary | ICD-10-CM | POA: Diagnosis not present

## 2018-05-28 DIAGNOSIS — L255 Unspecified contact dermatitis due to plants, except food: Secondary | ICD-10-CM | POA: Diagnosis not present

## 2018-06-28 ENCOUNTER — Other Ambulatory Visit: Payer: Self-pay | Admitting: Physician Assistant

## 2018-06-28 DIAGNOSIS — E1165 Type 2 diabetes mellitus with hyperglycemia: Secondary | ICD-10-CM

## 2018-07-01 ENCOUNTER — Ambulatory Visit (INDEPENDENT_AMBULATORY_CARE_PROVIDER_SITE_OTHER): Admitting: Physician Assistant

## 2018-07-01 ENCOUNTER — Encounter (INDEPENDENT_AMBULATORY_CARE_PROVIDER_SITE_OTHER): Payer: Self-pay | Admitting: Orthopedic Surgery

## 2018-07-01 VITALS — Ht 63.0 in | Wt 166.2 lb

## 2018-07-01 DIAGNOSIS — M25511 Pain in right shoulder: Secondary | ICD-10-CM

## 2018-07-01 DIAGNOSIS — M7541 Impingement syndrome of right shoulder: Secondary | ICD-10-CM

## 2018-07-01 NOTE — Progress Notes (Signed)
Office Visit Note   Patient: Jacqueline Sparks           Date of Birth: [birth year]           MRN: [id number] Visit Date: 07/01/2018              Requested by: Mar Daring, PA-C [address] [place], [place] [address] PCP: Mar Daring, PA-C  Chief Complaint  Patient presents with  . Right Shoulder - Follow-up    Impingement of R shoulder      HPI: Patient is a 57 year old female who presents with complaints of right shoulder pain.  She reports that she is continued to have discomfort off and on but over the past month to couple of months she has had more pain in the right shoulder than previous.  She is unable to sleep on her right shoulder.  She had a steroid injection last year and this seemed to last a while and she would like to try this again.  She has been doing range of motion exercises and this is helping some as well as her Thera-Band exercises but this is not eradicated the pain and she feels an injection would really help.  She has been taking Motrin for the pain but this is not relieved her discomfort.  She is right-hand dominant.  Assessment & Plan: Visit Diagnoses:  1. Impingement syndrome of right shoulder     Plan: The right shoulder was injected with steroid injection under sterile techniques and the patient tolerated this well.  She is good to continue to do her exercises and can continue to take Motrin as needed for breakthrough discomfort.  She does have restrictions as previously noted and will continue on these her job with maximum lifting to 10 pounds, no climbing and no overhead work..  We will also keep her out of work for 7 to 10 days to facilitate healing following her steroid injection.Marland Kitchen  She will follow-up on an as-needed basis.  Follow-Up Instructions: Return if symptoms worsen or fail to improve.   Ortho Exam  Patient is alert, oriented, no adenopathy, well-dressed, normal affect, normal respiratory effort. Examination of the  right shoulder shows 10 to 20 degrees of decreased flexion and abduction.  External rotation 70 degrees internal rotation 45 degrees.  She is tender to palpation over the biceps tendon.  Neer and Hawkins impingement tests are positive on the right shoulder.  Imaging: No results found. No images are attached to the encounter.  Labs: Lab Results  Component Value Date   HGBA1C 10.5 09/18/2017   HGBA1C 12.7 08/18/2017   HGBA1C 8.8 05/11/2017   LABURIC 5.3 04/10/2017     Lab Results  Component Value Date   ALBUMIN 4.1 11/24/2016   ALBUMIN 4.4 05/26/2016   ALBUMIN 4.2 11/19/2015   LABURIC 5.3 04/10/2017    Body mass index is 29.44 kg/m.  Orders:  Orders Placed This Encounter  Procedures  . Large Joint Inj   No orders of the defined types were placed in this encounter.    Procedures: Large Joint Inj: R subacromial bursa on 07/02/2018 9:36 AM Indications: diagnostic evaluation and pain Details: 22 G 1.5 in needle, posterior approach  Arthrogram: No  Medications: 5 mL lidocaine 1 %; 40 mg methylPREDNISolone acetate 40 MG/ML Outcome: tolerated well, no immediate complications Procedure, treatment alternatives, risks and benefits explained, specific risks discussed. Consent was given by the patient. Immediately prior to procedure a time out was called to verify  the correct patient, procedure, equipment, support staff and site/side marked as required. Patient was prepped and draped in the usual sterile fashion.      Clinical Data: No additional findings.  ROS:  All other systems negative, except as noted in the HPI. Review of Systems  Objective: Vital Signs: Ht 5\' 3"  (1.6 m)   Wt 166 lb 3.2 oz (75.4 kg)   BMI 29.44 kg/m   Specialty Comments:  No specialty comments available.  PMFS History: Patient Active Problem List   Diagnosis Date Noted  . Psoriasis 06/01/2017  . Polyp of sigmoid colon   . Benign neoplasm of descending colon   . Impingement syndrome of  left shoulder 10/06/2016  . Impingement syndrome of right shoulder 10/06/2016  . BP (high blood pressure) 11/12/2015  . Abnormal abdominal MRI 11/09/2015  . Abnormal ECG 11/09/2015  . Anxiety 11/09/2015  . Adiposity 01/30/2014  . Smoking 01/30/2014  . Avitaminosis D 05/03/2010  . Obstructive apnea 06/09/2007  . Hypercholesteremia 06/09/2007  . Compulsive tobacco user syndrome 06/09/2007  . Renal tubular disorder 05/19/2007  . Diabetes mellitus, type 2 ([place]) 06/04/2006  . Apnea, sleep 06/04/2006   Past Medical History:  Diagnosis Date  . Anxiety   . Bronchitis   . Diabetes mellitus without complication ([place])   . GERD (gastroesophageal reflux disease)   . Hyperlipidemia   . Sleep apnea     Family History  Problem Relation Age of Onset  . Hyperlipidemia Mother   . Hypertension Mother   . Diabetes Mother   . Diabetes Father   . Hyperlipidemia Father   . Hypertension Father     Past Surgical History:  Procedure Laterality Date  . CARPAL TUNNEL RELEASE  2001   as staated this was the left side, right side was completed in 2003  . COLONOSCOPY WITH PROPOFOL N/A 02/17/2017   Procedure: COLONOSCOPY WITH PROPOFOL;  Surgeon: Lucilla Lame, MD;  Location: [facility] ENDOSCOPY;  Service: Endoscopy;  Laterality: N/A;  . DILATION AND CURETTAGE OF UTERUS  2008   as stated uterine polyps  . ROTATOR CUFF REPAIR Right 3004 and 2010  . TUBAL LIGATION  1999   Social History   Occupational History  . Not on file  Tobacco Use  . Smoking status: Current Every Day Smoker    Packs/day: 1.00    Years: 30.00    Pack years: 30.00    Types: Cigarettes  . Smokeless tobacco: Never Used  Substance and Sexual Activity  . Alcohol use: No  . Drug use: No  . Sexual activity: Never

## 2018-07-02 ENCOUNTER — Encounter (INDEPENDENT_AMBULATORY_CARE_PROVIDER_SITE_OTHER): Payer: Self-pay | Admitting: Physician Assistant

## 2018-07-02 DIAGNOSIS — M25511 Pain in right shoulder: Secondary | ICD-10-CM | POA: Diagnosis not present

## 2018-07-02 MED ORDER — METHYLPREDNISOLONE ACETATE 40 MG/ML IJ SUSP
40.0000 mg | INTRAMUSCULAR | Status: AC | PRN
  Administered 2018-07-02: 40 mg via INTRA_ARTICULAR

## 2018-07-02 MED ORDER — LIDOCAINE HCL 1 % IJ SOLN
5.0000 mL | INTRAMUSCULAR | Status: AC | PRN
  Administered 2018-07-02: 5 mL

## 2018-07-11 ENCOUNTER — Other Ambulatory Visit: Payer: Self-pay | Admitting: Physician Assistant

## 2018-07-26 ENCOUNTER — Other Ambulatory Visit: Payer: Self-pay | Admitting: Physician Assistant

## 2018-07-26 DIAGNOSIS — F419 Anxiety disorder, unspecified: Secondary | ICD-10-CM

## 2018-10-08 DIAGNOSIS — R0789 Other chest pain: Secondary | ICD-10-CM | POA: Diagnosis not present

## 2018-10-08 DIAGNOSIS — H6122 Impacted cerumen, left ear: Secondary | ICD-10-CM | POA: Diagnosis not present

## 2018-10-09 ENCOUNTER — Other Ambulatory Visit: Payer: Self-pay | Admitting: Physician Assistant

## 2018-10-09 DIAGNOSIS — E119 Type 2 diabetes mellitus without complications: Secondary | ICD-10-CM

## 2018-10-09 DIAGNOSIS — E1165 Type 2 diabetes mellitus with hyperglycemia: Secondary | ICD-10-CM

## 2018-10-28 DIAGNOSIS — J209 Acute bronchitis, unspecified: Secondary | ICD-10-CM | POA: Diagnosis not present

## 2018-10-28 DIAGNOSIS — J1189 Influenza due to unidentified influenza virus with other manifestations: Secondary | ICD-10-CM | POA: Diagnosis not present

## 2018-11-02 DIAGNOSIS — J069 Acute upper respiratory infection, unspecified: Secondary | ICD-10-CM | POA: Diagnosis not present

## 2018-12-08 ENCOUNTER — Ambulatory Visit: Payer: Federal, State, Local not specified - PPO | Admitting: Physician Assistant

## 2018-12-08 ENCOUNTER — Encounter: Payer: Self-pay | Admitting: Physician Assistant

## 2018-12-08 VITALS — BP 124/74 | HR 93 | Temp 99.8°F | Resp 16 | Wt 163.0 lb

## 2018-12-08 DIAGNOSIS — E119 Type 2 diabetes mellitus without complications: Secondary | ICD-10-CM

## 2018-12-08 DIAGNOSIS — J0100 Acute maxillary sinusitis, unspecified: Secondary | ICD-10-CM | POA: Diagnosis not present

## 2018-12-08 DIAGNOSIS — R05 Cough: Secondary | ICD-10-CM

## 2018-12-08 DIAGNOSIS — B379 Candidiasis, unspecified: Secondary | ICD-10-CM

## 2018-12-08 DIAGNOSIS — Z6828 Body mass index (BMI) 28.0-28.9, adult: Secondary | ICD-10-CM | POA: Diagnosis not present

## 2018-12-08 DIAGNOSIS — T3695XA Adverse effect of unspecified systemic antibiotic, initial encounter: Secondary | ICD-10-CM

## 2018-12-08 DIAGNOSIS — R059 Cough, unspecified: Secondary | ICD-10-CM

## 2018-12-08 LAB — POCT GLYCOSYLATED HEMOGLOBIN (HGB A1C)
ESTIMATED AVERAGE GLUCOSE: 171
Hemoglobin A1C: 7.6 % — AB (ref 4.0–5.6)

## 2018-12-08 MED ORDER — PROMETHAZINE-DM 6.25-15 MG/5ML PO SYRP: 5.0000 mL | ORAL_SOLUTION | Freq: Four times a day (QID) | ORAL | 0 refills | Status: DC | PRN

## 2018-12-08 MED ORDER — AMOXICILLIN-POT CLAVULANATE 875-125 MG PO TABS: 1.0000 | ORAL_TABLET | Freq: Two times a day (BID) | ORAL | 0 refills | Status: DC

## 2018-12-08 MED ORDER — FLUCONAZOLE 150 MG PO TABS: 150.0000 mg | ORAL_TABLET | Freq: Once | ORAL | 0 refills | Status: AC

## 2018-12-08 NOTE — Patient Instructions (Signed)
Sinusitis, Adult  Sinusitis is inflammation of your sinuses. Sinuses are hollow spaces in the bones around your face. Your sinuses are located:   Around your eyes.   In the middle of your forehead.   Behind your nose.   In your cheekbones.  Mucus normally drains out of your sinuses. When your nasal tissues become inflamed or swollen, mucus can become trapped or blocked. This allows bacteria, viruses, and fungi to grow, which leads to infection. Most infections of the sinuses are caused by a virus.  Sinusitis can develop quickly. It can last for up to 4 weeks (acute) or for more than 12 weeks (chronic). Sinusitis often develops after a cold.  What are the causes?  This condition is caused by anything that creates swelling in the sinuses or stops mucus from draining. This includes:   Allergies.   Asthma.   Infection from bacteria or viruses.   Deformities or blockages in your nose or sinuses.   Abnormal growths in the nose (nasal polyps).   Pollutants, such as chemicals or irritants in the air.   Infection from fungi (rare).  What increases the risk?  You are more likely to develop this condition if you:   Have a weak body defense system (immune system).   Do a lot of swimming or diving.   Overuse nasal sprays.   Smoke.  What are the signs or symptoms?  The main symptoms of this condition are pain and a feeling of pressure around the affected sinuses. Other symptoms include:   Stuffy nose or congestion.   Thick drainage from your nose.   Swelling and warmth over the affected sinuses.   Headache.   Upper toothache.   A cough that may get worse at night.   Extra mucus that collects in the throat or the back of the nose (postnasal drip).   Decreased sense of smell and taste.   Fatigue.   A fever.   Sore throat.   Bad breath.  How is this diagnosed?  This condition is diagnosed based on:   Your symptoms.   Your medical history.   A physical exam.   Tests to find out if your condition is  acute or chronic. This may include:  ? Checking your nose for nasal polyps.  ? Viewing your sinuses using a device that has a light (endoscope).  ? Testing for allergies or bacteria.  ? Imaging tests, such as an MRI or CT scan.  In rare cases, a bone biopsy may be done to rule out more serious types of fungal sinus disease.  How is this treated?  Treatment for sinusitis depends on the cause and whether your condition is chronic or acute.   If caused by a virus, your symptoms should go away on their own within 10 days. You may be given medicines to relieve symptoms. They include:  ? Medicines that shrink swollen nasal passages (topical intranasal decongestants).  ? Medicines that treat allergies (antihistamines).  ? A spray that eases inflammation of the nostrils (topical intranasal corticosteroids).  ? Rinses that help get rid of thick mucus in your nose (nasal saline washes).   If caused by bacteria, your health care provider may recommend waiting to see if your symptoms improve. Most bacterial infections will get better without antibiotic medicine. You may be given antibiotics if you have:  ? A severe infection.  ? A weak immune system.   If caused by narrow nasal passages or nasal polyps, you may need   to have surgery.  Follow these instructions at home:  Medicines   Take, use, or apply over-the-counter and prescription medicines only as told by your health care provider. These may include nasal sprays.   If you were prescribed an antibiotic medicine, take it as told by your health care provider. Do not stop taking the antibiotic even if you start to feel better.  Hydrate and humidify     Drink enough fluid to keep your urine pale yellow. Staying hydrated will help to thin your mucus.   Use a cool mist humidifier to keep the humidity level in your home above 50%.   Inhale steam for 10-15 minutes, 3-4 times a day, or as told by your health care provider. You can do this in the bathroom while a hot shower is  running.   Limit your exposure to cool or dry air.  Rest   Rest as much as possible.   Sleep with your head raised (elevated).   Make sure you get enough sleep each night.  General instructions     Apply a warm, moist washcloth to your face 3-4 times a day or as told by your health care provider. This will help with discomfort.   Wash your hands often with soap and water to reduce your exposure to germs. If soap and water are not available, use hand sanitizer.   Do not smoke. Avoid being around people who are smoking (secondhand smoke).   Keep all follow-up visits as told by your health care provider. This is important.  Contact a health care provider if:   You have a fever.   Your symptoms get worse.   Your symptoms do not improve within 10 days.  Get help right away if:   You have a severe headache.   You have persistent vomiting.   You have severe pain or swelling around your face or eyes.   You have vision problems.   You develop confusion.   Your neck is stiff.   You have trouble breathing.  Summary   Sinusitis is soreness and inflammation of your sinuses. Sinuses are hollow spaces in the bones around your face.   This condition is caused by nasal tissues that become inflamed or swollen. The swelling traps or blocks the flow of mucus. This allows bacteria, viruses, and fungi to grow, which leads to infection.   If you were prescribed an antibiotic medicine, take it as told by your health care provider. Do not stop taking the antibiotic even if you start to feel better.   Keep all follow-up visits as told by your health care provider. This is important.  This information is not intended to replace advice given to you by your health care provider. Make sure you discuss any questions you have with your health care provider.  Document Released: 10/06/2005 Document Revised: 03/08/2018 Document Reviewed: 03/08/2018  Elsevier Interactive Patient Education  2019 [employer]

## 2018-12-08 NOTE — Progress Notes (Signed)
Patient: Jacqueline Sparks Female    DOB: 1961-06-17   58 y.o.   MRN: [id number] Visit Date: 12/08/2018  Today's Provider: Mar Daring, PA-C   Chief Complaint  Patient presents with  . URI   Subjective:     URI   This is a new problem. The current episode started in the past 7 days (Started on Monday with her symptoms but feels really bad today.). The problem has been gradually worsening. Associated symptoms include congestion, coughing, headaches, rhinorrhea, sinus pain, a sore throat and wheezing. Pertinent negatives include no chest pain, diarrhea, ear pain, plugged ear sensation or vomiting. Associated symptoms comments: When she bend down her head hurts more Body ache all over. She has tried nothing for the symptoms.   T2DM: Patient also wants her A1C check today. She reports having polyuria, polydipsia and decreased appetite.  Diabetes Mellitus Type II, Follow-up:   Lab Results  Component Value Date   HGBA1C 7.6 (A) 12/08/2018   HGBA1C 10.5 09/18/2017   HGBA1C 12.7 08/18/2017    Last seen for diabetes more than a year ago.  Management since then includes none. Current symptoms include polydipsia, polyuria and weight loss and have been stable. Home blood sugar records: not checking   Current Insulin Regimen: Trulicity 7.49 mg Most Recent Eye Exam: is due  Pertinent Labs:    Component Value Date/Time   CHOL 162 11/24/2016 0853   TRIG 405 (H) 11/24/2016 0853   HDL 29 (L) 11/24/2016 0853   LDLCALC Comment 11/24/2016 0853   CREATININE 0.86 04/10/2017 0957    Wt Readings from Last 3 Encounters:  12/08/18 163 lb (73.9 kg)  07/01/18 166 lb 3.2 oz (75.4 kg)  09/18/17 166 lb 3.2 oz (75.4 kg)   -----------------------------------------------------------------------   No Known Allergies   Current Outpatient Medications:  .  ALPRAZolam (XANAX) 0.5 MG tablet, TAKE 1/2 TO 1 (ONE-HALF TO ONE) TABLET BY MOUTH TWICE DAILY AS NEEDED, Disp: 60 tablet, Rfl: 5 .   atorvastatin (LIPITOR) 20 MG tablet, TAKE 1 TABLET BY MOUTH ONCE DAILY, Disp: 90 tablet, Rfl: 1 .  clobetasol ointment (TEMOVATE) 0.05 %, Apply topically 2 (two) times daily., Disp: 60 g, Rfl: 5 .  glipiZIDE (GLUCOTROL) 10 MG tablet, TAKE 1 TABLET BY MOUTH TWICE DAILY 30 MINUTES BEFORE A MEAL, Disp: 180 tablet, Rfl: 1 .  JANUVIA 100 MG tablet, TAKE 1 TABLET BY MOUTH ONCE DAILY, Disp: 90 tablet, Rfl: 1 .  NON FORMULARY, CPAP, Disp: , Rfl:  .  pioglitazone (ACTOS) 45 MG tablet, TAKE 1 TABLET BY MOUTH ONCE DAILY, Disp: 90 tablet, Rfl: 1 .  TRULICITY 4.49 QP/5.9FM SOPN, INJECT 1 SYRINGEFUL (0.75 MG) SUBCUTANEOUSLY ONCE A WEEK, Disp: 4 mL, Rfl: 5 .  ONE TOUCH ULTRA TEST test strip, USE ONE STRIP TO CHECK GLUCOSE ONCE DAILY (Patient not taking: Reported on 12/08/2018), Disp: 100 each, Rfl: 1  Review of Systems  Constitutional: Positive for chills.  HENT: Positive for congestion, postnasal drip, rhinorrhea, sinus pressure, sinus pain, sore throat and trouble swallowing. Negative for ear pain.   Respiratory: Positive for cough, chest tightness and wheezing. Negative for shortness of breath.   Cardiovascular: Negative for chest pain, palpitations and leg swelling.  Gastrointestinal: Negative for diarrhea and vomiting.  Neurological: Positive for headaches.    Social History   Tobacco Use  . Smoking status: Current Every Day Smoker    Packs/day: 1.00    Years: 30.00    Pack years: 30.00  Types: Cigarettes  . Smokeless tobacco: Never Used  Substance Use Topics  . Alcohol use: No      Objective:   BP 124/74 (BP Location: Left Arm, Patient Position: Sitting, Cuff Size: Large)   Pulse 93   Temp 99.8 F (37.7 C) (Oral)   Resp 16   Wt 163 lb (73.9 kg)   BMI 28.87 kg/m  Vitals:   12/08/18 1705  BP: 124/74  Pulse: 93  Resp: 16  Temp: 99.8 F (37.7 C)  TempSrc: Oral  Weight: 163 lb (73.9 kg)     Physical Exam Vitals signs reviewed.  Constitutional:      General: She is not in  acute distress.    Appearance: Normal appearance. She is well-developed. She is ill-appearing. She is not diaphoretic.  HENT:     Head: Normocephalic and atraumatic.     Right Ear: Hearing, tympanic membrane, ear canal and external ear normal.     Left Ear: Hearing, tympanic membrane, ear canal and external ear normal.     Nose: Congestion and rhinorrhea present.     Right Sinus: Maxillary sinus tenderness and frontal sinus tenderness present.     Left Sinus: Maxillary sinus tenderness and frontal sinus tenderness present.     Mouth/Throat:     Mouth: Mucous membranes are moist.     Pharynx: Uvula midline. No oropharyngeal exudate.  Eyes:     Pupils: Pupils are equal, round, and reactive to light.  Neck:     Musculoskeletal: Normal range of motion and neck supple.     Thyroid: No thyromegaly.     Trachea: No tracheal deviation.  Cardiovascular:     Rate and Rhythm: Normal rate and regular rhythm.     Heart sounds: Normal heart sounds. No murmur. No friction rub. No gallop.   Pulmonary:     Effort: Pulmonary effort is normal. No respiratory distress.     Breath sounds: Normal breath sounds. No stridor. No wheezing or rales.  Lymphadenopathy:     Cervical: No cervical adenopathy.  Neurological:     Mental Status: She is alert.        Assessment & Plan    1. Type 2 diabetes mellitus without complication, without long-term current use of insulin (HCC) A1c doing well. Down to 7.6 from 10.5. Continue trulicity 0.75mg , januvia 100mg , glipizide 10mg  bid, and pioglitazone 45mg . I will see her back in May for her CPE.  - POCT glycosylated hemoglobin (Hb A1C)  2. BMI 28.0-28.9,adult Counseled patient on healthy lifestyle modifications including dieting and exercise. She has lost 3 pounds.   3. Acute non-recurrent maxillary sinusitis Worsening symptoms that have not responded to OTC medications. Will give augmentin as below. Continue allergy medications. Stay well hydrated and get  plenty of rest. Call if no symptom improvement or if symptoms worsen. - amoxicillin-clavulanate (AUGMENTIN) 875-125 MG tablet; Take 1 tablet by mouth 2 (two) times daily.  Dispense: 20 tablet; Refill: 0  4. Cough Promethazine DM prescribed as below. Push fluids. Discussed smoking cessation. Patient does not desire to quit.  - promethazine-dextromethorphan (PROMETHAZINE-DM) 6.25-15 MG/5ML syrup; Take 5 mLs by mouth 4 (four) times daily as needed for cough.  Dispense: 140 mL; Refill: 0  5. Antibiotic-induced yeast infection Gets yeast infections with antibiotics. - fluconazole (DIFLUCAN) 150 MG tablet; Take 1 tablet (150 mg total) by mouth once for 1 dose. May repeat in 48-72 hrs if needed  Dispense: 2 tablet; Refill: 0     Mar Daring, PA-C  [facility]

## 2019-01-31 ENCOUNTER — Encounter: Payer: Self-pay | Admitting: Physician Assistant

## 2019-01-31 ENCOUNTER — Telehealth: Payer: Self-pay

## 2019-01-31 ENCOUNTER — Other Ambulatory Visit: Payer: Self-pay

## 2019-01-31 ENCOUNTER — Ambulatory Visit: Payer: Federal, State, Local not specified - PPO | Admitting: Physician Assistant

## 2019-01-31 VITALS — BP 129/78 | HR 78 | Temp 98.8°F | Resp 16 | Wt 162.0 lb

## 2019-01-31 DIAGNOSIS — B379 Candidiasis, unspecified: Secondary | ICD-10-CM | POA: Diagnosis not present

## 2019-01-31 DIAGNOSIS — R05 Cough: Secondary | ICD-10-CM

## 2019-01-31 DIAGNOSIS — J029 Acute pharyngitis, unspecified: Secondary | ICD-10-CM

## 2019-01-31 DIAGNOSIS — R059 Cough, unspecified: Secondary | ICD-10-CM

## 2019-01-31 DIAGNOSIS — T3695XA Adverse effect of unspecified systemic antibiotic, initial encounter: Secondary | ICD-10-CM

## 2019-01-31 MED ORDER — FLUCONAZOLE 150 MG PO TABS: 150.0000 mg | ORAL_TABLET | Freq: Once | ORAL | 0 refills | Status: AC

## 2019-01-31 MED ORDER — AMOXICILLIN 875 MG PO TABS: 875.0000 mg | ORAL_TABLET | Freq: Two times a day (BID) | ORAL | 0 refills | Status: DC

## 2019-01-31 MED ORDER — BENZONATATE 200 MG PO CAPS: 200.0000 mg | ORAL_CAPSULE | Freq: Two times a day (BID) | ORAL | 0 refills | Status: DC | PRN

## 2019-01-31 NOTE — Patient Instructions (Signed)

## 2019-01-31 NOTE — Telephone Encounter (Signed)
Patient called and stated she would like a work note to be out from 02/01/2019-02/05/2019 due to her chronic diseases. I advised patient that the office is not writing patients out for work if not sick at this moment and then patient asked could she be seen for a sore throat today. Patient is stating that she believe she may have been exposure to the COVID-19 virus in January or February and I asked patient have she traveled or had any fever and she stated no. FYI

## 2019-01-31 NOTE — Progress Notes (Signed)
Patient: Jacqueline Sparks Female    DOB: Jul 02, 1961   58 y.o.   MRN: [id number] Visit Date: 01/31/2019  Today's Provider: Mar Daring, PA-C   Chief Complaint  Patient presents with  . Sore Throat   Subjective:     Sore Throat   This is a new problem. The current episode started 1 to 4 weeks ago (off and on for the past 3 weeks). The problem has been unchanged. There has been no fever. Associated symptoms include coughing and trouble swallowing. Pertinent negatives include no congestion, ear pain or shortness of breath. She has tried gargles for the symptoms.    No Known Allergies   Current Outpatient Medications:  .  ALPRAZolam (XANAX) 0.5 MG tablet, TAKE 1/2 TO 1 (ONE-HALF TO ONE) TABLET BY MOUTH TWICE DAILY AS NEEDED, Disp: 60 tablet, Rfl: 5 .  atorvastatin (LIPITOR) 20 MG tablet, TAKE 1 TABLET BY MOUTH ONCE DAILY, Disp: 90 tablet, Rfl: 1 .  clobetasol ointment (TEMOVATE) 0.05 %, Apply topically 2 (two) times daily., Disp: 60 g, Rfl: 5 .  glipiZIDE (GLUCOTROL) 10 MG tablet, TAKE 1 TABLET BY MOUTH TWICE DAILY 30 MINUTES BEFORE A MEAL, Disp: 180 tablet, Rfl: 1 .  JANUVIA 100 MG tablet, TAKE 1 TABLET BY MOUTH ONCE DAILY, Disp: 90 tablet, Rfl: 1 .  NON FORMULARY, CPAP, Disp: , Rfl:  .  pioglitazone (ACTOS) 45 MG tablet, TAKE 1 TABLET BY MOUTH ONCE DAILY, Disp: 90 tablet, Rfl: 1 .  TRULICITY 0.07 MA/2.6JF SOPN, INJECT 1 SYRINGEFUL (0.75 MG) SUBCUTANEOUSLY ONCE A WEEK, Disp: 4 mL, Rfl: 5 .  ONE TOUCH ULTRA TEST test strip, USE ONE STRIP TO CHECK GLUCOSE ONCE DAILY (Patient not taking: Reported on 12/08/2018), Disp: 100 each, Rfl: 1  Review of Systems  Constitutional: Positive for chills. Negative for fever.  HENT: Positive for sore throat and trouble swallowing. Negative for congestion, ear pain, postnasal drip, rhinorrhea, sinus pressure and sinus pain.   Respiratory: Positive for cough and wheezing (" a little"). Negative for chest tightness and shortness of breath.    Cardiovascular: Negative.   Neurological: Negative.     Social History   Tobacco Use  . Smoking status: Current Every Day Smoker    Packs/day: 1.00    Years: 30.00    Pack years: 30.00    Types: Cigarettes  . Smokeless tobacco: Never Used  Substance Use Topics  . Alcohol use: No      Objective:   BP 129/78 (BP Location: Left Arm, Patient Position: Sitting, Cuff Size: Large)   Pulse 78   Temp 98.8 F (37.1 C) (Oral)   Resp 16   Wt 162 lb (73.5 kg)   BMI 28.70 kg/m  Vitals:   01/31/19 0946  BP: 129/78  Pulse: 78  Resp: 16  Temp: 98.8 F (37.1 C)  TempSrc: Oral  Weight: 162 lb (73.5 kg)     Physical Exam Vitals signs reviewed.  Constitutional:      General: She is not in acute distress.    Appearance: She is well-developed. She is obese. She is not ill-appearing or diaphoretic.  HENT:     Head: Normocephalic and atraumatic.     Right Ear: Hearing, tympanic membrane, ear canal and external ear normal.     Left Ear: Hearing, tympanic membrane, ear canal and external ear normal.     Nose: Congestion present. No rhinorrhea.     Mouth/Throat:     Mouth: Mucous membranes are moist.  Pharynx: Uvula midline. Pharyngeal swelling and posterior oropharyngeal erythema present. No oropharyngeal exudate.     Tonsils: No tonsillar exudate or tonsillar abscesses.  Eyes:     General: No scleral icterus.       Right eye: No discharge.        Left eye: No discharge.     Conjunctiva/sclera: Conjunctivae normal.     Pupils: Pupils are equal, round, and reactive to light.  Neck:     Musculoskeletal: Normal range of motion and neck supple.     Thyroid: No thyromegaly.     Trachea: No tracheal deviation.  Cardiovascular:     Rate and Rhythm: Normal rate and regular rhythm.     Heart sounds: Normal heart sounds. No murmur. No friction rub. No gallop.   Pulmonary:     Effort: Pulmonary effort is normal. No respiratory distress.     Breath sounds: Normal breath sounds. No  stridor. No wheezing or rales.  Lymphadenopathy:     Cervical: No cervical adenopathy.  Skin:    General: Skin is warm and dry.  Neurological:     Mental Status: She is alert.         Assessment & Plan    1. Pharyngitis, unspecified etiology Worsening symptoms that have not responded to OTC medications. Will give Amoxil as below. Continue allergy medications. Stay well hydrated and get plenty of rest. Call if no symptom improvement or if symptoms worsen. - amoxicillin (AMOXIL) 875 MG tablet; Take 1 tablet (875 mg total) by mouth 2 (two) times daily.  Dispense: 20 tablet; Refill: 0  2. Antibiotic-induced yeast infection Gets yeast infections with antibiotics. Diflucan given as below.  - fluconazole (DIFLUCAN) 150 MG tablet; Take 1 tablet (150 mg total) by mouth once for 1 dose. May repeat in 72 hrs if needed  Dispense: 2 tablet; Refill: 0  3. Cough Tessalon for cough.  - benzonatate (TESSALON) 200 MG capsule; Take 1 capsule (200 mg total) by mouth 2 (two) times daily as needed for cough.  Dispense: 20 capsule; Refill: 0     Mar Daring, PA-C  [facility]

## 2019-03-02 NOTE — Progress Notes (Signed)
Patient: Jacqueline Sparks, Female    DOB: Feb 24, 1961, 58 y.o.   MRN: [id number] Visit Date: 03/03/2019  Today's Provider: Mar Daring, PA-C   Chief Complaint  Patient presents with  . Annual Exam   Subjective:     Annual physical exam Jacqueline Sparks is a 58 y.o. female who presents today for health maintenance and complete physical. She feels well. She reports exercising some, walks her dog. She reports she is sleeping fairly well. -----------------------------------------------------------------   Review of Systems  Constitutional: Negative.   HENT: Negative.   Eyes: Negative.   Respiratory: Negative.   Cardiovascular: Negative.   Gastrointestinal: Negative.   Endocrine: Negative.   Genitourinary: Negative.   Musculoskeletal: Negative.   Skin: Negative.   Allergic/Immunologic: Negative.   Neurological: Negative.   Hematological: Negative.   Psychiatric/Behavioral: Negative.     Social History      She  reports that she has been smoking cigarettes. She has a 30.00 pack-year smoking history. She has never used smokeless tobacco. She reports that she does not drink alcohol or use drugs.       Social History   Socioeconomic History  . Marital status: Single    Spouse name: Not on file  . Number of children: Not on file  . Years of education: Not on file  . Highest education level: Not on file  Occupational History  . Not on file  Social Needs  . Financial resource strain: Not on file  . Food insecurity:    Worry: Not on file    Inability: Not on file  . Transportation needs:    Medical: Not on file    Non-medical: Not on file  Tobacco Use  . Smoking status: Current Every Day Smoker    Packs/day: 1.00    Years: 30.00    Pack years: 30.00    Types: Cigarettes  . Smokeless tobacco: Never Used  Substance and Sexual Activity  . Alcohol use: No  . Drug use: No  . Sexual activity: Never  Lifestyle  . Physical activity:    Days per week: Not on file     Minutes per session: Not on file  . Stress: Not on file  Relationships  . Social connections:    Talks on phone: Not on file    Gets together: Not on file    Attends religious service: Not on file    Active member of club or organization: Not on file    Attends meetings of clubs or organizations: Not on file    Relationship status: Not on file  Other Topics Concern  . Not on file  Social History Narrative  . Not on file    Past Medical History:  Diagnosis Date  . Anxiety   . Bronchitis   . Diabetes mellitus without complication ([place])   . GERD (gastroesophageal reflux disease)   . Hyperlipidemia   . Sleep apnea      Patient Active Problem List   Diagnosis Date Noted  . Psoriasis 06/01/2017  . Polyp of sigmoid colon   . Benign neoplasm of descending colon   . Impingement syndrome of left shoulder 10/06/2016  . Impingement syndrome of right shoulder 10/06/2016  . BP (high blood pressure) 11/12/2015  . Abnormal abdominal MRI 11/09/2015  . Abnormal ECG 11/09/2015  . Anxiety 11/09/2015  . Adiposity 01/30/2014  . Smoking 01/30/2014  . Avitaminosis D 05/03/2010  . Obstructive apnea 06/09/2007  . Hypercholesteremia 06/09/2007  .  Compulsive tobacco user syndrome 06/09/2007  . Renal tubular disorder 05/19/2007  . Diabetes mellitus, type 2 ([place]) 06/04/2006  . Apnea, sleep 06/04/2006    Past Surgical History:  Procedure Laterality Date  . CARPAL TUNNEL RELEASE  2001   as staated this was the left side, right side was completed in 2003  . COLONOSCOPY WITH PROPOFOL N/A 02/17/2017   Procedure: COLONOSCOPY WITH PROPOFOL;  Surgeon: Lucilla Lame, MD;  Location: [facility] ENDOSCOPY;  Service: Endoscopy;  Laterality: N/A;  . DILATION AND CURETTAGE OF UTERUS  2008   as stated uterine polyps  . ROTATOR CUFF REPAIR Right 3004 and 2010  . TUBAL LIGATION  1999    Family History        Family Status  Relation Name Status  . Mother  Alive  . Father  Alive  . MGM  Deceased        died from a stroke  . MGF  Deceased       diaabetes Mellitus Type II  . PGM  Deceased       Colon Cancer  . PGF  Deceased       Died from old age  . Sister  Alive  . Sister  Alive  . Sister  Alive        Her family history includes Diabetes in her father and mother; Hyperlipidemia in her father and mother; Hypertension in her father and mother.      No Known Allergies   Current Outpatient Medications:  .  ALPRAZolam (XANAX) 0.5 MG tablet, TAKE 1/2 TO 1 (ONE-HALF TO ONE) TABLET BY MOUTH TWICE DAILY AS NEEDED, Disp: 60 tablet, Rfl: 5 .  atorvastatin (LIPITOR) 20 MG tablet, TAKE 1 TABLET BY MOUTH ONCE DAILY, Disp: 90 tablet, Rfl: 1 .  clobetasol ointment (TEMOVATE) 0.05 %, Apply topically 2 (two) times daily., Disp: 60 g, Rfl: 5 .  glipiZIDE (GLUCOTROL) 10 MG tablet, TAKE 1 TABLET BY MOUTH TWICE DAILY 30 MINUTES BEFORE A MEAL, Disp: 180 tablet, Rfl: 1 .  JANUVIA 100 MG tablet, TAKE 1 TABLET BY MOUTH ONCE DAILY, Disp: 90 tablet, Rfl: 1 .  NON FORMULARY, CPAP, Disp: , Rfl:  .  ONE TOUCH ULTRA TEST test strip, USE ONE STRIP TO CHECK GLUCOSE ONCE DAILY, Disp: 100 each, Rfl: 1 .  pioglitazone (ACTOS) 45 MG tablet, TAKE 1 TABLET BY MOUTH ONCE DAILY, Disp: 90 tablet, Rfl: 1 .  TRULICITY 9.67 EL/3.8BO SOPN, INJECT 1 SYRINGEFUL (0.75 MG) SUBCUTANEOUSLY ONCE A WEEK, Disp: 4 mL, Rfl: 5   Patient Care Team: Mar Daring, PA-C as PCP - General (Family Medicine)    Objective:    Vitals: BP 135/82 (BP Location: Left Arm, Patient Position: Sitting, Cuff Size: Large)   Pulse 69   Temp 98.1 F (36.7 C) (Oral)   Resp 16   Wt 164 lb 9.6 oz (74.7 kg)   BMI 29.16 kg/m    Vitals:   03/03/19 0815  BP: 135/82  Pulse: 69  Resp: 16  Temp: 98.1 F (36.7 C)  TempSrc: Oral  Weight: 164 lb 9.6 oz (74.7 kg)     Physical Exam Vitals signs reviewed.  Constitutional:      General: She is not in acute distress.    Appearance: Normal appearance. She is well-developed and normal weight. She  is not ill-appearing or diaphoretic.  HENT:     Head: Normocephalic and atraumatic.     Right Ear: Hearing, tympanic membrane, ear canal and external ear normal.  Left Ear: Hearing, tympanic membrane, ear canal and external ear normal.     Nose: Nose normal.     Mouth/Throat:     Mouth: Mucous membranes are moist.     Pharynx: Oropharynx is clear. Uvula midline. No oropharyngeal exudate.  Eyes:     General: No scleral icterus.       Right eye: No discharge.        Left eye: No discharge.     Extraocular Movements: Extraocular movements intact.     Conjunctiva/sclera: Conjunctivae normal.     Pupils: Pupils are equal, round, and reactive to light.  Neck:     Musculoskeletal: Normal range of motion and neck supple.     Thyroid: No thyromegaly.     Vascular: No carotid bruit or JVD.     Trachea: No tracheal deviation.  Cardiovascular:     Rate and Rhythm: Normal rate and regular rhythm.     Pulses: Normal pulses.     Heart sounds: Normal heart sounds. No murmur. No friction rub. No gallop.   Pulmonary:     Effort: Pulmonary effort is normal. No respiratory distress.     Breath sounds: Normal breath sounds. No wheezing or rales.  Chest:     Chest wall: No tenderness.     Breasts: Breasts are symmetrical.        Right: No inverted nipple, mass, nipple discharge, skin change or tenderness.        Left: No inverted nipple, mass, nipple discharge, skin change or tenderness.  Abdominal:     General: Bowel sounds are normal. There is no distension.     Palpations: Abdomen is soft. There is no mass.     Tenderness: There is no abdominal tenderness. There is no guarding or rebound.     Hernia: There is no hernia in the right inguinal area or left inguinal area.  Genitourinary:    Exam position: Supine.     Labia:        Right: No rash, tenderness, lesion or injury.        Left: No rash, tenderness, lesion or injury.      Vagina: Normal. No signs of injury. No vaginal discharge,  erythema, tenderness or bleeding.     Cervix: No cervical motion tenderness, discharge or friability.     Adnexa:        Right: No mass, tenderness or fullness.         Left: No mass, tenderness or fullness.       Rectum: Normal.  Musculoskeletal: Normal range of motion.        General: No tenderness.  Lymphadenopathy:     Cervical: No cervical adenopathy.  Skin:    General: Skin is warm and dry.     Capillary Refill: Capillary refill takes less than 2 seconds.     Findings: No rash.  Neurological:     General: No focal deficit present.     Mental Status: She is alert and oriented to person, place, and time. Mental status is at baseline.     Cranial Nerves: No cranial nerve deficit.     Coordination: Coordination normal.     Deep Tendon Reflexes: Reflexes are normal and symmetric.  Psychiatric:        Mood and Affect: Mood normal.        Behavior: Behavior normal.        Thought Content: Thought content normal.        Judgment: Judgment normal.  Diabetic Foot Exam - Simple   Simple Foot Form Diabetic Foot exam was performed with the following findings:  Yes 03/03/2019  8:58 AM  Visual Inspection No deformities, no ulcerations, no other skin breakdown bilaterally:  Yes Sensation Testing Intact to touch and monofilament testing bilaterally:  Yes Pulse Check Posterior Tibialis and Dorsalis pulse intact bilaterally:  Yes Comments     Depression Screen PHQ 2/9 Scores 03/03/2019 02/09/2017 02/09/2017  PHQ - 2 Score 0 0 0  PHQ- 9 Score - 2 -       Assessment & Plan:     Routine Health Maintenance and Physical Exam  Exercise Activities and Dietary recommendations Goals   None      There is no immunization history on file for this patient.  Health Maintenance  Topic Date Due  . Hepatitis C Screening  May 16, 1961  . PNEUMOCOCCAL POLYSACCHARIDE VACCINE AGE 51-64 HIGH RISK  03/01/1965  . OPHTHALMOLOGY EXAM  03/01/1973  . HIV Screening  03/01/1978  . TETANUS/TDAP   03/01/1982  . MAMMOGRAM  03/01/2013  . FOOT EXAM  02/09/2018  . URINE MICROALBUMIN  05/11/2018  . INFLUENZA VACCINE  05/21/2019  . HEMOGLOBIN A1C  06/08/2019  . PAP SMEAR-Modifier  02/10/2020  . COLONOSCOPY  02/17/2022     Discussed health benefits of physical activity, and encouraged her to engage in regular exercise appropriate for her age and condition.    1. Encounter for annual physical exam Normal physical exam today. Will check labs as below and f/u pending lab results. If labs are stable and WNL she will not need to have these rechecked for one year at her next annual physical exam. She is to call the office in the meantime if she has any acute issue, questions or concerns. - CBC with Differential/Platelet - Comprehensive metabolic panel - Hemoglobin A1c - Lipid panel - TSH  2. Cervical cancer screening Pap collected today. Will send as below and f/u pending results. - Cytology - PAP  3. History of abnormal cervical Pap smear Had HPV positive pap in 2018.  - Cytology - PAP  4. Encounter for screening mammogram for breast cancer Breast exam today was normal. There is no family history of breast cancer. She does perform regular self breast exams. Mammogram was ordered as below. Information for [facility] clinic was given to patient so she may schedule her mammogram at her convenience. - MM 3D SCREEN BREAST BILATERAL; Future  5. Type 2 diabetes mellitus without complication, without long-term current use of insulin (HCC) Stable. Continue glipizide 10mg  BID, Januvia 100mg  daily, pioglitazone 45mg  daily and trulicity 0.75mg  SQ weekly. Will check labs as below and f/u pending results. - CBC with Differential/Platelet - Comprehensive metabolic panel - Hemoglobin A1c - Lipid panel - POCT UA - Microalbumin  6. Hypercholesteremia Stable. Continue atorvastatin 20mg . Will check labs as below and f/u pending results. - CBC with Differential/Platelet - Comprehensive  metabolic panel - Hemoglobin A1c - Lipid panel  7. Encounter for hepatitis C screening test for low risk patient Will check labs as below and f/u pending results. - Hepatitis C antibody  8. Screening for HIV without presence of risk factors Will check labs as below and f/u pending results. - HIV Antibody (routine testing w rflx)  --------------------------------------------------------------------    Mar Daring, PA-C  [place]

## 2019-03-03 ENCOUNTER — Other Ambulatory Visit (HOSPITAL_COMMUNITY)
Admission: RE | Admit: 2019-03-03 | Discharge: 2019-03-03 | Disposition: A | Discharge status: Home / Self Care | Payer: Federal, State, Local not specified - PPO | Source: Ambulatory Visit | Attending: Physician Assistant | Admitting: Physician Assistant

## 2019-03-03 ENCOUNTER — Encounter: Payer: Self-pay | Admitting: Physician Assistant

## 2019-03-03 ENCOUNTER — Ambulatory Visit (INDEPENDENT_AMBULATORY_CARE_PROVIDER_SITE_OTHER): Payer: Federal, State, Local not specified - PPO | Admitting: Physician Assistant

## 2019-03-03 ENCOUNTER — Other Ambulatory Visit: Payer: Self-pay

## 2019-03-03 VITALS — BP 135/82 | HR 69 | Temp 98.1°F | Resp 16 | Wt 164.6 lb

## 2019-03-03 DIAGNOSIS — Z8742 Personal history of other diseases of the female genital tract: Secondary | ICD-10-CM | POA: Diagnosis not present

## 2019-03-03 DIAGNOSIS — Z1231 Encounter for screening mammogram for malignant neoplasm of breast: Secondary | ICD-10-CM | POA: Diagnosis not present

## 2019-03-03 DIAGNOSIS — Z124 Encounter for screening for malignant neoplasm of cervix: Secondary | ICD-10-CM | POA: Diagnosis not present

## 2019-03-03 DIAGNOSIS — Z1159 Encounter for screening for other viral diseases: Secondary | ICD-10-CM | POA: Diagnosis not present

## 2019-03-03 DIAGNOSIS — Z0000 Encounter for general adult medical examination without abnormal findings: Secondary | ICD-10-CM

## 2019-03-03 DIAGNOSIS — E119 Type 2 diabetes mellitus without complications: Secondary | ICD-10-CM | POA: Diagnosis not present

## 2019-03-03 DIAGNOSIS — Z114 Encounter for screening for human immunodeficiency virus [HIV]: Secondary | ICD-10-CM

## 2019-03-03 DIAGNOSIS — E7800 Pure hypercholesterolemia, unspecified: Secondary | ICD-10-CM

## 2019-03-03 LAB — POCT UA - MICROALBUMIN: Microalbumin Ur, POC: NEGATIVE mg/L

## 2019-03-03 NOTE — Patient Instructions (Signed)

## 2019-03-04 ENCOUNTER — Telehealth: Payer: Self-pay

## 2019-03-04 LAB — CBC WITH DIFFERENTIAL/PLATELET
Basophils Absolute: 0.1 10*3/uL (ref 0.0–0.2)
Basos: 1 %
EOS (ABSOLUTE): 0.2 10*3/uL (ref 0.0–0.4)
Eos: 3 %
Hematocrit: 37.4 % (ref 34.0–46.6)
Hemoglobin: 12.5 g/dL (ref 11.1–15.9)
Immature Grans (Abs): 0 10*3/uL (ref 0.0–0.1)
Immature Granulocytes: 0 %
Lymphocytes Absolute: 3.2 10*3/uL — ABNORMAL HIGH (ref 0.7–3.1)
Lymphs: 43 %
MCH: 27.1 pg (ref 26.6–33.0)
MCHC: 33.4 g/dL (ref 31.5–35.7)
MCV: 81 fL (ref 79–97)
Monocytes Absolute: 0.6 10*3/uL (ref 0.1–0.9)
Monocytes: 8 %
Neutrophils Absolute: 3.4 10*3/uL (ref 1.4–7.0)
Neutrophils: 45 %
Platelets: 305 10*3/uL (ref 150–450)
RBC: 4.62 x10E6/uL (ref 3.77–5.28)
RDW: 14.4 % (ref 11.7–15.4)
WBC: 7.5 10*3/uL (ref 3.4–10.8)

## 2019-03-04 LAB — HEMOGLOBIN A1C
Est. average glucose Bld gHb Est-mCnc: 160 mg/dL
Hgb A1c MFr Bld: 7.2 % — ABNORMAL HIGH (ref 4.8–5.6)

## 2019-03-04 LAB — COMPREHENSIVE METABOLIC PANEL
ALT: 10 IU/L (ref 0–32)
AST: 13 IU/L (ref 0–40)
Albumin/Globulin Ratio: 1.5 (ref 1.2–2.2)
Albumin: 4.2 g/dL (ref 3.8–4.9)
Alkaline Phosphatase: 74 IU/L (ref 39–117)
BUN/Creatinine Ratio: 7 — ABNORMAL LOW (ref 9–23)
BUN: 5 mg/dL — ABNORMAL LOW (ref 6–24)
Bilirubin Total: <0.2 mg/dL (ref 0.0–1.2)
CO2: 24 mmol/L (ref 20–29)
Calcium: 9.1 mg/dL (ref 8.7–10.2)
Chloride: 104 mmol/L (ref 96–106)
Creatinine, Ser: 0.72 mg/dL (ref 0.57–1.00)
GFR calc Af Amer: 108 mL/min/{1.73_m2} (ref >59)
GFR calc non Af Amer: 94 mL/min/{1.73_m2} (ref >59)
Globulin, Total: 2.8 g/dL (ref 1.5–4.5)
Glucose: 123 mg/dL — ABNORMAL HIGH (ref 65–99)
Potassium: 4.1 mmol/L (ref 3.5–5.2)
Sodium: 144 mmol/L (ref 134–144)
Total Protein: 7 g/dL (ref 6.0–8.5)

## 2019-03-04 LAB — CYTOLOGY - PAP
Diagnosis: NEGATIVE
HPV: NOT DETECTED

## 2019-03-04 LAB — LIPID PANEL
Chol/HDL Ratio: 5.3 ratio — ABNORMAL HIGH (ref 0.0–4.4)
Cholesterol, Total: 165 mg/dL (ref 100–199)
HDL: 31 mg/dL — ABNORMAL LOW (ref >39)
LDL Calculated: 88 mg/dL (ref 0–99)
Triglycerides: 232 mg/dL — ABNORMAL HIGH (ref 0–149)
VLDL Cholesterol Cal: 46 mg/dL — ABNORMAL HIGH (ref 5–40)

## 2019-03-04 LAB — HIV ANTIBODY (ROUTINE TESTING W REFLEX): HIV Screen 4th Generation wRfx: NONREACTIVE

## 2019-03-04 LAB — TSH: TSH: 0.995 u[IU]/mL (ref 0.450–4.500)

## 2019-03-04 LAB — HEPATITIS C ANTIBODY: Hep C Virus Ab: <0.1 s/co ratio (ref 0.0–0.9)

## 2019-03-04 NOTE — Telephone Encounter (Signed)
-----   Message from Mar Daring, [place] sent at 03/04/2019  9:17 AM EDT ----- Blood count is normal. Kidney and liver function normal. A1c down to 7.2 from 7.6! Keep up the good work and continue medications as prescribed. Cholesterol improving also. Thyroid normal. Hep C screen negative. HIV screen negative.

## 2019-03-04 NOTE — Telephone Encounter (Signed)
Lab results given to patient.

## 2019-03-15 ENCOUNTER — Telehealth: Payer: Self-pay

## 2019-03-15 NOTE — Telephone Encounter (Signed)
Left message to call back about lab results  

## 2019-03-15 NOTE — Telephone Encounter (Signed)
Pt advised.   Thanks,   -Tequisha Maahs  

## 2019-03-15 NOTE — Telephone Encounter (Signed)
-----   Message from Mar Daring, [place] sent at 03/15/2019  1:08 PM EDT ----- Pap is normal, HPV negative.  Will repeat in 5 years.

## 2019-04-18 ENCOUNTER — Ambulatory Visit (INDEPENDENT_AMBULATORY_CARE_PROVIDER_SITE_OTHER): Admitting: Orthopedic Surgery

## 2019-04-18 ENCOUNTER — Encounter: Payer: Self-pay | Admitting: Orthopedic Surgery

## 2019-04-18 ENCOUNTER — Other Ambulatory Visit: Payer: Self-pay

## 2019-04-18 VITALS — Ht 63.0 in | Wt 164.6 lb

## 2019-04-18 DIAGNOSIS — M7541 Impingement syndrome of right shoulder: Secondary | ICD-10-CM

## 2019-04-18 MED ORDER — METHYLPREDNISOLONE ACETATE 40 MG/ML IJ SUSP
40.0000 mg | INTRAMUSCULAR | Status: AC | PRN
  Administered 2019-04-18: 40 mg via INTRA_ARTICULAR

## 2019-04-18 MED ORDER — LIDOCAINE HCL 1 % IJ SOLN
5.0000 mL | INTRAMUSCULAR | Status: AC | PRN
  Administered 2019-04-18: 5 mL

## 2019-04-18 NOTE — Progress Notes (Signed)
Office Visit Note   Patient: Jacqueline Sparks           Date of Birth: 1961/05/16           MRN: [id number] Visit Date: 04/18/2019              Requested by: Mar Daring, PA-C [address] [place],  [place] [id number] PCP: Mar Daring, PA-C  Chief Complaint  Patient presents with  . Right Shoulder - Pain      HPI: Patient is a 58 year old woman who presents in follow-up for impingement symptoms right shoulder.  Patient has also had problems with her back and has had injections with Dr. Ernestina Patches.  Assessment & Plan: Visit Diagnoses:  1. Impingement syndrome of right shoulder     Plan: Patient's right shoulder was injected she was given a note to be out of work for 1 week.  She did not have her [place] 17 form and she will bring this in for [place] to complete the paperwork.  Follow-Up Instructions: Return if symptoms worsen or fail to improve.   Ortho Exam  Patient is alert, oriented, no adenopathy, well-dressed, normal affect, normal respiratory effort. Examination patient has pain with Neer and Hawkins impingement test pain with a drop arm test right shoulder.  She has no radicular symptoms.  Imaging: No results found. No images are attached to the encounter.  Labs: Lab Results  Component Value Date   HGBA1C 7.2 (H) 03/03/2019   HGBA1C 7.6 (A) 12/08/2018   HGBA1C 10.5 09/18/2017   LABURIC 5.3 04/10/2017     Lab Results  Component Value Date   ALBUMIN 4.2 03/03/2019   ALBUMIN 4.1 11/24/2016   ALBUMIN 4.4 05/26/2016   LABURIC 5.3 04/10/2017    Body mass index is 29.16 kg/m.  Orders:  No orders of the defined types were placed in this encounter.  No orders of the defined types were placed in this encounter.    Procedures: Large Joint Inj: R subacromial bursa on 04/18/2019 3:00 PM Indications: diagnostic evaluation and pain Details: 22 G 1.5 in needle, posterior approach  Arthrogram: No  Medications: 5 mL lidocaine 1 %; 40 mg  methylPREDNISolone acetate 40 MG/ML Outcome: tolerated well, no immediate complications Procedure, treatment alternatives, risks and benefits explained, specific risks discussed. Consent was given by the patient. Immediately prior to procedure a time out was called to verify the correct patient, procedure, equipment, support staff and site/side marked as required. Patient was prepped and draped in the usual sterile fashion.      Clinical Data: No additional findings.  ROS:  All other systems negative, except as noted in the HPI. Review of Systems  Objective: Vital Signs: Ht 5\' 3"  (1.6 m)   Wt 164 lb 9.6 oz (74.7 kg)   BMI 29.16 kg/m   Specialty Comments:  No specialty comments available.  PMFS History: Patient Active Problem List   Diagnosis Date Noted  . Psoriasis 06/01/2017  . Polyp of sigmoid colon   . Benign neoplasm of descending colon   . Impingement syndrome of left shoulder 10/06/2016  . Impingement syndrome of right shoulder 10/06/2016  . BP (high blood pressure) 11/12/2015  . Abnormal abdominal MRI 11/09/2015  . Abnormal ECG 11/09/2015  . Anxiety 11/09/2015  . Adiposity 01/30/2014  . Smoking 01/30/2014  . Avitaminosis D 05/03/2010  . Obstructive apnea 06/09/2007  . Hypercholesteremia 06/09/2007  . Compulsive tobacco user syndrome 06/09/2007  . Renal tubular disorder 05/19/2007  . Diabetes  mellitus, type 2 ([place]) 06/04/2006  . Apnea, sleep 06/04/2006   Past Medical History:  Diagnosis Date  . Anxiety   . Bronchitis   . Diabetes mellitus without complication ([place])   . GERD (gastroesophageal reflux disease)   . Hyperlipidemia   . Sleep apnea     Family History  Problem Relation Age of Onset  . Hyperlipidemia Mother   . Hypertension Mother   . Diabetes Mother   . Diabetes Father   . Hyperlipidemia Father   . Hypertension Father     Past Surgical History:  Procedure Laterality Date  . CARPAL TUNNEL RELEASE  2001   as staated this was the left  side, right side was completed in 2003  . COLONOSCOPY WITH PROPOFOL N/A 02/17/2017   Procedure: COLONOSCOPY WITH PROPOFOL;  Surgeon: Lucilla Lame, MD;  Location: [facility] ENDOSCOPY;  Service: Endoscopy;  Laterality: N/A;  . DILATION AND CURETTAGE OF UTERUS  2008   as stated uterine polyps  . ROTATOR CUFF REPAIR Right 3004 and 2010  . TUBAL LIGATION  1999   Social History   Occupational History  . Not on file  Tobacco Use  . Smoking status: Current Every Day Smoker    Packs/day: 1.00    Years: 30.00    Pack years: 30.00    Types: Cigarettes  . Smokeless tobacco: Never Used  Substance and Sexual Activity  . Alcohol use: No  . Drug use: No  . Sexual activity: Never

## 2019-04-21 DIAGNOSIS — F17210 Nicotine dependence, cigarettes, uncomplicated: Secondary | ICD-10-CM | POA: Diagnosis not present

## 2019-04-21 DIAGNOSIS — Z20828 Contact with and (suspected) exposure to other viral communicable diseases: Secondary | ICD-10-CM | POA: Diagnosis not present

## 2019-04-26 DIAGNOSIS — E782 Mixed hyperlipidemia: Secondary | ICD-10-CM | POA: Diagnosis not present

## 2019-04-26 DIAGNOSIS — E119 Type 2 diabetes mellitus without complications: Secondary | ICD-10-CM | POA: Diagnosis not present

## 2019-04-29 ENCOUNTER — Other Ambulatory Visit: Payer: Self-pay | Admitting: Physician Assistant

## 2019-04-29 DIAGNOSIS — E1165 Type 2 diabetes mellitus with hyperglycemia: Secondary | ICD-10-CM

## 2019-06-29 ENCOUNTER — Other Ambulatory Visit: Payer: Self-pay | Admitting: Physician Assistant

## 2019-06-29 DIAGNOSIS — E119 Type 2 diabetes mellitus without complications: Secondary | ICD-10-CM

## 2019-06-30 DIAGNOSIS — F17210 Nicotine dependence, cigarettes, uncomplicated: Secondary | ICD-10-CM | POA: Diagnosis not present

## 2019-06-30 DIAGNOSIS — I10 Essential (primary) hypertension: Secondary | ICD-10-CM | POA: Diagnosis not present

## 2019-06-30 DIAGNOSIS — R252 Cramp and spasm: Secondary | ICD-10-CM | POA: Diagnosis not present

## 2019-08-03 DIAGNOSIS — R05 Cough: Secondary | ICD-10-CM | POA: Diagnosis not present

## 2019-08-03 DIAGNOSIS — Z20828 Contact with and (suspected) exposure to other viral communicable diseases: Secondary | ICD-10-CM | POA: Diagnosis not present

## 2019-08-08 ENCOUNTER — Ambulatory Visit (INDEPENDENT_AMBULATORY_CARE_PROVIDER_SITE_OTHER): Payer: Federal, State, Local not specified - PPO | Admitting: Physician Assistant

## 2019-08-08 DIAGNOSIS — Z23 Encounter for immunization: Secondary | ICD-10-CM | POA: Diagnosis not present

## 2019-08-29 ENCOUNTER — Ambulatory Visit (INDEPENDENT_AMBULATORY_CARE_PROVIDER_SITE_OTHER): Payer: Federal, State, Local not specified - PPO | Admitting: Orthopedic Surgery

## 2019-08-29 ENCOUNTER — Encounter: Payer: Self-pay | Admitting: Orthopedic Surgery

## 2019-08-29 VITALS — Ht 63.0 in | Wt 168.0 lb

## 2019-08-29 DIAGNOSIS — M65331 Trigger finger, right middle finger: Secondary | ICD-10-CM | POA: Diagnosis not present

## 2019-08-29 DIAGNOSIS — M7541 Impingement syndrome of right shoulder: Secondary | ICD-10-CM | POA: Diagnosis not present

## 2019-08-29 MED ORDER — LIDOCAINE HCL 1 % IJ SOLN
5.0000 mL | INTRAMUSCULAR | Status: AC | PRN
  Administered 2019-08-29: 5 mL

## 2019-08-29 MED ORDER — METHYLPREDNISOLONE ACETATE 40 MG/ML IJ SUSP
40.0000 mg | INTRAMUSCULAR | Status: AC | PRN
  Administered 2019-08-29: 40 mg via INTRA_ARTICULAR

## 2019-08-29 NOTE — Progress Notes (Signed)
Office Visit Note   Patient: Jacqueline Sparks           Date of Birth: 09/15/1961           MRN: [id number] Visit Date: 08/29/2019              Requested by: Mar Daring, PA-C [address] [place],  [place] [id number] PCP: Mar Daring, PA-C  Chief Complaint  Patient presents with  . Right Shoulder - Pain      HPI: Patient is a 58 year old woman who presents in follow-up for chronic impingement symptoms of her right shoulder.  She states the shots do provide temporary relief but she states the injection has worn off.  She states the pain never goes away.  She is using Tylenol as well as ibuprofen.  Patient states she is also started to get some pain anteriorly of the left shoulder.  Assessment & Plan: Visit Diagnoses:  1. Impingement syndrome of right shoulder   2. Trigger finger, right middle finger     Plan: The right shoulder was injected without problems.  Recommended internal and external rotation stabilizing with her exercise straps for both shoulders as well as scapular stabilization bilaterally.  Patient is given a note that she may not lift greater than 10 pounds no pulling no pushing no overhead work no climbing.  Patient will also need FMLA paperwork completed stating that she could periodically require leave for medical purposes.  Follow-Up Instructions: Return if symptoms worsen or fail to improve.   Ortho Exam  Patient is alert, oriented, no adenopathy, well-dressed, normal affect, normal respiratory effort. Examination right shoulder and left shoulder both have abduction flexion about 120 degrees.  Right shoulder she has pain with Neer and Hawkins impingement test.  Patient also states she feels like she is having some triggering of the right hand.  Examination she does have some palpable triggering at the A1 pulley on the right long and ring finger.  Her finger does not get stuck.  Patient states she has had trigger fingers in the past.   Imaging: No results found. No images are attached to the encounter.  Labs: Lab Results  Component Value Date   HGBA1C 7.2 (H) 03/03/2019   HGBA1C 7.6 (A) 12/08/2018   HGBA1C 10.5 09/18/2017   LABURIC 5.3 04/10/2017     Lab Results  Component Value Date   ALBUMIN 4.2 03/03/2019   ALBUMIN 4.1 11/24/2016   ALBUMIN 4.4 05/26/2016   LABURIC 5.3 04/10/2017    No results found for: MG No results found for: VD25OH  No results found for: PREALBUMIN CBC EXTENDED Latest Ref Rng & Units 03/03/2019 11/24/2016 05/26/2016  WBC 3.4 - 10.8 x10E3/uL 7.5 8.6 10.6  RBC 3.77 - 5.28 x10E6/uL 4.62 4.95 5.05  HGB 11.1 - 15.9 g/dL 12.5 13.8 14.2  HCT 34.0 - 46.6 % 37.4 41.4 42.0  PLT 150 - 450 x10E3/uL 305 375 357  NEUTROABS 1.4 - 7.0 x10E3/uL 3.4 3.9 6.2  LYMPHSABS 0.7 - 3.1 x10E3/uL 3.2(H) 4.1(H) 3.6(H)     Body mass index is 29.76 kg/m.  Orders:  Orders Placed This Encounter  Procedures  . Large Joint Inj   No orders of the defined types were placed in this encounter.    Procedures: Large Joint Inj: R subacromial bursa on 08/29/2019 1:29 PM Indications: diagnostic evaluation and pain Details: 22 G 1.5 in needle, posterior approach  Arthrogram: No  Medications: 5 mL lidocaine 1 %; 40 mg methylPREDNISolone  acetate 40 MG/ML Outcome: tolerated well, no immediate complications Procedure, treatment alternatives, risks and benefits explained, specific risks discussed. Consent was given by the patient. Immediately prior to procedure a time out was called to verify the correct patient, procedure, equipment, support staff and site/side marked as required. Patient was prepped and draped in the usual sterile fashion.      Clinical Data: No additional findings.  ROS:  All other systems negative, except as noted in the HPI. Review of Systems  Objective: Vital Signs: Ht 5\' 3"  (1.6 m)   Wt 168 lb (76.2 kg)   BMI 29.76 kg/m   Specialty Comments:  No specialty comments available.   PMFS History: Patient Active Problem List   Diagnosis Date Noted  . Psoriasis 06/01/2017  . Polyp of sigmoid colon   . Benign neoplasm of descending colon   . Impingement syndrome of left shoulder 10/06/2016  . Impingement syndrome of right shoulder 10/06/2016  . BP (high blood pressure) 11/12/2015  . Abnormal abdominal MRI 11/09/2015  . Abnormal ECG 11/09/2015  . Anxiety 11/09/2015  . Adiposity 01/30/2014  . Smoking 01/30/2014  . Avitaminosis D 05/03/2010  . Obstructive apnea 06/09/2007  . Hypercholesteremia 06/09/2007  . Compulsive tobacco user syndrome 06/09/2007  . Renal tubular disorder 05/19/2007  . Diabetes mellitus, type 2 ([place]) 06/04/2006  . Apnea, sleep 06/04/2006   Past Medical History:  Diagnosis Date  . Anxiety   . Bronchitis   . Diabetes mellitus without complication ([place])   . GERD (gastroesophageal reflux disease)   . Hyperlipidemia   . Sleep apnea     Family History  Problem Relation Age of Onset  . Hyperlipidemia Mother   . Hypertension Mother   . Diabetes Mother   . Diabetes Father   . Hyperlipidemia Father   . Hypertension Father     Past Surgical History:  Procedure Laterality Date  . CARPAL TUNNEL RELEASE  2001   as staated this was the left side, right side was completed in 2003  . COLONOSCOPY WITH PROPOFOL N/A 02/17/2017   Procedure: COLONOSCOPY WITH PROPOFOL;  Surgeon: Lucilla Lame, MD;  Location: [facility] ENDOSCOPY;  Service: Endoscopy;  Laterality: N/A;  . DILATION AND CURETTAGE OF UTERUS  2008   as stated uterine polyps  . ROTATOR CUFF REPAIR Right 3004 and 2010  . TUBAL LIGATION  1999   Social History   Occupational History  . Not on file  Tobacco Use  . Smoking status: Current Every Day Smoker    Packs/day: 1.00    Years: 30.00    Pack years: 30.00    Types: Cigarettes  . Smokeless tobacco: Never Used  Substance and Sexual Activity  . Alcohol use: No  . Drug use: No  . Sexual activity: Never

## 2019-09-04 ENCOUNTER — Other Ambulatory Visit: Payer: Self-pay | Admitting: Physician Assistant

## 2019-09-04 DIAGNOSIS — E7800 Pure hypercholesterolemia, unspecified: Secondary | ICD-10-CM

## 2019-09-05 ENCOUNTER — Telehealth: Payer: Self-pay | Admitting: Orthopedic Surgery

## 2019-09-05 NOTE — Telephone Encounter (Signed)
Patient called advised the S/T disability paperwork was incomplete. Patient said the dates that she was seen in the office were not indicated on the form. Patient asked if the paperwork can be faxed to her. The fax# is [phone]   The number to contact patient is [phone]

## 2019-09-05 NOTE — Telephone Encounter (Signed)
Patient was called to clarify the dates; I filled out missing dates starting from 06/2018 until recent appts and will refax to listed number that patient advised. [phone]

## 2019-09-09 NOTE — Progress Notes (Signed)
I, Jacqueline Sparks ,CMA am acting as a scribe for [employer] PA-C    Patient: Jacqueline Sparks Female    DOB: 08-02-1961   58 y.o.   MRN: [id number] Visit Date: 09/12/2019  Today's Provider: Mar Daring, PA-C   Chief Complaint  Patient presents with  . Follow-up  . Diabetes   Subjective:     HPI   Diabetes Mellitus Type II, Follow-up:   Lab Results  Component Value Date   HGBA1C 7.7 (A) 09/12/2019   HGBA1C 7.2 (H) 03/03/2019   HGBA1C 7.6 (A) 12/08/2018    Last seen for diabetes 6 months ago.  Management since then includes Continue glipizide 10mg  BID, Januvia 100mg  daily, pioglitazone 45mg  daily and trulicity 0.75mg  SQ weekly. She reports good compliance with treatment. She is not having side effects.  Current symptoms include none and have been stable. Home blood sugar records: Has not been monitoring  Episodes of hypoglycemia? yes - Patient felt jittery.   Most Recent Eye Exam: N/A Weight trend: stable Current exercise: at work Current diet habits: in general, a "healthy" diet    Pertinent Labs:    Component Value Date/Time   CHOL 165 03/03/2019 0917   TRIG 232 (H) 03/03/2019 0917   HDL 31 (L) 03/03/2019 0917   LDLCALC 88 03/03/2019 0917   CREATININE 0.72 03/03/2019 0917    Wt Readings from Last 3 Encounters:  09/12/19 163 lb (73.9 kg)  08/29/19 168 lb (76.2 kg)  04/18/19 164 lb 9.6 oz (74.7 kg)   ------------------------------------------------------------------------   No Known Allergies   Current Outpatient Medications:  .  ALPRAZolam (XANAX) 0.5 MG tablet, TAKE 1/2 TO 1 (ONE-HALF TO ONE) TABLET BY MOUTH TWICE DAILY AS NEEDED, Disp: 60 tablet, Rfl: 5 .  atorvastatin (LIPITOR) 20 MG tablet, Take 1 tablet by mouth once daily, Disp: 90 tablet, Rfl: 1 .  clobetasol ointment (TEMOVATE) 0.05 %, Apply topically 2 (two) times daily., Disp: 60 g, Rfl: 5 .  glipiZIDE (GLUCOTROL) 10 MG tablet, TAKE 1 TABLET BY MOUTH TWICE DAILY 30 MINUTES BEFORE  A MEAL, Disp: 180 tablet, Rfl: 1 .  JANUVIA 100 MG tablet, Take 1 tablet by mouth once daily, Disp: 90 tablet, Rfl: 1 .  NON FORMULARY, CPAP, Disp: , Rfl:  .  ONE TOUCH ULTRA TEST test strip, USE ONE STRIP TO CHECK GLUCOSE ONCE DAILY, Disp: 100 each, Rfl: 1 .  pioglitazone (ACTOS) 45 MG tablet, Take 1 tablet by mouth once daily, Disp: 90 tablet, Rfl: 1 .  TRULICITY A999333 0000000 SOPN, INJECT 1 SYRINGEFUL SUBCUTANEOUSLY ONCE A WEEK, Disp: 4 mL, Rfl: 3  Review of Systems  Constitutional: Negative.   Respiratory: Negative.   Cardiovascular: Negative.   Gastrointestinal: Negative.   Endocrine: Negative for polydipsia, polyphagia and polyuria.  Neurological: Negative.     Social History   Tobacco Use  . Smoking status: Current Every Day Smoker    Packs/day: 1.00    Years: 30.00    Pack years: 30.00    Types: Cigarettes  . Smokeless tobacco: Never Used  Substance Use Topics  . Alcohol use: No      Objective:   BP 119/77   Pulse 76   Temp (!) 97.1 F (36.2 C) (Temporal)   Resp 18   Ht 5\' 4"  (1.626 m)   Wt 163 lb (73.9 kg)   BMI 27.98 kg/m  Vitals:   09/12/19 1117  BP: 119/77  Pulse: 76  Resp: 18  Temp: (!) 97.1 F (36.2  C)  TempSrc: Temporal  Weight: 163 lb (73.9 kg)  Height: 5\' 4"  (1.626 m)  Body mass index is 27.98 kg/m.   Physical Exam Vitals signs reviewed.  Constitutional:      General: She is not in acute distress.    Appearance: Normal appearance. She is well-developed. She is not diaphoretic.  Neck:     Musculoskeletal: Normal range of motion and neck supple.     Thyroid: No thyromegaly.     Vascular: No JVD.     Trachea: No tracheal deviation.  Cardiovascular:     Rate and Rhythm: Normal rate and regular rhythm.     Heart sounds: Normal heart sounds. No murmur. No friction rub. No gallop.   Pulmonary:     Effort: Pulmonary effort is normal. No respiratory distress.     Breath sounds: Normal breath sounds. No wheezing or rales.  Lymphadenopathy:      Cervical: No cervical adenopathy.  Neurological:     Mental Status: She is alert.      Results for orders placed or performed in visit on 09/12/19  POCT HgB A1C  Result Value Ref Range   Hemoglobin A1C 7.7 (A) 4.0 - 5.6 %   Est. average glucose Bld gHb Est-mCnc 174        Assessment & Plan    1. Type 2 diabetes mellitus without complication, without long-term current use of insulin (HCC) A1c up to 7.7 from 7.2. Patient admits to not taking the second dose of glipizide at bedtime. Discussed importance of taking all medications as prescribed. Also continue lifestyle modifications. Return in 6 months for next check and CPE.  - Pneumococcal polysaccharide vaccine 23-valent greater than or equal to 2yo subcutaneous/IM  2. Need for pneumococcal vaccination Pneumococcal 23Vaccine given to patient without complications. Patient sat for 15 minutes after administration and was tolerated well without adverse effects. - Pneumococcal polysaccharide vaccine 23-valent greater than or equal to 2yo subcutaneous/IM  3. BMI 27.0-27.9,adult Counseled patient on healthy lifestyle modifications including dieting and exercise.      Mar Daring, PA-C  [facility]

## 2019-09-12 ENCOUNTER — Ambulatory Visit: Payer: Federal, State, Local not specified - PPO | Admitting: Physician Assistant

## 2019-09-12 ENCOUNTER — Other Ambulatory Visit: Payer: Self-pay

## 2019-09-12 ENCOUNTER — Encounter: Payer: Self-pay | Admitting: Physician Assistant

## 2019-09-12 VITALS — BP 119/77 | HR 76 | Temp 97.1°F | Resp 18 | Ht 64.0 in | Wt 163.0 lb

## 2019-09-12 DIAGNOSIS — Z23 Encounter for immunization: Secondary | ICD-10-CM

## 2019-09-12 DIAGNOSIS — Z6827 Body mass index (BMI) 27.0-27.9, adult: Secondary | ICD-10-CM | POA: Diagnosis not present

## 2019-09-12 DIAGNOSIS — E119 Type 2 diabetes mellitus without complications: Secondary | ICD-10-CM | POA: Diagnosis not present

## 2019-09-12 LAB — POCT GLYCOSYLATED HEMOGLOBIN (HGB A1C)
Est. average glucose Bld gHb Est-mCnc: 174
Hemoglobin A1C: 7.7 % — AB (ref 4.0–5.6)

## 2019-09-12 NOTE — Patient Instructions (Signed)

## 2019-09-16 DIAGNOSIS — Z20828 Contact with and (suspected) exposure to other viral communicable diseases: Secondary | ICD-10-CM | POA: Diagnosis not present

## 2019-09-16 DIAGNOSIS — R05 Cough: Secondary | ICD-10-CM | POA: Diagnosis not present

## 2019-10-24 LAB — HM DIABETES EYE EXAM

## 2019-11-25 DIAGNOSIS — Z20828 Contact with and (suspected) exposure to other viral communicable diseases: Secondary | ICD-10-CM | POA: Diagnosis not present

## 2019-11-25 DIAGNOSIS — R05 Cough: Secondary | ICD-10-CM | POA: Diagnosis not present

## 2019-11-27 ENCOUNTER — Other Ambulatory Visit: Payer: Self-pay | Admitting: Physician Assistant

## 2019-11-27 DIAGNOSIS — E119 Type 2 diabetes mellitus without complications: Secondary | ICD-10-CM

## 2019-11-27 NOTE — Telephone Encounter (Signed)
Requested Prescriptions  Pending Prescriptions Disp Refills  . TRULICITY A999333 0000000 SOPN [Pharmacy Med Name: Trulicity A999333 0000000 Subcutaneous Solution Pen-injector] 4 mL 3    Sig: INJECT [place] A WEEK     Endocrinology:  Diabetes - GLP-1 Receptor Agonists Passed - 11/27/2019 10:31 AM      Passed - HBA1C is between 0 and 7.9 and within 180 days    Hemoglobin A1C  Date Value Ref Range Status  09/12/2019 7.7 (A) 4.0 - 5.6 % Final  06/23/2014 8.7  Final   Hgb A1c MFr Bld  Date Value Ref Range Status  03/03/2019 7.2 (H) 4.8 - 5.6 % Final    Comment:             Prediabetes: 5.7 - 6.4          Diabetes: >6.4          Glycemic control for adults with diabetes: <7.0          Passed - Valid encounter within last 6 months    Recent Outpatient Visits          2 months ago Type 2 diabetes mellitus without complication, without long-term current use of insulin [facility])   [facility] [place], [place], PA-C   8 months ago Encounter for annual physical exam   [employer], [place], [place]   10 months ago Pharyngitis, unspecified etiology   [facility] [place], [place]   11 months ago Type 2 diabetes mellitus without complication, without long-term current use of insulin [facility])   [place], [place], [place]   2 years ago Type 2 diabetes mellitus without complication, without long-term current use of insulin [facility])   [place], Clearnce Sorrel, [place]      Future Appointments            In 3 months Burnette, Clearnce Sorrel, PA-C [employer], [facility]

## 2019-11-30 DIAGNOSIS — R5383 Other fatigue: Secondary | ICD-10-CM | POA: Diagnosis not present

## 2019-11-30 DIAGNOSIS — J069 Acute upper respiratory infection, unspecified: Secondary | ICD-10-CM | POA: Diagnosis not present

## 2019-11-30 DIAGNOSIS — T7840XA Allergy, unspecified, initial encounter: Secondary | ICD-10-CM | POA: Diagnosis not present

## 2019-11-30 DIAGNOSIS — Z7984 Long term (current) use of oral hypoglycemic drugs: Secondary | ICD-10-CM | POA: Diagnosis not present

## 2019-11-30 DIAGNOSIS — Z20822 Contact with and (suspected) exposure to covid-19: Secondary | ICD-10-CM | POA: Diagnosis not present

## 2019-11-30 DIAGNOSIS — X58XXXA Exposure to other specified factors, initial encounter: Secondary | ICD-10-CM | POA: Diagnosis not present

## 2019-11-30 DIAGNOSIS — R0602 Shortness of breath: Secondary | ICD-10-CM | POA: Diagnosis not present

## 2019-11-30 DIAGNOSIS — F17200 Nicotine dependence, unspecified, uncomplicated: Secondary | ICD-10-CM | POA: Diagnosis not present

## 2019-11-30 DIAGNOSIS — L409 Psoriasis, unspecified: Secondary | ICD-10-CM | POA: Diagnosis not present

## 2019-11-30 DIAGNOSIS — R05 Cough: Secondary | ICD-10-CM | POA: Diagnosis not present

## 2019-11-30 DIAGNOSIS — E119 Type 2 diabetes mellitus without complications: Secondary | ICD-10-CM | POA: Diagnosis not present

## 2019-12-03 ENCOUNTER — Other Ambulatory Visit: Payer: Self-pay | Admitting: Physician Assistant

## 2019-12-03 DIAGNOSIS — F419 Anxiety disorder, unspecified: Secondary | ICD-10-CM

## 2019-12-03 DIAGNOSIS — E1165 Type 2 diabetes mellitus with hyperglycemia: Secondary | ICD-10-CM

## 2019-12-03 NOTE — Telephone Encounter (Signed)
Requested medication (s) are due for refill today: yes  Requested medication (s) are on the active medication list: yes  Last refill:  07/26/18  Future visit scheduled: yes  Notes to clinic:  medication not delegated to NT to refill   Requested Prescriptions  Pending Prescriptions Disp Refills   ALPRAZolam (XANAX) 0.5 MG tablet [Pharmacy Med Name: ALPRAZolam 0.5 MG Oral Tablet] 60 tablet     Sig: TAKE 1/2 TO 1 (ONE-HALF TO ONE) TABLET BY MOUTH TWICE DAILY AS NEEDED      Not Delegated - Psychiatry:  Anxiolytics/Hypnotics Failed - 12/03/2019  3:09 PM      Failed - This refill cannot be delegated      Failed - Urine Drug Screen completed in last 360 days.      Passed - Valid encounter within last 6 months    Recent Outpatient Visits           2 months ago Type 2 diabetes mellitus without complication, without long-term current use of insulin [facility])   [facility] [place], [place], [place]   9 months ago Encounter for annual physical exam   [facility] [place], [place]   10 months ago Pharyngitis, unspecified etiology   [facility] [place], [place]   12 months ago Type 2 diabetes mellitus without complication, without long-term current use of insulin [facility])   [place], [place], [place]   2 years ago Type 2 diabetes mellitus without complication, without long-term current use of insulin [facility])   [place], Clearnce Sorrel, PA-C       Future Appointments             In 3 months Burnette, Clearnce Sorrel, PA-C [employer], [facility]             Signed Prescriptions Disp Refills   glipiZIDE (GLUCOTROL) 10 MG tablet 180 tablet 0    Sig: TAKE 1 TABLET BY MOUTH TWICE DAILY 30 MINUTES BEFORE A MEAL.      Endocrinology:  Diabetes - Sulfonylureas Passed - 12/03/2019  3:09 PM      Passed - HBA1C is between 0 and 7.9 and within 180 days    Hemoglobin A1C  Date Value  Ref Range Status  09/12/2019 7.7 (A) 4.0 - 5.6 % Final  06/23/2014 8.7  Final   Hgb A1c MFr Bld  Date Value Ref Range Status  03/03/2019 7.2 (H) 4.8 - 5.6 % Final    Comment:             Prediabetes: 5.7 - 6.4          Diabetes: >6.4          Glycemic control for adults with diabetes: <7.0           Passed - Valid encounter within last 6 months    Recent Outpatient Visits           2 months ago Type 2 diabetes mellitus without complication, without long-term current use of insulin [facility] [place] [facility])   [place], [place]   9 months ago Encounter for annual physical exam   [facility] [place], [place]   10 months ago Pharyngitis, unspecified etiology   [facility] [place], [place], [place]   12 months ago Type 2 diabetes mellitus without complication, without long-term current use of insulin [facility])   [facility] [place], [place], [place]   2  years ago Type 2 diabetes mellitus without complication, without long-term current use of insulin [facility])   [place], [place]       Future Appointments             In 3 months Burnette, Clearnce Sorrel, PA-C [employer], [place]

## 2019-12-03 NOTE — Telephone Encounter (Signed)
Requested Prescriptions  Pending Prescriptions Disp Refills  . glipiZIDE (GLUCOTROL) 10 MG tablet [Pharmacy Med Name: glipiZIDE 10 MG Oral Tablet] 180 tablet 0    Sig: TAKE 1 TABLET BY MOUTH TWICE DAILY 30 MINUTES BEFORE A MEAL.     Endocrinology:  Diabetes - Sulfonylureas Passed - 12/03/2019  3:09 PM      Passed - HBA1C is between 0 and 7.9 and within 180 days    Hemoglobin A1C  Date Value Ref Range Status  09/12/2019 7.7 (A) 4.0 - 5.6 % Final  06/23/2014 8.7  Final   Hgb A1c MFr Bld  Date Value Ref Range Status  03/03/2019 7.2 (H) 4.8 - 5.6 % Final    Comment:             Prediabetes: 5.7 - 6.4          Diabetes: >6.4          Glycemic control for adults with diabetes: <7.0          Passed - Valid encounter within last 6 months    Recent Outpatient Visits          2 months ago Type 2 diabetes mellitus without complication, without long-term current use of insulin [facility])   [facility] [place], [place], [place]   9 months ago Encounter for annual physical exam   [facility] [place], [place]   10 months ago Pharyngitis, unspecified etiology   [facility] [place], [place], [place]   12 months ago Type 2 diabetes mellitus without complication, without long-term current use of insulin [facility])   [place], [place], [place]   2 years ago Type 2 diabetes mellitus without complication, without long-term current use of insulin [facility])   [place], Clearnce Sorrel, [place]      Future Appointments            In 3 months Burnette, Clearnce Sorrel, PA-C [employer], [facility]           . ALPRAZolam (XANAX) 0.5 MG tablet [Pharmacy Med Name: ALPRAZolam 0.5 MG Oral Tablet] 60 tablet     Sig: TAKE 1/2 TO 1 (ONE-HALF TO ONE) TABLET BY MOUTH TWICE DAILY AS NEEDED     Not Delegated - Psychiatry:  Anxiolytics/Hypnotics Failed - 12/03/2019  3:09 PM      Failed - This refill cannot be  delegated      Failed - Urine Drug Screen completed in last 360 days.      Passed - Valid encounter within last 6 months    Recent Outpatient Visits          2 months ago Type 2 diabetes mellitus without complication, without long-term current use of insulin [facility])   [facility] [place], [place], [place]   9 months ago Encounter for annual physical exam   [facility] [place], [place]   10 months ago Pharyngitis, unspecified etiology   [facility] F[place], [place]   12 months ago Type 2 diabetes mellitus without complication, without long-term current use of insulin [facility])   [place], [place], [place]   2 years ago Type 2 diabetes mellitus without complication, without long-term current use of insulin [facility])   [place], Clearnce Sorrel, [place]      Future Appointments            In 3 months Burnette, Clearnce Sorrel, [facility], [facility]

## 2020-02-19 ENCOUNTER — Other Ambulatory Visit: Payer: Self-pay | Admitting: Physician Assistant

## 2020-02-19 DIAGNOSIS — E119 Type 2 diabetes mellitus without complications: Secondary | ICD-10-CM

## 2020-02-19 DIAGNOSIS — E1165 Type 2 diabetes mellitus with hyperglycemia: Secondary | ICD-10-CM

## 2020-02-19 NOTE — Telephone Encounter (Signed)
Requested Prescriptions  Pending Prescriptions Disp Refills  . pioglitazone (ACTOS) 45 MG tablet [Pharmacy Med Name: Pioglitazone HCl 45 MG Oral Tablet] 90 tablet 0    Sig: Take 1 tablet by mouth once daily     Endocrinology:  Diabetes - Glitazones - pioglitazone Passed - 02/19/2020  2:35 PM      Passed - HBA1C is between 0 and 7.9 and within 180 days    Hemoglobin A1C  Date Value Ref Range Status  09/12/2019 7.7 (A) 4.0 - 5.6 % Final  06/23/2014 8.7  Final   Hgb A1c MFr Bld  Date Value Ref Range Status  03/03/2019 7.2 (H) 4.8 - 5.6 % Final    Comment:             Prediabetes: 5.7 - 6.4          Diabetes: >6.4          Glycemic control for adults with diabetes: <7.0          Passed - Valid encounter within last 6 months    Recent Outpatient Visits          5 months ago Type 2 diabetes mellitus without complication, without long-term current use of insulin [facility])   [facility] [place], [place], [place]   11 months ago Encounter for annual physical exam   [employer], [place], [place]   1 year ago Pharyngitis, unspecified etiology   [place], [place], [place]   1 year ago Type 2 diabetes mellitus without complication, without long-term current use of insulin [facility])   [place], [place], [place]   2 years ago Type 2 diabetes mellitus without complication, without long-term current use of insulin [facility])   [place], Clearnce Sorrel, [place]      Future Appointments            In 3 weeks Marlyn Corporal, Clearnce Sorrel, PA-C [employer], [facility]           . JANUVIA 100 MG tablet [Pharmacy Med Name: Januvia 100 MG Oral Tablet] 90 tablet 0    Sig: Take 1 tablet by mouth once daily     Endocrinology:  Diabetes - DPP-4 Inhibitors Passed - 02/19/2020  2:35 PM      Passed - HBA1C is between 0 and 7.9 and within 180 days    Hemoglobin A1C  Date Value Ref Range Status   09/12/2019 7.7 (A) 4.0 - 5.6 % Final  06/23/2014 8.7  Final   Hgb A1c MFr Bld  Date Value Ref Range Status  03/03/2019 7.2 (H) 4.8 - 5.6 % Final    Comment:             Prediabetes: 5.7 - 6.4          Diabetes: >6.4          Glycemic control for adults with diabetes: <7.0          Passed - Cr in normal range and within 360 days    Creatinine, Ser  Date Value Ref Range Status  03/03/2019 0.72 0.57 - 1.00 mg/dL Final         Passed - Valid encounter within last 6 months    Recent Outpatient Visits          5 months ago Type 2 diabetes mellitus without complication, without long-term current use of insulin [facility])   [facility] [place], [place], [place]  11 months ago Encounter for annual physical exam   [facility] [place], Clearnce Sorrel, [place]   1 year ago Pharyngitis, unspecified etiology   [place], [place], [place]   1 year ago Type 2 diabetes mellitus without complication, without long-term current use of insulin [facility])   [place], [place], [place]   2 years ago Type 2 diabetes mellitus without complication, without long-term current use of insulin [facility])   [place], Clearnce Sorrel, [place]      Future Appointments            In 3 weeks Marlyn Corporal, Clearnce Sorrel, PA-C [employer], [facility]           . TRULICITY [id number] 0000000 SOPN [Pharmacy Med Name: Trulicity A999333 0000000 Subcutaneous Solution Pen-injector] 12 mL 0    Sig: INJECT [place] A WEEK     Endocrinology:  Diabetes - GLP-1 Receptor Agonists Passed - 02/19/2020  2:35 PM      Passed - HBA1C is between 0 and 7.9 and within 180 days    Hemoglobin A1C  Date Value Ref Range Status  09/12/2019 7.7 (A) 4.0 - 5.6 % Final  06/23/2014 8.7  Final   Hgb A1c MFr Bld  Date Value Ref Range Status  03/03/2019 7.2 (H) 4.8 - 5.6 % Final    Comment:             Prediabetes: 5.7 - 6.4           Diabetes: >6.4          Glycemic control for adults with diabetes: <7.0          Passed - Valid encounter within last 6 months    Recent Outpatient Visits          5 months ago Type 2 diabetes mellitus without complication, without long-term current use of insulin [facility])   [facility] [place], [place], [place]   11 months ago Encounter for annual physical exam   [employer], [place], [place]   1 year ago Pharyngitis, unspecified etiology   [facility] [place], [place], PA-C   1 year ago Type 2 diabetes mellitus without complication, without long-term current use of insulin [facility])   [place], [place], [place]   2 years ago Type 2 diabetes mellitus without complication, without long-term current use of insulin [facility])   [place], Clearnce Sorrel, [place]      Future Appointments            In 3 weeks Burnette, Clearnce Sorrel, PA-C [employer], [facility]

## 2020-03-12 ENCOUNTER — Ambulatory Visit: Payer: Self-pay | Admitting: Physician Assistant

## 2020-03-12 NOTE — Progress Notes (Deleted)
     Established patient visit   Patient: Jacqueline Sparks   DOB: 15-Nov-1960   59 y.o. Female  MRN: [id number] Visit Date: 03/12/2020  Today's healthcare provider: Mar Daring, PA-C   No chief complaint on file.  Subjective    HPI Diabetes Mellitus Type II, Follow-up  Lab Results  Component Value Date   HGBA1C 7.7 (A) 09/12/2019   HGBA1C 7.2 (H) 03/03/2019   HGBA1C 7.6 (A) 12/08/2018   Wt Readings from Last 3 Encounters:  09/12/19 163 lb (73.9 kg)  08/29/19 168 lb (76.2 kg)  04/18/19 164 lb 9.6 oz (74.7 kg)   Last seen for diabetes 6 months ago.  Management since then includes Discussed importance of taking all medications as prescribed. Also continue lifestyle modifications She reports {excellent/good/fair/poor:19665} compliance with treatment. She {is/is not:21021397} having side effects. {document side effects if present:1} Symptoms: {Yes/No:20286} fatigue {Yes/No:20286} foot ulcerations  {Yes/No:20286} appetite changes {Yes/No:20286} nausea  {Yes/No:20286} paresthesia of the feet  {Yes/No:20286} polydipsia  {Yes/No:20286} polyuria {Yes/No:20286} visual disturbances   {Yes/No:20286} vomiting     Home blood sugar records: {diabetes glucometry results:16657}  Episodes of hypoglycemia? {Yes/No:20286} {enter symptoms and frequency of symptoms if yes:1}   Current insulin regiment: {enter 'none' or type of insulin and number of units taken with each dose of each insulin formulation that the patient is taking:1} Most Recent Eye Exam: *** {Current exercise:16438:::1} {Current diet habits:16563:::1}  Pertinent Labs: Lab Results  Component Value Date   CHOL 165 03/03/2019   HDL 31 (L) 03/03/2019   LDLCALC 88 03/03/2019   TRIG 232 (H) 03/03/2019   CHOLHDL 5.3 (H) 03/03/2019   Lab Results  Component Value Date   NA 144 03/03/2019   K 4.1 03/03/2019   CREATININE 0.72 03/03/2019   GFRNONAA 94 03/03/2019   GFRAA 108 03/03/2019   GLUCOSE 123 (H) 03/03/2019      ---------------------------------------------------------------------------------------------------  {Show patient history (optional):23778::" "}   Medications: Outpatient Medications Prior to Visit  Medication Sig  . ALPRAZolam (XANAX) 0.5 MG tablet TAKE 1/2 TO 1 (ONE-HALF TO ONE) TABLET BY MOUTH TWICE DAILY AS NEEDED  . atorvastatin (LIPITOR) 20 MG tablet Take 1 tablet by mouth once daily  . clobetasol ointment (TEMOVATE) 0.05 % Apply topically 2 (two) times daily.  Marland Kitchen glipiZIDE (GLUCOTROL) 10 MG tablet TAKE 1 TABLET BY MOUTH TWICE DAILY 30 MINUTES BEFORE A MEAL.  Marland Kitchen JANUVIA 100 MG tablet Take 1 tablet by mouth once daily  . NON FORMULARY CPAP  . ONE TOUCH ULTRA TEST test strip USE ONE STRIP TO CHECK GLUCOSE ONCE DAILY  . pioglitazone (ACTOS) 45 MG tablet Take 1 tablet by mouth once daily  . TRULICITY [id number] 0000000 SOPN INJECT 1 SYRINGEFUL SUBCUTANEOUSLY ONCE A WEEK   No facility-administered medications prior to visit.    Review of Systems  {Heme  Chem  Endocrine  Serology  Results Review (optional):23779::" "}  Objective    There were no vitals taken for this visit. {Show previous vital signs (optional):23777::" "}  Physical Exam  ***  No results found for any visits on 03/12/20.  Assessment & Plan     ***  No follow-ups on file.      {provider attestation***:1}   [place]  [facility] [phone] (phone) [phone] (fax)  [place]

## 2020-03-15 ENCOUNTER — Encounter: Payer: Self-pay | Admitting: Physician Assistant

## 2020-03-15 ENCOUNTER — Ambulatory Visit: Payer: Federal, State, Local not specified - PPO | Admitting: Physician Assistant

## 2020-03-15 ENCOUNTER — Other Ambulatory Visit: Payer: Self-pay

## 2020-03-15 VITALS — BP 105/65 | HR 82 | Temp 97.1°F | Resp 16 | Ht 63.0 in | Wt 168.0 lb

## 2020-03-15 DIAGNOSIS — E7800 Pure hypercholesterolemia, unspecified: Secondary | ICD-10-CM

## 2020-03-15 DIAGNOSIS — E119 Type 2 diabetes mellitus without complications: Secondary | ICD-10-CM

## 2020-03-15 DIAGNOSIS — G4733 Obstructive sleep apnea (adult) (pediatric): Secondary | ICD-10-CM

## 2020-03-15 DIAGNOSIS — E559 Vitamin D deficiency, unspecified: Secondary | ICD-10-CM

## 2020-03-15 DIAGNOSIS — L409 Psoriasis, unspecified: Secondary | ICD-10-CM

## 2020-03-15 DIAGNOSIS — R400 Somnolence: Secondary | ICD-10-CM

## 2020-03-15 DIAGNOSIS — F17200 Nicotine dependence, unspecified, uncomplicated: Secondary | ICD-10-CM

## 2020-03-15 DIAGNOSIS — Z1239 Encounter for other screening for malignant neoplasm of breast: Secondary | ICD-10-CM

## 2020-03-15 DIAGNOSIS — Z9889 Other specified postprocedural states: Secondary | ICD-10-CM

## 2020-03-15 LAB — POCT GLYCOSYLATED HEMOGLOBIN (HGB A1C)
Est. average glucose Bld gHb Est-mCnc: 163
Hemoglobin A1C: 7.3 % — AB (ref 4.0–5.6)

## 2020-03-15 MED ORDER — CLOBETASOL PROPIONATE 0.05 % EX OINT: TOPICAL_OINTMENT | Freq: Two times a day (BID) | CUTANEOUS | 5 refills | Status: DC

## 2020-03-15 MED ORDER — MOMETASONE FUROATE 0.1 % EX CREA: 1.0000 "application " | TOPICAL_CREAM | Freq: Every day | CUTANEOUS | 1 refills | Status: DC

## 2020-03-15 NOTE — Progress Notes (Signed)
Established patient visit   Patient: Jacqueline Sparks   DOB: [birth year]   59 y.o. Female  MRN: [id number] Visit Date: 03/15/2020  Today's healthcare provider: Mar Daring, PA-C   Chief Complaint  Patient presents with  . Diabetes  . Hyperlipidemia   Subjective    HPI Diabetes Mellitus Type II, Follow-up  Lab Results  Component Value Date   HGBA1C 7.3 (A) 03/15/2020   HGBA1C 7.7 (A) 09/12/2019   HGBA1C 7.2 (H) 03/03/2019   Wt Readings from Last 3 Encounters:  03/15/20 168 lb (76.2 kg)  09/12/19 163 lb (73.9 kg)  08/29/19 168 lb (76.2 kg)   Last seen for diabetes 6 months ago.  Management since then includes Discussed importance of taking all medications as prescribed. Also continue lifestyle modifications. She reports excellent compliance with treatment. She is not having side effects.  Symptoms: No fatigue No foot ulcerations  No appetite changes No nausea  No paresthesia of the feet  Yes polydipsia  Yes polyuria Yes visual disturbances   No vomiting     Home blood sugar records: fasting range: not being checked  Episodes of hypoglycemia? No    Current insulin regiment: none Most Recent Eye Exam: [place] 3-4 months ago Current exercise: no regular exercise Current diet habits: well balanced  Pertinent Labs: Lab Results  Component Value Date   CHOL 165 03/03/2019   HDL 31 (L) 03/03/2019   LDLCALC 88 03/03/2019   TRIG 232 (H) 03/03/2019   CHOLHDL 5.3 (H) 03/03/2019   Lab Results  Component Value Date   NA 144 03/03/2019   K 4.1 03/03/2019   CREATININE 0.72 03/03/2019   GFRNONAA 94 03/03/2019   GFRAA 108 03/03/2019   GLUCOSE 123 (H) 03/03/2019     ---------------------------------------------------------------------------------------------------  Lipid/Cholesterol, Follow-up  Last lipid panel Other pertinent labs  Lab Results  Component Value Date   CHOL 165 03/03/2019   HDL 31 (L) 03/03/2019   LDLCALC 88 03/03/2019   TRIG 232 (H)  03/03/2019   CHOLHDL 5.3 (H) 03/03/2019   Lab Results  Component Value Date   ALT 10 03/03/2019   AST 13 03/03/2019   PLT 305 03/03/2019   TSH 0.995 03/03/2019     She was last seen for this 1 years ago.  Management since that visit includes no changes.  She reports excellent compliance with treatment. She is not having side effects.   Symptoms: No chest pain No chest pressure/discomfort  No dyspnea No lower extremity edema  No numbness or tingling of extremity No orthopnea  No palpitations No paroxysmal nocturnal dyspnea  No speech difficulty No syncope   Current diet: well balanced Current exercise: none  The 10-year ASCVD risk score Mikey Bussing DC Jr., et al., 2013) is: 12.7%  --------------------------------------------------------------------------------------------------- Follow up for psoriasis  The patient was last seen for this 1 years ago. Changes made at last visit include no changes.  She reports excellent compliance with treatment. She feels that condition is Improved. She is not having side effects.   -----------------------------------------------------------------------------------------   Patient Active Problem List   Diagnosis Date Noted  . Psoriasis 06/01/2017  . Polyp of sigmoid colon   . Benign neoplasm of descending colon   . Impingement syndrome of left shoulder 10/06/2016  . Impingement syndrome of right shoulder 10/06/2016  . BP (high blood pressure) 11/12/2015  . Abnormal abdominal MRI 11/09/2015  . Abnormal ECG 11/09/2015  . Anxiety 11/09/2015  . Obesity 01/30/2014  . Smoking 01/30/2014  . Avitaminosis  D 05/03/2010  . Obstructive apnea 06/09/2007  . Hypercholesteremia 06/09/2007  . Compulsive tobacco user syndrome 06/09/2007  . Renal tubular disorder 05/19/2007  . Diabetes mellitus, type 2 ([place]) 06/04/2006   Social History   Tobacco Use  . Smoking status: Current Every Day Smoker    Packs/day: 1.00    Years: 30.00    Pack years:  30.00    Types: Cigarettes  . Smokeless tobacco: Never Used  Substance Use Topics  . Alcohol use: No  . Drug use: No       Medications: Outpatient Medications Prior to Visit  Medication Sig  . ALPRAZolam (XANAX) 0.5 MG tablet TAKE 1/2 TO 1 (ONE-HALF TO ONE) TABLET BY MOUTH TWICE DAILY AS NEEDED  . atorvastatin (LIPITOR) 20 MG tablet Take 1 tablet by mouth once daily  . glipiZIDE (GLUCOTROL) 10 MG tablet TAKE 1 TABLET BY MOUTH TWICE DAILY 30 MINUTES BEFORE A MEAL.  Marland Kitchen JANUVIA 100 MG tablet Take 1 tablet by mouth once daily  . NON FORMULARY CPAP  . ONE TOUCH ULTRA TEST test strip USE ONE STRIP TO CHECK GLUCOSE ONCE DAILY  . pioglitazone (ACTOS) 45 MG tablet Take 1 tablet by mouth once daily  . TRULICITY A999333 0000000 SOPN INJECT 1 SYRINGEFUL SUBCUTANEOUSLY ONCE A WEEK  . [DISCONTINUED] clobetasol ointment (TEMOVATE) 0.05 % Apply topically 2 (two) times daily.   No facility-administered medications prior to visit.    Review of Systems  Constitutional: Negative for activity change, appetite change, chills, fatigue and unexpected weight change.  Eyes: Positive for visual disturbance.  Respiratory: Negative for chest tightness and shortness of breath.   Cardiovascular: Negative for chest pain and palpitations.  Endocrine: Positive for polyuria. Negative for polydipsia and polyphagia.    Last metabolic panel Lab Results  Component Value Date   GLUCOSE 123 (H) 03/03/2019   NA 144 03/03/2019   K 4.1 03/03/2019   CL 104 03/03/2019   CO2 24 03/03/2019   BUN 5 (L) 03/03/2019   CREATININE 0.72 03/03/2019   GFRNONAA 94 03/03/2019   GFRAA 108 03/03/2019   CALCIUM 9.1 03/03/2019   PROT 7.0 03/03/2019   ALBUMIN 4.2 03/03/2019   LABGLOB 2.8 03/03/2019   AGRATIO 1.5 03/03/2019   BILITOT <0.2 03/03/2019   ALKPHOS 74 03/03/2019   AST 13 03/03/2019   ALT 10 03/03/2019   Last lipids Lab Results  Component Value Date   CHOL 165 03/03/2019   HDL 31 (L) 03/03/2019   LDLCALC 88  03/03/2019   TRIG 232 (H) 03/03/2019   CHOLHDL 5.3 (H) 03/03/2019   Last thyroid functions Lab Results  Component Value Date   TSH 0.995 03/03/2019      Objective    BP 105/65 (BP Location: Left Arm, Patient Position: Sitting, Cuff Size: Normal)   Pulse 82   Temp (!) 97.1 F (36.2 C) (Temporal)   Resp 16   Ht 5\' 3"  (1.6 m)   Wt 168 lb (76.2 kg)   BMI 29.76 kg/m  BP Readings from Last 3 Encounters:  03/15/20 105/65  09/12/19 119/77  03/03/19 135/82   Wt Readings from Last 3 Encounters:  03/15/20 168 lb (76.2 kg)  09/12/19 163 lb (73.9 kg)  08/29/19 168 lb (76.2 kg)      Physical Exam Vitals reviewed.  Constitutional:      General: She is not in acute distress.    Appearance: Normal appearance. She is well-developed. She is not ill-appearing or diaphoretic.  Cardiovascular:     Rate  and Rhythm: Normal rate and regular rhythm.     Pulses: Normal pulses.     Heart sounds: Normal heart sounds. No murmur. No friction rub. No gallop.   Pulmonary:     Effort: Pulmonary effort is normal. No respiratory distress.     Breath sounds: Normal breath sounds. No wheezing or rales.  Musculoskeletal:        General: Normal range of motion.     Cervical back: Normal range of motion and neck supple.     Right lower leg: No edema.     Left lower leg: No edema.  Skin:    General: Skin is warm and dry.     Findings: No erythema.  Neurological:     Mental Status: She is alert.  Psychiatric:        Mood and Affect: Mood normal.        Thought Content: Thought content normal.      Results for orders placed or performed in visit on 03/15/20  Microalbumin, urine  Result Value Ref Range   Microalbumin, Urine <3.0 Not Estab. ug/mL  POCT glycosylated hemoglobin (Hb A1C)  Result Value Ref Range   Hemoglobin A1C 7.3 (A) 4.0 - 5.6 %   Est. average glucose Bld gHb Est-mCnc 163     Assessment & Plan     1. Type 2 diabetes mellitus without complication, without long-term current  use of insulin (HCC) A1c improved from 7.7 to 7.3. Continue dietary limitations and glipizide XR 10mg , Januvia 100mg , pioglitazone 45mg  and Trulicity 0.75mg . Will check labs as below and f/u pending results. Microalbumin, urine - CBC w/Diff/Platelet - Comprehensive Metabolic Panel (CMET) - Lipid Panel With LDL/HDL Ratio  2. Hypercholesteremia Stable on Atorvastatin 20mg . Will check labs as below and f/u pending results. - CBC w/Diff/Platelet - Comprehensive Metabolic Panel (CMET) - Lipid Panel With LDL/HDL Ratio  3. Psoriasis Stable. Diagnosis pulled for medication refill. Continue current medical treatment plan. - mometasone (ELOCON) 0.1 % cream; Apply 1 application topically daily.  Dispense: 45 g; Refill: 1  4. Compulsive tobacco user syndrome Declines wanting to quit at this time.   5. Encounter for breast cancer screening using non-mammogram modality There is no family history of breast cancer. She does perform regular self breast exams. Mammogram was ordered as below. Information for [facility] was given to patient so she may schedule her mammogram at her convenience. - MM DIAG BREAST TOMO BILATERAL; Future  6. History of left breast biopsy Personal in left breast.  - MM DIAG BREAST TOMO BILATERAL; Future  7. Avitaminosis D H/O this and post menopausal. Will check labs as below and f/u pending results. - Vitamin D (25 hydroxy)  8. Obstructive apnea Uses CPAP nightly. Works well. Declines new machine at this time.   9. Daytime somnolence Will check labs as below and f/u pending results. - TSH - B12   Return in about 6 months (around 09/15/2020).      Reynolds Bowl, PA-C, have reviewed all documentation for this visit. The documentation on 03/20/20 for the exam, diagnosis, procedures, and orders are all accurate and complete.   Rubye Beach  [facility] [phone] (phone) [phone] (fax)  [place]

## 2020-03-15 NOTE — Patient Instructions (Signed)

## 2020-03-16 ENCOUNTER — Encounter: Payer: Self-pay | Admitting: Physician Assistant

## 2020-03-16 LAB — MICROALBUMIN, URINE: Microalbumin, Urine: <3 ug/mL

## 2020-03-20 ENCOUNTER — Encounter: Payer: Self-pay | Admitting: Physician Assistant

## 2020-03-26 DIAGNOSIS — E7800 Pure hypercholesterolemia, unspecified: Secondary | ICD-10-CM | POA: Diagnosis not present

## 2020-03-26 DIAGNOSIS — E559 Vitamin D deficiency, unspecified: Secondary | ICD-10-CM | POA: Diagnosis not present

## 2020-03-26 DIAGNOSIS — R400 Somnolence: Secondary | ICD-10-CM | POA: Diagnosis not present

## 2020-03-26 DIAGNOSIS — E119 Type 2 diabetes mellitus without complications: Secondary | ICD-10-CM | POA: Diagnosis not present

## 2020-03-27 ENCOUNTER — Telehealth: Payer: Self-pay

## 2020-03-27 LAB — CBC WITH DIFFERENTIAL/PLATELET
Basophils Absolute: 0.1 10*3/uL (ref 0.0–0.2)
Basos: 1 %
EOS (ABSOLUTE): 0.1 10*3/uL (ref 0.0–0.4)
Eos: 2 %
Hematocrit: 41.8 % (ref 34.0–46.6)
Hemoglobin: 13.5 g/dL (ref 11.1–15.9)
Immature Grans (Abs): 0 10*3/uL (ref 0.0–0.1)
Immature Granulocytes: 0 %
Lymphocytes Absolute: 3.7 10*3/uL — ABNORMAL HIGH (ref 0.7–3.1)
Lymphs: 49 %
MCH: 26.7 pg (ref 26.6–33.0)
MCHC: 32.3 g/dL (ref 31.5–35.7)
MCV: 83 fL (ref 79–97)
Monocytes Absolute: 0.5 10*3/uL (ref 0.1–0.9)
Monocytes: 6 %
Neutrophils Absolute: 3.2 10*3/uL (ref 1.4–7.0)
Neutrophils: 42 %
Platelets: 308 10*3/uL (ref 150–450)
RBC: 5.06 x10E6/uL (ref 3.77–5.28)
RDW: 14.4 % (ref 11.7–15.4)
WBC: 7.5 10*3/uL (ref 3.4–10.8)

## 2020-03-27 LAB — LIPID PANEL WITH LDL/HDL RATIO
Cholesterol, Total: 155 mg/dL (ref 100–199)
HDL: 30 mg/dL — ABNORMAL LOW (ref >39)
LDL Chol Calc (NIH): 91 mg/dL (ref 0–99)
LDL/HDL Ratio: 3 ratio (ref 0.0–3.2)
Triglycerides: 198 mg/dL — ABNORMAL HIGH (ref 0–149)
VLDL Cholesterol Cal: 34 mg/dL (ref 5–40)

## 2020-03-27 LAB — COMPREHENSIVE METABOLIC PANEL
ALT: 15 IU/L (ref 0–32)
AST: 21 IU/L (ref 0–40)
Albumin/Globulin Ratio: 1.5 (ref 1.2–2.2)
Albumin: 4.4 g/dL (ref 3.8–4.9)
Alkaline Phosphatase: 88 IU/L (ref 48–121)
BUN/Creatinine Ratio: 7 — ABNORMAL LOW (ref 9–23)
BUN: 5 mg/dL — ABNORMAL LOW (ref 6–24)
Bilirubin Total: <0.2 mg/dL (ref 0.0–1.2)
CO2: 26 mmol/L (ref 20–29)
Calcium: 9.4 mg/dL (ref 8.7–10.2)
Chloride: 105 mmol/L (ref 96–106)
Creatinine, Ser: 0.76 mg/dL (ref 0.57–1.00)
GFR calc Af Amer: 101 mL/min/{1.73_m2} (ref >59)
GFR calc non Af Amer: 87 mL/min/{1.73_m2} (ref >59)
Globulin, Total: 3 g/dL (ref 1.5–4.5)
Glucose: 101 mg/dL — ABNORMAL HIGH (ref 65–99)
Potassium: 4.3 mmol/L (ref 3.5–5.2)
Sodium: 143 mmol/L (ref 134–144)
Total Protein: 7.4 g/dL (ref 6.0–8.5)

## 2020-03-27 LAB — VITAMIN D 25 HYDROXY (VIT D DEFICIENCY, FRACTURES): Vit D, 25-Hydroxy: 42.8 ng/mL (ref 30.0–100.0)

## 2020-03-27 LAB — VITAMIN B12: Vitamin B-12: >2000 pg/mL — ABNORMAL HIGH (ref 232–1245)

## 2020-03-27 LAB — TSH: TSH: 0.971 u[IU]/mL (ref 0.450–4.500)

## 2020-03-27 NOTE — Telephone Encounter (Signed)
-----   Message from Mar Daring, [place] sent at 03/27/2020 11:37 AM EDT ----- Blood count is stable and normal. Kidney and liver function are normal. Fasting sugar is ok at 101. Sodium, potassium and calcium are normal. Thyroid is normal. Cholesterol is doing ok and improved from last year. Your triglycerides continue to improve. This is the part that is most closely associated with dietary habits, so keep up the good work. Vit D is normal. B12 is elevated so could cut back to every other day dosing of any multivitamin or B12/B complex supplement.

## 2020-03-27 NOTE — Telephone Encounter (Signed)
Patient advised as directed below. 

## 2020-04-03 ENCOUNTER — Encounter: Payer: Self-pay | Admitting: Family

## 2020-04-03 ENCOUNTER — Ambulatory Visit: Payer: Self-pay

## 2020-04-03 ENCOUNTER — Ambulatory Visit (INDEPENDENT_AMBULATORY_CARE_PROVIDER_SITE_OTHER): Payer: Federal, State, Local not specified - PPO | Admitting: Family

## 2020-04-03 ENCOUNTER — Other Ambulatory Visit: Payer: Self-pay

## 2020-04-03 VITALS — Ht 63.0 in | Wt 168.0 lb

## 2020-04-03 DIAGNOSIS — M25511 Pain in right shoulder: Secondary | ICD-10-CM

## 2020-04-03 DIAGNOSIS — G8929 Other chronic pain: Secondary | ICD-10-CM | POA: Diagnosis not present

## 2020-04-03 DIAGNOSIS — M7541 Impingement syndrome of right shoulder: Secondary | ICD-10-CM

## 2020-04-03 NOTE — Progress Notes (Signed)
Office Visit Note   Patient: Jacqueline Sparks           Date of Birth: Feb 21, 1961           MRN: [id number] Visit Date: 04/03/2020              Requested by: Mar Daring, PA-C [address] [place],  [place] [address] PCP: Mar Daring, PA-C  Chief Complaint  Patient presents with  . Right Shoulder - Follow-up    S/p injection 08/29/2019      HPI: Patient is a 59 year old woman who is well-known to our office.  She presents today in follow-up for chronic impingement syndrome of the right shoulder.  She feels that the Depo-Medrol injections she has been undergoing are no longer helpful.  The pain has not subsided following her last injection this was in November of last year.  She is using Tylenol and ibuprofen as needed for pain.  Unfortunately she states she has had an acute event that worsened her pain following reaching into her back pocket with her right arm she had immediate onset of worsening of her pain she is feels she has had to use her left arm to hold up her right arm while she is working.  She has had loss of range of motion and tenderness globally to the right shoulder.  She feels she cannot sleep on her right side.  Assessment & Plan: Visit Diagnoses:  1. Impingement syndrome of right shoulder   2. Right shoulder pain, unspecified chronicity   3. Chronic pain in right shoulder     Plan: Radiographs of the right shoulder were negative for acute finding today.  We will proceed with MRI to rule out tendon tear.  Patient declined Depo-Medrol injection at this time.  We will keep her out of work for about 2 weeks so she can rest this shoulder.  Her CA 17 form was filled out today. Patient is given a note that she may not lift greater than 10 pounds no pulling no pushing no overhead work no climbing when she returns to work in 2 weeks  Follow-Up Instructions: Return mr review.   Left Shoulder Exam   Tenderness  Left shoulder tenderness location:  Global.  Tests  Impingement: positive Drop arm: negative  Comments:  Full passive range of motion.  Active abduction to 80 degrees.      Patient is alert, oriented, no adenopathy, well-dressed, normal affect, normal respiratory effort.   Imaging: No results found. No images are attached to the encounter.  Labs: Lab Results  Component Value Date   HGBA1C 7.3 (A) 03/15/2020   HGBA1C 7.7 (A) 09/12/2019   HGBA1C 7.2 (H) 03/03/2019   LABURIC 5.3 04/10/2017     Lab Results  Component Value Date   ALBUMIN 4.4 03/26/2020   ALBUMIN 4.2 03/03/2019   ALBUMIN 4.1 11/24/2016   LABURIC 5.3 04/10/2017    No results found for: MG Lab Results  Component Value Date   VD25OH 42.8 03/26/2020    No results found for: PREALBUMIN CBC EXTENDED Latest Ref Rng & Units 03/26/2020 03/03/2019 11/24/2016  WBC 3.4 - 10.8 x10E3/uL 7.5 7.5 8.6  RBC 3.77 - 5.28 x10E6/uL 5.06 4.62 4.95  HGB 11.1 - 15.9 g/dL 13.5 12.5 13.8  HCT 34.0 - 46.6 % 41.8 37.4 41.4  PLT 150 - 450 x10E3/uL 308 305 375  NEUTROABS 1 - 7 x10E3/uL 3.2 3.4 3.9  LYMPHSABS 0 - 3 x10E3/uL 3.7(H) 3.2(H) 4.1(H)  Body mass index is 29.76 kg/m.  Orders:  Orders Placed This Encounter  Procedures  . XR Shoulder Right   No orders of the defined types were placed in this encounter.    Procedures: No procedures performed  Clinical Data: No additional findings.  ROS:  All other systems negative, except as noted in the HPI. Review of Systems  Constitutional: Negative for chills and fever.  Musculoskeletal: Positive for arthralgias and myalgias. Negative for joint swelling.  Neurological: Negative for weakness and numbness.    Objective: Vital Signs: Ht 5\' 3"  (1.6 m)   Wt 168 lb (76.2 kg)   BMI 29.76 kg/m   Specialty Comments:  No specialty comments available.  PMFS History: Patient Active Problem List   Diagnosis Date Noted  . Psoriasis 06/01/2017  . Polyp of sigmoid colon   . Benign neoplasm of  descending colon   . Impingement syndrome of left shoulder 10/06/2016  . Impingement syndrome of right shoulder 10/06/2016  . BP (high blood pressure) 11/12/2015  . Abnormal abdominal MRI 11/09/2015  . Abnormal ECG 11/09/2015  . Anxiety 11/09/2015  . Obesity 01/30/2014  . Smoking 01/30/2014  . Avitaminosis D 05/03/2010  . Obstructive apnea 06/09/2007  . Hypercholesteremia 06/09/2007  . Compulsive tobacco user syndrome 06/09/2007  . Renal tubular disorder 05/19/2007  . Diabetes mellitus, type 2 ([place]) 06/04/2006   Past Medical History:  Diagnosis Date  . Anxiety   . Bronchitis   . Diabetes mellitus without complication ([place])   . GERD (gastroesophageal reflux disease)   . Hyperlipidemia   . Sleep apnea     Family History  Problem Relation Age of Onset  . Hyperlipidemia Mother   . Hypertension Mother   . Diabetes Mother   . Diabetes Father   . Hyperlipidemia Father   . Hypertension Father     Past Surgical History:  Procedure Laterality Date  . CARPAL TUNNEL RELEASE  2001   as staated this was the left side, right side was completed in 2003  . COLONOSCOPY WITH PROPOFOL N/A 02/17/2017   Procedure: COLONOSCOPY WITH PROPOFOL;  Surgeon: Lucilla Lame, MD;  Location: [facility] ENDOSCOPY;  Service: Endoscopy;  Laterality: N/A;  . DILATION AND CURETTAGE OF UTERUS  2008   as stated uterine polyps  . ROTATOR CUFF REPAIR Right 3004 and 2010  . TUBAL LIGATION  1999   Social History   Occupational History  . Not on file  Tobacco Use  . Smoking status: Current Every Day Smoker    Packs/day: 1.00    Years: 30.00    Pack years: 30.00    Types: Cigarettes  . Smokeless tobacco: Never Used  Vaping Use  . Vaping Use: Never used  Substance and Sexual Activity  . Alcohol use: No  . Drug use: No  . Sexual activity: Never

## 2020-04-10 ENCOUNTER — Telehealth: Payer: Self-pay | Admitting: Orthopedic Surgery

## 2020-04-10 NOTE — Telephone Encounter (Signed)
Received vm from pt. IC,lmvm for patient.

## 2020-04-13 ENCOUNTER — Telehealth: Payer: Self-pay

## 2020-04-13 NOTE — Telephone Encounter (Signed)
Patient called in wanting a work note to be able to go back into work in July. Also wanted to re open her workers comp case and need a medical narrative stating why she is out of work . Narrative can not use the word "pain" .

## 2020-04-16 ENCOUNTER — Encounter: Payer: Self-pay | Admitting: Orthopedic Surgery

## 2020-04-16 NOTE — Telephone Encounter (Signed)
Patient stopped by to follow up on this note.   Needs it as soon as possible and asked that we fax it to [phone]

## 2020-04-16 NOTE — Telephone Encounter (Signed)
Patient work note completed and faxed to providing number at [phone] per patient's request to take out of work for Right shoulder pain following MRI review on 05/15/2020.

## 2020-04-16 NOTE — Telephone Encounter (Signed)
Hey Amy,  Are you able to re-open this patient's workers comp case? Please advise, Thank you.

## 2020-04-17 ENCOUNTER — Ambulatory Visit: Payer: Federal, State, Local not specified - PPO | Admitting: Family

## 2020-05-08 ENCOUNTER — Telehealth: Payer: Self-pay | Admitting: Orthopedic Surgery

## 2020-05-08 ENCOUNTER — Other Ambulatory Visit: Payer: Self-pay

## 2020-05-08 NOTE — Telephone Encounter (Signed)
Pt called stating her MRI isnt until 05/27/20 and Junie Panning wanted her out of work until after it was done; pt would like a work note faxed to her.   Fax# [phone]

## 2020-05-08 NOTE — Telephone Encounter (Signed)
Letter written to excuse from work until after appt on 05/29/20 to review results. Faxed to number as requested and to call with any questions.

## 2020-05-15 ENCOUNTER — Ambulatory Visit: Payer: Federal, State, Local not specified - PPO | Admitting: Family

## 2020-05-24 ENCOUNTER — Telehealth: Payer: Self-pay

## 2020-05-24 DIAGNOSIS — L409 Psoriasis, unspecified: Secondary | ICD-10-CM

## 2020-05-24 MED ORDER — CLOBETASOL PROPIONATE 0.05 % EX OINT: 1.0000 "application " | TOPICAL_OINTMENT | Freq: Two times a day (BID) | CUTANEOUS | 0 refills | Status: DC

## 2020-05-24 NOTE — Telephone Encounter (Signed)
Clobetasol sent in to [facility] [address]

## 2020-05-24 NOTE — Telephone Encounter (Signed)
Copied from [place] [id number]. Topic: General - Inquiry >> May 24, 2020  1:47 PM Gillis Ends D wrote: Reason for CRM: The medicine or cream that she has isn't working. 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 Please advise

## 2020-05-24 NOTE — Telephone Encounter (Signed)
Left patient a message advising her RX has been sent to pharmacy

## 2020-05-24 NOTE — Addendum Note (Signed)
Addended by: Mar Daring on: 05/24/2020 03:49 PM   Modules accepted: Orders

## 2020-05-27 ENCOUNTER — Ambulatory Visit
Admission: RE | Admit: 2020-05-27 | Discharge: 2020-05-27 | Disposition: A | Discharge status: Home / Self Care | Payer: Federal, State, Local not specified - PPO | Source: Ambulatory Visit | Attending: Family | Admitting: Family

## 2020-05-27 ENCOUNTER — Other Ambulatory Visit: Payer: Self-pay

## 2020-05-27 DIAGNOSIS — G8929 Other chronic pain: Secondary | ICD-10-CM

## 2020-05-27 DIAGNOSIS — M7541 Impingement syndrome of right shoulder: Secondary | ICD-10-CM

## 2020-05-27 DIAGNOSIS — M25511 Pain in right shoulder: Secondary | ICD-10-CM | POA: Diagnosis not present

## 2020-05-29 ENCOUNTER — Encounter: Payer: Self-pay | Admitting: Family

## 2020-05-29 ENCOUNTER — Ambulatory Visit: Payer: Federal, State, Local not specified - PPO | Admitting: Orthopedic Surgery

## 2020-05-29 VITALS — Ht 63.0 in | Wt 168.0 lb

## 2020-05-29 DIAGNOSIS — M19011 Primary osteoarthritis, right shoulder: Secondary | ICD-10-CM

## 2020-05-29 DIAGNOSIS — M65331 Trigger finger, right middle finger: Secondary | ICD-10-CM

## 2020-05-29 DIAGNOSIS — M7541 Impingement syndrome of right shoulder: Secondary | ICD-10-CM

## 2020-05-29 MED ORDER — LIDOCAINE HCL 1 % IJ SOLN
0.5000 mL | INTRAMUSCULAR | Status: AC | PRN
  Administered 2020-05-29: .5 mL

## 2020-05-29 MED ORDER — METHYLPREDNISOLONE ACETATE 40 MG/ML IJ SUSP
20.0000 mg | INTRAMUSCULAR | Status: AC | PRN
  Administered 2020-05-29: 20 mg

## 2020-05-29 NOTE — Progress Notes (Addendum)
Office Visit Note   Patient: Jacqueline Sparks           Date of Birth: July 23, 1961           MRN: [id number] Visit Date: 05/29/2020              Requested by: Mar Daring, PA-C [address] [place],  [place] [address] PCP: Mar Daring, PA-C  Chief Complaint  Patient presents with   Right Shoulder - Follow-up    MRI review right shoulder       HPI: Patient is a 59 year old woman who presents in follow-up status post MRI scan right shoulder.  Patient has had chronic shoulder problems secondary to her repetitive use at work.  She states she has been working for approximately 27 years for the [employer].  She has undergone shoulder arthroscopy as well as injections with minimal relief.  Patient also complains of triggering of the right long finger.  She states the finger gets stuck in flexion.  Assessment & Plan: Visit Diagnoses:  1. Trigger finger, right middle finger   2. Impingement syndrome of right shoulder   3. Primary osteoarthritis, right shoulder     Plan: Discussed with the patient with her shoulders current condition she is permanently disabled from using her right shoulder at work.  Discussed her options are disability or proceeding with a total shoulder arthroplasty.  A1 pulley was injected right long finger.  Discussed the possibility of a A1 pulleys release if necessary.  Follow-Up Instructions: Return if symptoms worsen or fail to improve.   Ortho Exam  Patient is alert, oriented, no adenopathy, well-dressed, normal affect, normal respiratory effort. Examination patient has active abduction flexion to 90 degrees there is crepitation with range of motion pain with Neer and Hawkins impingement test pain with a drop arm test only about 45 degrees of internal and external rotation.  Review of her MRI scan shows marked tendinosis of the supraspinatus with high-grade partial tearing involving the majority of the tendon thickness there is also  infraspinatus tendinitis with moderate to severe glenohumeral osteoarthritis.  Patient's biceps tendon is chronically torn and retracted.  Examination patient has palpable triggering at the A1 pulley right long finger.  Imaging: No results found. No images are attached to the encounter.  Labs: Lab Results  Component Value Date   HGBA1C 7.3 (A) 03/15/2020   HGBA1C 7.7 (A) 09/12/2019   HGBA1C 7.2 (H) 03/03/2019   LABURIC 5.3 04/10/2017     Lab Results  Component Value Date   ALBUMIN 4.4 03/26/2020   ALBUMIN 4.2 03/03/2019   ALBUMIN 4.1 11/24/2016   LABURIC 5.3 04/10/2017    No results found for: MG Lab Results  Component Value Date   VD25OH 42.8 03/26/2020    No results found for: PREALBUMIN CBC EXTENDED Latest Ref Rng & Units 03/26/2020 03/03/2019 11/24/2016  WBC 3.4 - 10.8 x10E3/uL 7.5 7.5 8.6  RBC 3.77 - 5.28 x10E6/uL 5.06 4.62 4.95  HGB 11.1 - 15.9 g/dL 13.5 12.5 13.8  HCT 34.0 - 46.6 % 41.8 37.4 41.4  PLT 150 - 450 x10E3/uL 308 305 375  NEUTROABS 1 - 7 x10E3/uL 3.2 3.4 3.9  LYMPHSABS 0 - 3 x10E3/uL 3.7(H) 3.2(H) 4.1(H)     Body mass index is 29.76 kg/m.  Orders:  Orders Placed This Encounter  Procedures   Hand/UE Inj: R long A1   Meds ordered this encounter  Medications   lidocaine (XYLOCAINE) 1 % (with pres) injection 0.5  mL   methylPREDNISolone acetate (DEPO-MEDROL) injection 20 mg     Procedures: Hand/UE Inj: R long A1 for trigger finger on 05/29/2020 2:18 PM Indications: diagnostic and therapeutic Details: 22 G needle Medications: 0.5 mL lidocaine 1 %; 20 mg methylPREDNISolone acetate 40 MG/ML Outcome: tolerated well, no immediate complications Procedure, treatment alternatives, risks and benefits explained, specific risks discussed. Consent was given by the patient. Immediately prior to procedure a time out was called to verify the correct patient, procedure, equipment, support staff and site/side marked as required. Patient was prepped and draped in  the usual sterile fashion.      Clinical Data: No additional findings.  ROS:  All other systems negative, except as noted in the HPI. Review of Systems  Objective: Vital Signs: Ht 5\' 3"  (1.6 m)    Wt 168 lb (76.2 kg)    BMI 29.76 kg/m   Specialty Comments:  No specialty comments available.  PMFS History: Patient Active Problem List   Diagnosis Date Noted   Psoriasis 06/01/2017   Polyp of sigmoid colon    Benign neoplasm of descending colon    Impingement syndrome of left shoulder 10/06/2016   Impingement syndrome of right shoulder 10/06/2016   BP (high blood pressure) 11/12/2015   Abnormal abdominal MRI 11/09/2015   Abnormal ECG 11/09/2015   Anxiety 11/09/2015   Obesity 01/30/2014   Smoking 01/30/2014   Avitaminosis D 05/03/2010   Obstructive apnea 06/09/2007   Hypercholesteremia 06/09/2007   Compulsive tobacco user syndrome 06/09/2007   Renal tubular disorder 05/19/2007   Diabetes mellitus, type 2 ([place]) 06/04/2006   Past Medical History:  Diagnosis Date   Anxiety    Bronchitis    Diabetes mellitus without complication (HCC)    GERD (gastroesophageal reflux disease)    Hyperlipidemia    Sleep apnea     Family History  Problem Relation Age of Onset   Hyperlipidemia Mother    Hypertension Mother    Diabetes Mother    Diabetes Father    Hyperlipidemia Father    Hypertension Father     Past Surgical History:  Procedure Laterality Date   CARPAL TUNNEL RELEASE  2001   as staated this was the left side, right side was completed in 2003   COLONOSCOPY WITH PROPOFOL N/A 02/17/2017   Procedure: COLONOSCOPY WITH PROPOFOL;  Surgeon: Lucilla Lame, MD;  Location: [facility] ENDOSCOPY;  Service: Endoscopy;  Laterality: N/A;   DILATION AND CURETTAGE OF UTERUS  2008   as stated uterine polyps   ROTATOR CUFF REPAIR Right 3004 and 2010   TUBAL LIGATION  1999   Social History   Occupational History   Not on file  Tobacco Use   Smoking  status: Current Every Day Smoker    Packs/day: 1.00    Years: 30.00    Pack years: 30.00    Types: Cigarettes   Smokeless tobacco: Never Used  Vaping Use   Vaping Use: Never used  Substance and Sexual Activity   Alcohol use: No   Drug use: No   Sexual activity: Never

## 2020-06-04 ENCOUNTER — Telehealth: Payer: Self-pay

## 2020-06-04 NOTE — Telephone Encounter (Signed)
Patient came in  Patients stated that her employer is requesting detailed work note stating she can use her arm and that she is okay to work  Call back number [phone]

## 2020-06-04 NOTE — Telephone Encounter (Signed)
Patient requested work note to be faxed to her   Fax number is [phone]

## 2020-06-05 ENCOUNTER — Telehealth: Payer: Self-pay | Admitting: Orthopedic Surgery

## 2020-06-05 NOTE — Telephone Encounter (Signed)
Letter was written and sent to providing fax number per patient's concerns pertaining to her right shoulder.

## 2020-06-05 NOTE — Telephone Encounter (Signed)
I havent seen her so I cant really change a note

## 2020-06-05 NOTE — Telephone Encounter (Signed)
Awaiting clarification on note.

## 2020-06-05 NOTE — Telephone Encounter (Signed)
Patient called advised her employer need a more detailed note concerning her right arm. Patient said her employer is questioning her range of motion for of her right arm.  The employers  fax# is [phone]   The ph# is [phone] Patient said the note need to state she can not use her right arm at all. The number to contact patient is [phone]

## 2020-06-05 NOTE — Telephone Encounter (Signed)
Patient last office notes sent to number listed [phone].

## 2020-06-05 NOTE — Telephone Encounter (Signed)
Please advise. Patient needs certain details in her notes or letter before faxing. Thank you

## 2020-06-07 ENCOUNTER — Other Ambulatory Visit: Payer: Self-pay | Admitting: Physician Assistant

## 2020-06-07 DIAGNOSIS — E119 Type 2 diabetes mellitus without complications: Secondary | ICD-10-CM

## 2020-06-14 ENCOUNTER — Telehealth: Payer: Self-pay | Admitting: Orthopedic Surgery

## 2020-06-14 NOTE — Telephone Encounter (Signed)
Patient called, needs another copy of records to take to [facility] meeting tomorrow. Faxed to her per her request. AR on file

## 2020-06-20 ENCOUNTER — Telehealth: Payer: Self-pay | Admitting: Orthopedic Surgery

## 2020-06-20 NOTE — Telephone Encounter (Signed)
Received vm from pt. Stated that her 04/03/20 dictation is incorrect. She feels that it reads that the "reaching into her back pocket" sounds like the cause of her injury. She states that is not the case. It should be amended. She states the injury is WC and her reaching into her pocket just caused more pain. She wants it amended to where it does not sound like her reaching into her pocket actually caused the injury. pts callback [phone]

## 2020-06-20 NOTE — Telephone Encounter (Signed)
I called pt and advised per Junie Panning that the dictation from the visit 03/2020 the action that was described to her was what was dictated in the note and that the motion of reaching into her back pocket increased the pain that she was having with a chronic injury. We can not make changes to the note as this is what was said in the office visit. Pt said that she and her lawyer had questioned it because it sounded like it was the key factor of the injury.

## 2020-06-28 ENCOUNTER — Other Ambulatory Visit: Payer: Self-pay

## 2020-06-28 ENCOUNTER — Ambulatory Visit: Payer: Federal, State, Local not specified - PPO | Admitting: Physician Assistant

## 2020-06-28 ENCOUNTER — Encounter: Payer: Self-pay | Admitting: Physician Assistant

## 2020-06-28 VITALS — BP 129/79 | HR 80 | Temp 99.0°F | Resp 16 | Wt 155.0 lb

## 2020-06-28 DIAGNOSIS — E119 Type 2 diabetes mellitus without complications: Secondary | ICD-10-CM

## 2020-06-28 LAB — POCT GLYCOSYLATED HEMOGLOBIN (HGB A1C)
Est. average glucose Bld gHb Est-mCnc: 154
Hemoglobin A1C: 7 % — AB (ref 4.0–5.6)

## 2020-06-28 NOTE — Progress Notes (Signed)
I,Roshena L Chambers,acting as a scribe for [employer], PA-C.,have documented all relevant documentation on the behalf of Mar Daring, PA-C,as directed by  Mar Daring, PA-C while in the presence of Mar Daring, [place].  Established patient visit   Patient: Jacqueline Sparks   DOB: 06/19/1961   59 y.o. Female  MRN: [id number] Visit Date: 06/28/2020  Today's healthcare provider: Mar Daring, PA-C   No chief complaint on file.  Subjective    HPI  Diabetes Mellitus Type II, Follow-up  Lab Results  Component Value Date   HGBA1C 7.0 (A) 06/28/2020   HGBA1C 7.3 (A) 03/15/2020   HGBA1C 7.7 (A) 09/12/2019   Wt Readings from Last 3 Encounters:  06/28/20 155 lb (70.3 kg)  05/29/20 168 lb (76.2 kg)  04/03/20 168 lb (76.2 kg)   Last seen for diabetes 3 months ago.  Management since then includes continuing dietary limitations and glipizide XR 10mg , Januvia 100mg , pioglitazone 45mg  and Trulicity 0.75mg . She reports good compliance with treatment. She is not having side effects.  Symptoms: Yes fatigue No foot ulcerations  No appetite changes No nausea  No paresthesia of the feet  No polydipsia  No polyuria Yes visual disturbances   No vomiting     Home blood sugar records: blood sugars are not checked  Episodes of hypoglycemia? No    Current insulin regiment: none Most Recent Eye Exam: 10/24/2019 Current exercise: none Current diet habits: well balanced  Pertinent Labs: Lab Results  Component Value Date   CHOL 155 03/26/2020   HDL 30 (L) 03/26/2020   LDLCALC 91 03/26/2020   TRIG 198 (H) 03/26/2020   CHOLHDL 5.3 (H) 03/03/2019   Lab Results  Component Value Date   NA 143 03/26/2020   K 4.3 03/26/2020   CREATININE 0.76 03/26/2020   GFRNONAA 87 03/26/2020   GFRAA 101 03/26/2020   GLUCOSE 101 (H) 03/26/2020     ---------------------------------------------------------------------------------------------------  Patient Active  Problem List   Diagnosis Date Noted  . Psoriasis 06/01/2017  . Polyp of sigmoid colon   . Benign neoplasm of descending colon   . Impingement syndrome of left shoulder 10/06/2016  . Impingement syndrome of right shoulder 10/06/2016  . BP (high blood pressure) 11/12/2015  . Abnormal abdominal MRI 11/09/2015  . Abnormal ECG 11/09/2015  . Anxiety 11/09/2015  . Obesity 01/30/2014  . Smoking 01/30/2014  . Avitaminosis D 05/03/2010  . Obstructive apnea 06/09/2007  . Hypercholesteremia 06/09/2007  . Compulsive tobacco user syndrome 06/09/2007  . Renal tubular disorder 05/19/2007  . Diabetes mellitus, type 2 ([place]) 06/04/2006   Past Medical History:  Diagnosis Date  . Anxiety   . Bronchitis   . Diabetes mellitus without complication ([place])   . GERD (gastroesophageal reflux disease)   . Hyperlipidemia   . Sleep apnea        Medications: Outpatient Medications Prior to Visit  Medication Sig  . ALPRAZolam (XANAX) 0.5 MG tablet TAKE 1/2 TO 1 (ONE-HALF TO ONE) TABLET BY MOUTH TWICE DAILY AS NEEDED  . atorvastatin (LIPITOR) 20 MG tablet Take 1 tablet by mouth once daily  . clobetasol ointment (TEMOVATE) 9.93 % Apply 1 application topically 2 (two) times daily.  Marland Kitchen glipiZIDE (GLUCOTROL) 10 MG tablet TAKE 1 TABLET BY MOUTH TWICE DAILY 30 MINUTES BEFORE A MEAL.  Marland Kitchen JANUVIA 100 MG tablet Take 1 tablet by mouth once daily  . NON FORMULARY CPAP  . ONE TOUCH ULTRA TEST test strip USE ONE STRIP TO  CHECK GLUCOSE ONCE DAILY  . pioglitazone (ACTOS) 45 MG tablet Take 1 tablet by mouth once daily  . TRULICITY 7.61 PJ/0.9TO SOPN INJECT 1 SYRINGEFUL SUBCUTANEOUSLY ONCE A WEEK   No facility-administered medications prior to visit.    Review of Systems  Constitutional: Negative for appetite change, chills, fatigue and fever.  Respiratory: Negative for chest tightness and shortness of breath.   Cardiovascular: Negative for chest pain and palpitations.  Gastrointestinal: Negative for abdominal pain,  nausea and vomiting.  Endocrine: Negative for polydipsia, polyphagia and polyuria.  Neurological: Negative for dizziness and weakness.    Last CBC Lab Results  Component Value Date   WBC 7.5 03/26/2020   HGB 13.5 03/26/2020   HCT 41.8 03/26/2020   MCV 83 03/26/2020   MCH 26.7 03/26/2020   RDW 14.4 03/26/2020   PLT 308 67/09/4579   Last metabolic panel Lab Results  Component Value Date   GLUCOSE 101 (H) 03/26/2020   NA 143 03/26/2020   K 4.3 03/26/2020   CL 105 03/26/2020   CO2 26 03/26/2020   BUN 5 (L) 03/26/2020   CREATININE 0.76 03/26/2020   GFRNONAA 87 03/26/2020   GFRAA 101 03/26/2020   CALCIUM 9.4 03/26/2020   PROT 7.4 03/26/2020   ALBUMIN 4.4 03/26/2020   LABGLOB 3.0 03/26/2020   AGRATIO 1.5 03/26/2020   BILITOT <0.2 03/26/2020   ALKPHOS 88 03/26/2020   AST 21 03/26/2020   ALT 15 03/26/2020      Objective    BP 129/79 (BP Location: Left Arm, Patient Position: Sitting)   Pulse 80   Temp 99 F (37.2 C) (Oral)   Resp 16   Wt 155 lb (70.3 kg)   BMI 27.46 kg/m  BP Readings from Last 3 Encounters:  06/28/20 129/79  03/15/20 105/65  09/12/19 119/77   Wt Readings from Last 3 Encounters:  06/28/20 155 lb (70.3 kg)  05/29/20 168 lb (76.2 kg)  04/03/20 168 lb (76.2 kg)      Physical Exam Vitals reviewed.  Constitutional:      General: She is not in acute distress.    Appearance: Normal appearance. She is well-developed. She is not ill-appearing or diaphoretic.  Cardiovascular:     Rate and Rhythm: Normal rate and regular rhythm.     Heart sounds: Normal heart sounds. No murmur heard.  No friction rub. No gallop.   Pulmonary:     Effort: Pulmonary effort is normal. No respiratory distress.     Breath sounds: Normal breath sounds. No wheezing or rales.  Musculoskeletal:     Cervical back: Normal range of motion and neck supple.  Neurological:     Mental Status: She is alert.       Results for orders placed or performed in visit on 06/28/20    POCT HgB A1C  Result Value Ref Range   Hemoglobin A1C 7.0 (A) 4.0 - 5.6 %   Est. average glucose Bld gHb Est-mCnc 154     Assessment & Plan     1. Type 2 diabetes mellitus without complication, without long-term current use of insulin (HCC) A1c continues to improve and is down to 7.0. Continue Glipizide 10mg  BID, Januvia 100gm daily, pioglitazone 45mg  daily, and trulicity 0.75mg  SQ weekly. F/U in 3 months.  No follow-ups on file.      Reynolds Bowl, PA-C, have reviewed all documentation for this visit. The documentation on 07/05/20 for the exam, diagnosis, procedures, and orders are all accurate and complete.   Anderson Malta  Dorothy Puffer, Hershal Coria  [facility] [phone] (phone) [phone] (fax)  [place]

## 2020-07-05 ENCOUNTER — Encounter: Payer: Self-pay | Admitting: Physician Assistant

## 2020-07-06 DIAGNOSIS — F330 Major depressive disorder, recurrent, mild: Secondary | ICD-10-CM | POA: Diagnosis not present

## 2020-07-06 DIAGNOSIS — F411 Generalized anxiety disorder: Secondary | ICD-10-CM | POA: Diagnosis not present

## 2020-07-06 DIAGNOSIS — F331 Major depressive disorder, recurrent, moderate: Secondary | ICD-10-CM | POA: Diagnosis not present

## 2020-07-11 ENCOUNTER — Encounter: Payer: Self-pay | Admitting: Orthopedic Surgery

## 2020-07-12 DIAGNOSIS — F330 Major depressive disorder, recurrent, mild: Secondary | ICD-10-CM | POA: Diagnosis not present

## 2020-07-12 DIAGNOSIS — F411 Generalized anxiety disorder: Secondary | ICD-10-CM | POA: Diagnosis not present

## 2020-07-12 DIAGNOSIS — F331 Major depressive disorder, recurrent, moderate: Secondary | ICD-10-CM | POA: Diagnosis not present

## 2020-07-19 DIAGNOSIS — F411 Generalized anxiety disorder: Secondary | ICD-10-CM | POA: Diagnosis not present

## 2020-07-19 DIAGNOSIS — F330 Major depressive disorder, recurrent, mild: Secondary | ICD-10-CM | POA: Diagnosis not present

## 2020-07-19 DIAGNOSIS — F331 Major depressive disorder, recurrent, moderate: Secondary | ICD-10-CM | POA: Diagnosis not present

## 2020-07-20 ENCOUNTER — Other Ambulatory Visit: Payer: Self-pay | Admitting: Physician Assistant

## 2020-07-20 DIAGNOSIS — F419 Anxiety disorder, unspecified: Secondary | ICD-10-CM

## 2020-07-20 NOTE — Telephone Encounter (Signed)
Requested medication (s) are due for refill today: yes  Requested medication (s) are on the active medication list: yes  Last refill:  12/05/19  Future visit scheduled: yes  Notes to clinic:  not delegated    Requested Prescriptions  Pending Prescriptions Disp Refills   ALPRAZolam (XANAX) 0.5 MG tablet [Pharmacy Med Name: ALPRAZolam 0.5 MG Oral Tablet] 60 tablet 0    Sig: TAKE 1/2 TO 1 (ONE-HALF TO ONE) TABLET BY MOUTH TWICE DAILY AS NEEDED      Not Delegated - Psychiatry:  Anxiolytics/Hypnotics Failed - 07/20/2020  8:52 AM      Failed - This refill cannot be delegated      Failed - Urine Drug Screen completed in last 360 days.      Passed - Valid encounter within last 6 months    Recent Outpatient Visits           3 weeks ago Type 2 diabetes mellitus without complication, without long-term current use of insulin [facility])   [facility] [place], [place], [place]   4 months ago Type 2 diabetes mellitus without complication, without long-term current use of insulin [facility])   [place], [place], [place]   10 months ago Type 2 diabetes mellitus without complication, without long-term current use of insulin [facility])   [place], Clearnce Sorrel, [place]   1 year ago Encounter for annual physical exam   [place], Clearnce Sor[place], [place]   1 year ago Pharyngitis, unspecified etiology   [facility] [place], [place], [place]       Future Appointments             In 2 months [place], Clearnce Sorrel, PA-C [employer], [place]

## 2020-07-26 DIAGNOSIS — F330 Major depressive disorder, recurrent, mild: Secondary | ICD-10-CM | POA: Diagnosis not present

## 2020-07-26 DIAGNOSIS — F331 Major depressive disorder, recurrent, moderate: Secondary | ICD-10-CM | POA: Diagnosis not present

## 2020-07-26 DIAGNOSIS — F411 Generalized anxiety disorder: Secondary | ICD-10-CM | POA: Diagnosis not present

## 2020-08-02 DIAGNOSIS — F411 Generalized anxiety disorder: Secondary | ICD-10-CM | POA: Diagnosis not present

## 2020-08-02 DIAGNOSIS — F331 Major depressive disorder, recurrent, moderate: Secondary | ICD-10-CM | POA: Diagnosis not present

## 2020-08-02 DIAGNOSIS — F330 Major depressive disorder, recurrent, mild: Secondary | ICD-10-CM | POA: Diagnosis not present

## 2020-08-09 DIAGNOSIS — F411 Generalized anxiety disorder: Secondary | ICD-10-CM | POA: Diagnosis not present

## 2020-08-09 DIAGNOSIS — F330 Major depressive disorder, recurrent, mild: Secondary | ICD-10-CM | POA: Diagnosis not present

## 2020-08-09 DIAGNOSIS — F331 Major depressive disorder, recurrent, moderate: Secondary | ICD-10-CM | POA: Diagnosis not present

## 2020-08-14 ENCOUNTER — Ambulatory Visit: Payer: Federal, State, Local not specified - PPO | Admitting: Family Medicine

## 2020-08-14 ENCOUNTER — Encounter: Payer: Self-pay | Admitting: Family Medicine

## 2020-08-14 ENCOUNTER — Ambulatory Visit: Payer: Self-pay | Admitting: *Deleted

## 2020-08-14 ENCOUNTER — Other Ambulatory Visit: Payer: Self-pay

## 2020-08-14 VITALS — BP 111/62 | HR 82 | Temp 98.9°F | Wt 161.0 lb

## 2020-08-14 DIAGNOSIS — L409 Psoriasis, unspecified: Secondary | ICD-10-CM | POA: Diagnosis not present

## 2020-08-14 DIAGNOSIS — L241 Irritant contact dermatitis due to oils and greases: Secondary | ICD-10-CM | POA: Diagnosis not present

## 2020-08-14 MED ORDER — PREDNISONE 10 MG PO TABS: ORAL_TABLET | ORAL | 0 refills | Status: DC

## 2020-08-14 MED ORDER — CLOBETASOL PROPIONATE 0.05 % EX OINT: 1.0000 "application " | TOPICAL_OINTMENT | Freq: Two times a day (BID) | CUTANEOUS | 0 refills | Status: DC

## 2020-08-14 NOTE — Progress Notes (Signed)
Acute Office Visit  Subjective:    Patient ID: Jacqueline Sparks, female    DOB: 1961-01-03, 59 y.o.   MRN: [id number]  No chief complaint on file.   HPI Patient is in today for evaluation of a rash on her face that has been present for 1 week.  She states that it itches and burns.  She does report opening and using wood stain prior to the breakout. She has history of psoriasis but never on her face.   Past Medical History:  Diagnosis Date  . Anxiety   . Bronchitis   . Diabetes mellitus without complication ([place])   . GERD (gastroesophageal reflux disease)   . Hyperlipidemia   . Sleep apnea     Past Surgical History:  Procedure Laterality Date  . CARPAL TUNNEL RELEASE  2001   as staated this was the left side, right side was completed in 2003  . COLONOSCOPY WITH PROPOFOL N/A 02/17/2017   Procedure: COLONOSCOPY WITH PROPOFOL;  Surgeon: Lucilla Lame, MD;  Location: [facility] ENDOSCOPY;  Service: Endoscopy;  Laterality: N/A;  . DILATION AND CURETTAGE OF UTERUS  2008   as stated uterine polyps  . ROTATOR CUFF REPAIR Right 3004 and 2010  . TUBAL LIGATION  1999    Family History  Problem Relation Age of Onset  . Hyperlipidemia Mother   . Hypertension Mother   . Diabetes Mother   . Diabetes Father   . Hyperlipidemia Father   . Hypertension Father     Social History   Socioeconomic History  . Marital status: Single    Spouse name: Not on file  . Number of children: Not on file  . Years of education: Not on file  . Highest education level: Not on file  Occupational History  . Not on file  Tobacco Use  . Smoking status: Current Every Day Smoker    Packs/day: 1.00    Years: 30.00    Pack years: 30.00    Types: Cigarettes  . Smokeless tobacco: Never Used  Vaping Use  . Vaping Use: Never used  Substance and Sexual Activity  . Alcohol use: Yes    Comment: seldom use  . Drug use: No  . Sexual activity: Never  Other Topics Concern  . Not on file  Social History Narrative    . Not on file   Social Determinants of Health   Financial Resource Strain:   . Difficulty of Paying Living Expenses: Not on file  Food Insecurity:   . Worried About Charity fundraiser in the Last Year: Not on file  . Ran Out of Food in the Last Year: Not on file  Transportation Needs:   . Lack of Transportation (Medical): Not on file  . Lack of Transportation (Non-Medical): Not on file  Physical Activity:   . Days of Exercise per Week: Not on file  . Minutes of Exercise per Session: Not on file  Stress:   . Feeling of Stress : Not on file  Social Connections:   . Frequency of Communication with Friends and Family: Not on file  . Frequency of Social Gatherings with Friends and Family: Not on file  . Attends Religious Services: Not on file  . Active Member of Clubs or Organizations: Not on file  . Attends Archivist Meetings: Not on file  . Marital Status: Not on file  Intimate Partner Violence:   . Fear of Current or Ex-Partner: Not on file  . Emotionally Abused: Not on  file  . Physically Abused: Not on file  . Sexually Abused: Not on file    Outpatient Medications Prior to Visit  Medication Sig Dispense Refill  . ALPRAZolam (XANAX) 0.5 MG tablet TAKE 1/2 TO 1 (ONE-HALF TO ONE) TABLET BY MOUTH TWICE DAILY AS NEEDED 60 tablet 0  . atorvastatin (LIPITOR) 20 MG tablet Take 1 tablet by mouth once daily 90 tablet 1  . clobetasol ointment (TEMOVATE) 0.26 % Apply 1 application topically 2 (two) times daily. 30 g 0  . glipiZIDE (GLUCOTROL) 10 MG tablet TAKE 1 TABLET BY MOUTH TWICE DAILY 30 MINUTES BEFORE A MEAL. 180 tablet 0  . JANUVIA 100 MG tablet Take 1 tablet by mouth once daily 90 tablet 0  . NON FORMULARY CPAP    . ONE TOUCH ULTRA TEST test strip USE ONE STRIP TO CHECK GLUCOSE ONCE DAILY 100 each 1  . pioglitazone (ACTOS) 45 MG tablet Take 1 tablet by mouth once daily 90 tablet 0  . TRULICITY 3.78 HY/8.5OY SOPN INJECT 1 SYRINGEFUL SUBCUTANEOUSLY ONCE A WEEK 12 mL 0    No facility-administered medications prior to visit.    No Known Allergies  Review of Systems  Constitutional: Negative for fever.  Skin: Positive for color change and rash.       Face had been swollen and more red than it is now       Objective:    Physical Exam Constitutional:      General: She is not in acute distress.    Appearance: She is well-developed.  HENT:     Head: Normocephalic and atraumatic.     Right Ear: Hearing normal.     Left Ear: Hearing normal.     Nose: Nose normal.  Eyes:     General: Lids are normal. No scleral icterus.       Right eye: No discharge.        Left eye: No discharge.     Conjunctiva/sclera: Conjunctivae normal.  Pulmonary:     Effort: Pulmonary effort is normal. No respiratory distress.  Musculoskeletal:        General: Normal range of motion.  Skin:    Findings: Rash present. No lesion.     Comments: Ashy pruritic reddened rash on cheeks. No blisters or open sores on face. Thicker rash on hand and feet with some scabs and thick scales.  Neurological:     Mental Status: She is alert and oriented to person, place, and time.  Psychiatric:        Speech: Speech normal.        Behavior: Behavior normal.        Thought Content: Thought content normal.     BP 111/62 (BP Location: Right Arm, Patient Position: Sitting, Cuff Size: Normal)   Pulse 82   Temp 98.9 F (37.2 C) (Oral)   Wt 161 lb (73 kg)   SpO2 100%   BMI 28.52 kg/m  Wt Readings from Last 3 Encounters:  08/14/20 161 lb (73 kg)  06/28/20 155 lb (70.3 kg)  05/29/20 168 lb (76.2 kg)    Health Maintenance Due  Topic Date Due  . COVID-19 Vaccine (1) Never done  . TETANUS/TDAP  Never done  . MAMMOGRAM  Never done  . FOOT EXAM  03/02/2020    There are no preventive care reminders to display for this patient.   Lab Results  Component Value Date   TSH 0.971 03/26/2020   Lab Results  Component Value Date  WBC 7.5 03/26/2020   HGB 13.5 03/26/2020   HCT  41.8 03/26/2020   MCV 83 03/26/2020   PLT 308 03/26/2020   Lab Results  Component Value Date   NA 143 03/26/2020   K 4.3 03/26/2020   CO2 26 03/26/2020   GLUCOSE 101 (H) 03/26/2020   BUN 5 (L) 03/26/2020   CREATININE 0.76 03/26/2020   BILITOT <0.2 03/26/2020   ALKPHOS 88 03/26/2020   AST 21 03/26/2020   ALT 15 03/26/2020   PROT 7.4 03/26/2020   ALBUMIN 4.4 03/26/2020   CALCIUM 9.4 03/26/2020   Lab Results  Component Value Date   CHOL 155 03/26/2020   Lab Results  Component Value Date   HDL 30 (L) 03/26/2020   Lab Results  Component Value Date   LDLCALC 91 03/26/2020   Lab Results  Component Value Date   TRIG 198 (H) 03/26/2020   Lab Results  Component Value Date   CHOLHDL 5.3 (H) 03/03/2019   Lab Results  Component Value Date   HGBA1C 7.0 (A) 06/28/2020       Assessment & Plan:   1. Irritant contact dermatitis due to oils Onset after splashing a wood stain on her face last week. Possible contact dermatitis versus flare of psoriasis on face. Will treat with prednisone taper. May need recheck with dermatologist if no better in a week. - predniSONE (DELTASONE) 10 MG tablet; Taper down by 1 tablet by mouth starting at 6 day 1, 5 day 2, 4 day 3, 3 day 4, 2 day 5 and 1 day 6. Divide dosage among meals and bedtime each day.  Dispense: 21 tablet; Refill: 0  2. Psoriasis History of psoriasis of hands and feet. Needs refill of Temovate. - clobetasol ointment (TEMOVATE) 0.05 %; Apply 1 application topically 2 (two) times daily.  Dispense: 30 g; Refill: 0    No orders of the defined types were placed in this encounter.  Andres Shad, PA, have reviewed all documentation for this visit. The documentation on 08/14/20 for the exam, diagnosis, procedures, and orders are all accurate and complete.   Juluis Mire, CMA

## 2020-08-14 NOTE — Telephone Encounter (Signed)
Patient is calling to report she splashed herself a couple days ago with chemical she was using in pressure washer- she states she has rash and swelling on face now- patient thinks she is having a chemical reaction/irritation. She states she did wash her face after this splash.  Reason for Disposition  [1] Mild facial swelling (puffiness) AND [2] persists > 3 days  Answer Assessment - Initial Assessment Questions 1. ONSET: "When did the swelling start?" (e.g., minutes, hours, days)     2 days ago 2. LOCATION: "What part of the face is swollen?"     R side of face- lip and  Under eye 3. SEVERITY: "How swollen is it?"     2-3 on scale of 10 4. ITCHING: "Is there any itching?" If Yes, ask: "How much?"   (Scale 1-10; mild, moderate or severe)     Yes- severe 5. PAIN: "Is the swelling painful to touch?" If Yes, ask: "How painful is it?"   (Scale 1-10; mild, moderate or severe)     No pain 6. FEVER: "Do you have a fever?" If Yes, ask: "What is it, how was it measured, and when did it start?"      no 7. CAUSE: "What do you think is causing the face swelling?"     Chemical exposure 8. RECURRENT SYMPTOM: "Have you had face swelling before?" If Yes, ask: "When was the last time?" "What happened that time?"     no 9. OTHER SYMPTOMS: "Do you have any other symptoms?" (e.g., toothache, leg swelling)     Rash around mouth and cheeks- dry 10. PREGNANCY: "Is there any chance you are pregnant?" "When was your last menstrual period?"       n/a  Protocols used: [facility]

## 2020-08-16 ENCOUNTER — Telehealth: Payer: Self-pay

## 2020-08-16 NOTE — Telephone Encounter (Signed)
Patient called she is wondering if her paperwork from her lawyer has been filled out. Call back:[phone]

## 2020-08-17 DIAGNOSIS — F330 Major depressive disorder, recurrent, mild: Secondary | ICD-10-CM | POA: Diagnosis not present

## 2020-08-17 DIAGNOSIS — F411 Generalized anxiety disorder: Secondary | ICD-10-CM | POA: Diagnosis not present

## 2020-08-17 NOTE — Telephone Encounter (Signed)
Are you going to write the note for this pt's attorney? Letter at my desk.

## 2020-08-23 ENCOUNTER — Encounter: Payer: Self-pay | Admitting: Orthopedic Surgery

## 2020-08-23 ENCOUNTER — Ambulatory Visit: Payer: Federal, State, Local not specified - PPO | Admitting: Orthopedic Surgery

## 2020-08-23 VITALS — Ht 63.0 in | Wt 161.0 lb

## 2020-08-23 DIAGNOSIS — F411 Generalized anxiety disorder: Secondary | ICD-10-CM | POA: Diagnosis not present

## 2020-08-23 DIAGNOSIS — M7542 Impingement syndrome of left shoulder: Secondary | ICD-10-CM | POA: Diagnosis not present

## 2020-08-23 DIAGNOSIS — F330 Major depressive disorder, recurrent, mild: Secondary | ICD-10-CM | POA: Diagnosis not present

## 2020-08-27 ENCOUNTER — Telehealth: Payer: Self-pay | Admitting: Orthopedic Surgery

## 2020-08-27 ENCOUNTER — Encounter: Payer: Self-pay | Admitting: Orthopedic Surgery

## 2020-08-27 NOTE — Progress Notes (Signed)
Office Visit Note   Patient: Jacqueline Sparks           Date of Birth: April 09, 1961           MRN: [id number] Visit Date: 08/23/2020              Requested by: Mar Daring, PA-C [place],  [place] [id number] PCP: Mar Daring, PA-C  Chief Complaint  Patient presents with  . Left Shoulder - Follow-up      HPI: Patient is a 59 year old woman who presents complaining of pain in the left shoulder.  Patient has had previous injections.  Patient states she had an acute pain with bringing a cup to her mouth and reaching in front of herself.  Also pain with lying on her side.  Assessment & Plan: Visit Diagnoses:  1. Impingement syndrome of left shoulder     Plan: Due to failure of conservative treatment and previous injections will order an MRI scan of the left shoulder to further evaluate rotator cuff pathology.  Follow-Up Instructions: Return if symptoms worsen or fail to improve.   Ortho Exam  Patient is alert, oriented, no adenopathy, well-dressed, normal affect, normal respiratory effort. Examination patient has active abduction flexion to 70 degrees she has pain with range of motion of the left shoulder she has pain with Neer and Hawkins impingement test pain with a drop arm test.  Imaging: No results found. No images are attached to the encounter.  Labs: Lab Results  Component Value Date   HGBA1C 7.0 (A) 06/28/2020   HGBA1C 7.3 (A) 03/15/2020   HGBA1C 7.7 (A) 09/12/2019   LABURIC 5.3 04/10/2017     Lab Results  Component Value Date   ALBUMIN 4.4 03/26/2020   ALBUMIN 4.2 03/03/2019   ALBUMIN 4.1 11/24/2016   LABURIC 5.3 04/10/2017    No results found for: MG Lab Results  Component Value Date   VD25OH 42.8 03/26/2020    No results found for: PREALBUMIN CBC EXTENDED Latest Ref Rng & Units 03/26/2020 03/03/2019 11/24/2016  WBC 3.4 - 10.8 x10E3/uL 7.5 7.5 8.6  RBC 3.77 - 5.28 x10E6/uL 5.06 4.62 4.95  HGB 11.1 - 15.9 g/dL 13.5  12.5 13.8  HCT 34.0 - 46.6 % 41.8 37.4 41.4  PLT 150 - 450 x10E3/uL 308 305 375  NEUTROABS 1.40 - 7.00 x10E3/uL 3.2 3.4 3.9  LYMPHSABS 0 - 3 x10E3/uL 3.7(H) 3.2(H) 4.1(H)     Body mass index is 28.52 kg/m.  Orders:  No orders of the defined types were placed in this encounter.  No orders of the defined types were placed in this encounter.    Procedures: No procedures performed  Clinical Data: No additional findings.  ROS:  All other systems negative, except as noted in the HPI. Review of Systems  Objective: Vital Signs: Ht 5\' 3"  (1.6 m)   Wt 161 lb (73 kg)   BMI 28.52 kg/m   Specialty Comments:  No specialty comments available.  PMFS History: Patient Active Problem List   Diagnosis Date Noted  . Psoriasis 06/01/2017  . Polyp of sigmoid colon   . Benign neoplasm of descending colon   . Impingement syndrome of left shoulder 10/06/2016  . Impingement syndrome of right shoulder 10/06/2016  . BP (high blood pressure) 11/12/2015  . Abnormal abdominal MRI 11/09/2015  . Abnormal ECG 11/09/2015  . Anxiety 11/09/2015  . Obesity 01/30/2014  . Smoking 01/30/2014  . Avitaminosis D 05/03/2010  . Obstructive apnea 06/09/2007  .  Hypercholesteremia 06/09/2007  . Compulsive tobacco user syndrome 06/09/2007  . Renal tubular disorder 05/19/2007  . Diabetes mellitus, type 2 ([place]) 06/04/2006   Past Medical History:  Diagnosis Date  . Anxiety   . Bronchitis   . Diabetes mellitus without complication ([place])   . GERD (gastroesophageal reflux disease)   . Hyperlipidemia   . Sleep apnea     Family History  Problem Relation Age of Onset  . Hyperlipidemia Mother   . Hypertension Mother   . Diabetes Mother   . Diabetes Father   . Hyperlipidemia Father   . Hypertension Father     Past Surgical History:  Procedure Laterality Date  . CARPAL TUNNEL RELEASE  2001   as staated this was the left side, right side was completed in 2003  . COLONOSCOPY WITH PROPOFOL N/A 02/17/2017    Procedure: COLONOSCOPY WITH PROPOFOL;  Surgeon: Lucilla Lame, MD;  Location: [facility] ENDOSCOPY;  Service: Endoscopy;  Laterality: N/A;  . DILATION AND CURETTAGE OF UTERUS  2008   as stated uterine polyps  . ROTATOR CUFF REPAIR Right 3004 and 2010  . TUBAL LIGATION  1999   Social History   Occupational History  . Not on file  Tobacco Use  . Smoking status: Current Every Day Smoker    Packs/day: 1.00    Years: 30.00    Pack years: 30.00    Types: Cigarettes  . Smokeless tobacco: Never Used  Vaping Use  . Vaping Use: Never used  Substance and Sexual Activity  . Alcohol use: Yes    Comment: seldom use  . Drug use: No  . Sexual activity: Never

## 2020-08-27 NOTE — Telephone Encounter (Signed)
Patient called advised she need the note by 08/31/2020 for her Environmental manager. Patient also said her employer need medical documentation stating she is totally disabled. The number to contact patient is [phone]

## 2020-08-27 NOTE — Telephone Encounter (Signed)
You saw this pt last week and advised that you would dictate a letter for her attorney. You have the paperwork at your desk. This needs to be done by Friday or else her case is closed.

## 2020-08-30 ENCOUNTER — Other Ambulatory Visit: Payer: Self-pay | Admitting: Orthopedic Surgery

## 2020-08-30 ENCOUNTER — Encounter: Payer: Self-pay | Admitting: Orthopedic Surgery

## 2020-09-16 ENCOUNTER — Ambulatory Visit
Admission: RE | Admit: 2020-09-16 | Discharge: 2020-09-16 | Disposition: A | Discharge status: Home / Self Care | Payer: Federal, State, Local not specified - PPO | Source: Ambulatory Visit | Attending: Orthopedic Surgery | Admitting: Orthopedic Surgery

## 2020-09-16 ENCOUNTER — Other Ambulatory Visit: Payer: Self-pay

## 2020-09-16 DIAGNOSIS — M19012 Primary osteoarthritis, left shoulder: Secondary | ICD-10-CM | POA: Diagnosis not present

## 2020-09-16 DIAGNOSIS — M7542 Impingement syndrome of left shoulder: Secondary | ICD-10-CM

## 2020-09-16 DIAGNOSIS — M6588 Other synovitis and tenosynovitis, other site: Secondary | ICD-10-CM | POA: Diagnosis not present

## 2020-09-16 DIAGNOSIS — M7552 Bursitis of left shoulder: Secondary | ICD-10-CM | POA: Diagnosis not present

## 2020-09-16 DIAGNOSIS — R531 Weakness: Secondary | ICD-10-CM | POA: Diagnosis not present

## 2020-09-18 ENCOUNTER — Encounter: Payer: Self-pay | Admitting: Orthopedic Surgery

## 2020-09-18 ENCOUNTER — Telehealth: Payer: Self-pay | Admitting: Orthopedic Surgery

## 2020-09-18 ENCOUNTER — Ambulatory Visit: Payer: Federal, State, Local not specified - PPO | Admitting: Orthopedic Surgery

## 2020-09-18 DIAGNOSIS — M7541 Impingement syndrome of right shoulder: Secondary | ICD-10-CM | POA: Diagnosis not present

## 2020-09-18 DIAGNOSIS — M7542 Impingement syndrome of left shoulder: Secondary | ICD-10-CM

## 2020-09-18 NOTE — Telephone Encounter (Signed)
Patient submitted medical release form, social security long term disability form, and payment of $25.00 check. Accepted 09/18/20

## 2020-09-18 NOTE — Progress Notes (Signed)
Office Visit Note   Patient: Jacqueline Sparks           Date of Birth: 1961-02-21           MRN: [id number] Visit Date: 09/18/2020              Requested by: Mar Daring, PA-C [address] [place],  [place] [address] PCP: Mar Daring, PA-C  Chief Complaint  Patient presents with  . Left Shoulder - Follow-up    MRI review       HPI: Patient is a 59 year old woman who presents in follow-up status post MRI scan of her left shoulder.  Patient is currently disabled from work due to her right shoulder symptoms.  Patient states she is undergoing mental health evaluation for her anxiety due to her shoulder problems.  Patient has worked for the [employer] and is required to lift heavy packages and items.  Assessment & Plan: Visit Diagnoses:  1. Impingement syndrome of left shoulder   2. Impingement syndrome of right shoulder     Plan: Plan: Discussed that there are several options to treat the left shoulder including therapy injections versus arthroscopic debridement.  Discussed that with arthroscopic debridement she should have about 75% of improvement in her symptoms.  It is my medical opinion that it is reasonable that her right shoulder rotator cuff tear was caused or exacerbated by the having lifting the patient has been required to do at work.  Follow-Up Instructions: Return if symptoms worsen or fail to improve.   Ortho Exam  Patient is alert, oriented, no adenopathy, well-dressed, normal affect, normal respiratory effort. Examination patient has pain with range of motion of both shoulders.  Focusing on her left shoulder the MRI scan does show tendinopathy of the supraspinatus and subscapularis tendons with some mild part showing thickness articular surface tearing of the subscapularis.  Patient does have bursitis in the subacromial space with possible associated adhesive capsulitis.  There is some degenerative changes of the [facility] joint.  Imaging: No  results found. No images are attached to the encounter.  Labs: Lab Results  Component Value Date   HGBA1C 7.0 (A) 06/28/2020   HGBA1C 7.3 (A) 03/15/2020   HGBA1C 7.7 (A) 09/12/2019   LABURIC 5.3 04/10/2017     Lab Results  Component Value Date   ALBUMIN 4.4 03/26/2020   ALBUMIN 4.2 03/03/2019   ALBUMIN 4.1 11/24/2016   LABURIC 5.3 04/10/2017    No results found for: MG Lab Results  Component Value Date   VD25OH 42.8 03/26/2020    No results found for: PREALBUMIN CBC EXTENDED Latest Ref Rng & Units 03/26/2020 03/03/2019 11/24/2016  WBC 3.4 - 10.8 x10E3/uL 7.5 7.5 8.6  RBC 3.77 - 5.28 x10E6/uL 5.06 4.62 4.95  HGB 11.1 - 15.9 g/dL 13.5 12.5 13.8  HCT 34.0 - 46.6 % 41.8 37.4 41.4  PLT 150 - 450 x10E3/uL 308 305 375  NEUTROABS 1.40 - 7.00 x10E3/uL 3.2 3.4 3.9  LYMPHSABS 0 - 3 x10E3/uL 3.7(H) 3.2(H) 4.1(H)     There is no height or weight on file to calculate BMI.  Orders:  No orders of the defined types were placed in this encounter.  No orders of the defined types were placed in this encounter.    Procedures: No procedures performed  Clinical Data: No additional findings.  ROS:  All other systems negative, except as noted in the HPI. Review of Systems  Objective: Vital Signs: There were no vitals taken  for this visit.  Specialty Comments:  No specialty comments available.  PMFS History: Patient Active Problem List   Diagnosis Date Noted  . Psoriasis 06/01/2017  . Polyp of sigmoid colon   . Benign neoplasm of descending colon   . Impingement syndrome of left shoulder 10/06/2016  . Impingement syndrome of right shoulder 10/06/2016  . BP (high blood pressure) 11/12/2015  . Abnormal abdominal MRI 11/09/2015  . Abnormal ECG 11/09/2015  . Anxiety 11/09/2015  . Obesity 01/30/2014  . Smoking 01/30/2014  . Avitaminosis D 05/03/2010  . Obstructive apnea 06/09/2007  . Hypercholesteremia 06/09/2007  . Compulsive tobacco user syndrome 06/09/2007  . Renal  tubular disorder 05/19/2007  . Diabetes mellitus, type 2 ([place]) 06/04/2006   Past Medical History:  Diagnosis Date  . Anxiety   . Bronchitis   . Diabetes mellitus without complication ([place])   . GERD (gastroesophageal reflux disease)   . Hyperlipidemia   . Sleep apnea     Family History  Problem Relation Age of Onset  . Hyperlipidemia Mother   . Hypertension Mother   . Diabetes Mother   . Diabetes Father   . Hyperlipidemia Father   . Hypertension Father     Past Surgical History:  Procedure Laterality Date  . CARPAL TUNNEL RELEASE  2001   as staated this was the left side, right side was completed in 2003  . COLONOSCOPY WITH PROPOFOL N/A 02/17/2017   Procedure: COLONOSCOPY WITH PROPOFOL;  Surgeon: Lucilla Lame, MD;  Location: [facility] ENDOSCOPY;  Service: Endoscopy;  Laterality: N/A;  . DILATION AND CURETTAGE OF UTERUS  2008   as stated uterine polyps  . ROTATOR CUFF REPAIR Right 3004 and 2010  . TUBAL LIGATION  1999   Social History   Occupational History  . Not on file  Tobacco Use  . Smoking status: Current Every Day Smoker    Packs/day: 1.00    Years: 30.00    Pack years: 30.00    Types: Cigarettes  . Smokeless tobacco: Never Used  Vaping Use  . Vaping Use: Never used  Substance and Sexual Activity  . Alcohol use: Yes    Comment: seldom use  . Drug use: No  . Sexual activity: Never

## 2020-09-28 ENCOUNTER — Encounter: Payer: Self-pay | Admitting: Physician Assistant

## 2020-09-28 ENCOUNTER — Ambulatory Visit: Payer: Federal, State, Local not specified - PPO | Admitting: Physician Assistant

## 2020-09-28 ENCOUNTER — Other Ambulatory Visit: Payer: Self-pay

## 2020-09-28 VITALS — BP 133/73 | HR 77 | Temp 98.8°F | Resp 16 | Wt 162.0 lb

## 2020-09-28 DIAGNOSIS — L409 Psoriasis, unspecified: Secondary | ICD-10-CM

## 2020-09-28 DIAGNOSIS — J418 Mixed simple and mucopurulent chronic bronchitis: Secondary | ICD-10-CM | POA: Diagnosis not present

## 2020-09-28 DIAGNOSIS — E119 Type 2 diabetes mellitus without complications: Secondary | ICD-10-CM

## 2020-09-28 DIAGNOSIS — G4733 Obstructive sleep apnea (adult) (pediatric): Secondary | ICD-10-CM | POA: Diagnosis not present

## 2020-09-28 DIAGNOSIS — Z9989 Dependence on other enabling machines and devices: Secondary | ICD-10-CM

## 2020-09-28 LAB — POCT GLYCOSYLATED HEMOGLOBIN (HGB A1C)
Est. average glucose Bld gHb Est-mCnc: 194
Hemoglobin A1C: 8.4 % — AB (ref 4.0–5.6)

## 2020-09-28 MED ORDER — MOMETASONE FUROATE 0.1 % EX CREA: 1.0000 "application " | TOPICAL_CREAM | Freq: Every day | CUTANEOUS | 1 refills | Status: DC

## 2020-09-28 MED ORDER — ANORO ELLIPTA 62.5-25 MCG/INH IN AEPB: 1.0000 | INHALATION_SPRAY | Freq: Every day | RESPIRATORY_TRACT | 1 refills | Status: DC

## 2020-09-28 NOTE — Patient Instructions (Signed)
Umeclidinium; Vilanterol inhalation powder What is this medicine? UMECLIDINIUM; VILANTEROL (ue MEK li DIN ee um; vye LAN ter ol) inhalation is a combination of two medicines that decrease inflammation and help to open up the airways of your lungs. It is for chronic obstructive pulmonary disease (COPD), including chronic bronchitis or emphysema. Do NOT use for asthma or an acute asthma attack. Do NOT use for a COPD attack. This medicine may be used for other purposes; ask your health care provider or pharmacist if you have questions. COMMON BRAND NAME(S): ANORO ELLIPTA What should I tell my health care provider before I take this medicine? They need to know if you have any of these conditions:  bladder problems or difficulty passing urine  diabetes  glaucoma  heart disease or irregular heartbeat  high blood pressure  kidney disease  pheochromocytoma  prostate disease  seizures  thyroid disease  an unusual or allergic reaction to umeclidinium, vilanterol, lactose, milk proteins, other medicines, foods, dyes, or preservatives  pregnant or trying to get pregnant  breast-feeding How should I use this medicine? This medicine is inhaled through the mouth. It is used once per day. Follow the directions on the prescription label. Do not use a spacer device with this inhaler. Take your medicine at regular intervals. Do not take your medicine more often than directed. Do not stop taking except on your doctor's advice. Make sure that you are using your inhaler correctly. Ask you doctor or health care provider if you have any questions. Talk to your pediatrician regarding the use of this medicine in children. Special care may be needed. Overdosage: If you think you have taken too much of this medicine contact a poison control center or emergency room at once. NOTE: This medicine is only for you. Do not share this medicine with others. What if I miss a dose? If you miss a dose, use it as  soon as you can. If it is almost time for your next dose, use only that dose and continue with your regular schedule. Do not use double or extra doses. What may interact with this medicine? Do not take this medicine with any of the following medications:  cisapride  dofetilide  dronedarone  MAOIs like Carbex, Eldepryl, Marplan, Nardil, and Parnate  pimozide  thioridazine  ziprasidone This medicine may also interact with the following medications:  antihistamines for allergy  antiviral medicines for HIV or AIDS  atropine  beta-blockers like metoprolol and propranolol  certain medicines for bladder problems like oxybutynin, tolterodine  certain medicines for depression, anxiety, or psychotic disturbances  certain medicines for Parkinson's disease like benztropine, trihexyphenidyl  certain medicines for stomach problems like dicyclomine, hyoscyamine  certain medicines for travel sickness like scopolamine  diuretics  ipratropium  medicines for colds  medicines for fungal infections like ketoconazole and itraconazole  other medicines for breathing problems  other medicines that prolong the QT interval (cause an abnormal heart rhythm)  tiotropium This list may not describe all possible interactions. Give your health care provider a list of all the medicines, herbs, non-prescription drugs, or dietary supplements you use. Also tell them if you smoke, drink alcohol, or use illegal drugs. Some items may interact with your medicine. What should I watch for while using this medicine? Visit your doctor or health care professional for regular checkups. Tell your doctor or health care professional if your symptoms do not get better. If your symptoms get worse or if you need your short-acting inhalers more often, call your  doctor right away. Do not use this medicine more than once every 24 hours. What side effects may I notice from receiving this medicine? Side effects that you  should report to your doctor or health care professional as soon as possible:  allergic reactions like skin rash or hives, swelling of the face, lips, or tongue  breathing problems right after inhaling your medicine  changes in vision  chest pain  eye pain  fast, irregular heartbeat  feeling faint or lightheaded, falls  fever or chills  nausea, vomiting  trouble passing urine or change in the amount of urine Side effects that usually do not require medical attention (report to your doctor or health care professional if they continue or are bothersome):  constipation  cough  diarrhea  headache  muscle cramps  nervousness  sore throat  tremor This list may not describe all possible side effects. Call your doctor for medical advice about side effects. You may report side effects to FDA at 1-800-FDA-1088. Where should I keep my medicine? Keep out of the reach of children. Store at room temperature between 15 and 30 degrees C (59 and 86 degrees F). Store in a dry place away from direct heat or sunlight. Throw away 6 weeks after you remove the inhaler from the foil tray, or after the dose indicator reads 0, whichever comes first. Throw away any unopened packages after the expiration date. NOTE: This sheet is a summary. It may not cover all possible information. If you have questions about this medicine, talk to your doctor, pharmacist, or health care provider.  2020 Elsevier/Gold Standard (2018-03-22 14:47:24)

## 2020-09-28 NOTE — Progress Notes (Signed)
Established patient visit   Patient: Jacqueline Sparks   DOB: Jun 02, 1961   59 y.o. Female  MRN: [id number] Visit Date: 09/28/2020  Today's healthcare provider: Mar Daring, PA-C   Chief Complaint  Patient presents with  . Follow-up   Subjective    HPI  Patient here for T2DM follow up. This is a chronic problem. She is currently on glipizide 10mg  BID, Januvia 100mg  daily, Pioglitazone 45mg  daily and Trulicity 7.82 SQ weekly. She has been having some chest pressure for off and on for the past couple of weeks.  Patient Declined Tdap and Influenza vaccine.  She also need a prescription to send to [place]. She needs a new power cord and new hose for her cpap machine. This is cpap numbers incase they are needed. [id number] RESMED [id number]  Patient Active Problem List   Diagnosis Date Noted  . Psoriasis 06/01/2017  . Polyp of sigmoid colon   . Benign neoplasm of descending colon   . Impingement syndrome of left shoulder 10/06/2016  . Impingement syndrome of right shoulder 10/06/2016  . BP (high blood pressure) 11/12/2015  . Abnormal abdominal MRI 11/09/2015  . Abnormal ECG 11/09/2015  . Anxiety 11/09/2015  . Obesity 01/30/2014  . Smoking 01/30/2014  . Avitaminosis D 05/03/2010  . Obstructive apnea 06/09/2007  . Hypercholesteremia 06/09/2007  . Compulsive tobacco user syndrome 06/09/2007  . Renal tubular disorder 05/19/2007  . Diabetes mellitus, type 2 ([place]) 06/04/2006   Past Medical History:  Diagnosis Date  . Anxiety   . Bronchitis   . Diabetes mellitus without complication ([place])   . GERD (gastroesophageal reflux disease)   . Hyperlipidemia   . Sleep apnea        Medications: Outpatient Medications Prior to Visit  Medication Sig  . ALPRAZolam (XANAX) 0.5 MG tablet TAKE 1/2 TO 1 (ONE-HALF TO ONE) TABLET BY MOUTH TWICE DAILY AS NEEDED  . atorvastatin (LIPITOR) 20 MG tablet Take 1 tablet by mouth once daily  . clobetasol ointment  (TEMOVATE) 7.25 % Apply 1 application topically 2 (two) times daily.  Marland Kitchen glipiZIDE (GLUCOTROL) 10 MG tablet TAKE 1 TABLET BY MOUTH TWICE DAILY 30 MINUTES BEFORE A MEAL.  Marland Kitchen JANUVIA 100 MG tablet Take 1 tablet by mouth once daily  . NON FORMULARY CPAP  . ONE TOUCH ULTRA TEST test strip USE ONE STRIP TO CHECK GLUCOSE ONCE DAILY  . pioglitazone (ACTOS) 45 MG tablet Take 1 tablet by mouth once daily  . predniSONE (DELTASONE) 10 MG tablet Taper down by 1 tablet by mouth starting at 6 day 1, 5 day 2, 4 day 3, 3 day 4, 2 day 5 and 1 day 6. Divide dosage among meals and bedtime each day.  . TRULICITY 3.66 YQ/0.3KV SOPN INJECT 1 SYRINGEFUL SUBCUTANEOUSLY ONCE A WEEK   No facility-administered medications prior to visit.    Review of Systems  Constitutional: Negative for appetite change, chills, fatigue and fever.  Respiratory: Negative for chest tightness and shortness of breath.   Cardiovascular: Negative for chest pain and palpitations.  Gastrointestinal: Negative for abdominal pain, nausea and vomiting.  Endocrine: Negative for polydipsia, polyphagia and polyuria.  Neurological: Negative for dizziness and weakness.    Last CBC Lab Results  Component Value Date   WBC 7.5 03/26/2020   HGB 13.5 03/26/2020   HCT 41.8 03/26/2020   MCV 83 03/26/2020   MCH 26.7 03/26/2020   RDW 14.4 03/26/2020   PLT 308 42/59/5638   Last metabolic panel Lab  Results  Component Value Date   GLUCOSE 101 (H) 03/26/2020   NA 143 03/26/2020   K 4.3 03/26/2020   CL 105 03/26/2020   CO2 26 03/26/2020   BUN 5 (L) 03/26/2020   CREATININE 0.76 03/26/2020   GFRNONAA 87 03/26/2020   GFRAA 101 03/26/2020   CALCIUM 9.4 03/26/2020   PROT 7.4 03/26/2020   ALBUMIN 4.4 03/26/2020   LABGLOB 3.0 03/26/2020   AGRATIO 1.5 03/26/2020   BILITOT <0.2 03/26/2020   ALKPHOS 88 03/26/2020   AST 21 03/26/2020   ALT 15 03/26/2020      Objective    BP 133/73 (BP Location: Left Arm, Patient Position: Sitting, Cuff Size:  Normal)   Pulse 77   Temp 98.8 F (37.1 C) (Oral)   Resp 16   Wt 162 lb (73.5 kg)   BMI 28.70 kg/m  BP Readings from Last 3 Encounters:  09/28/20 133/73  08/14/20 111/62  06/28/20 129/79   Wt Readings from Last 3 Encounters:  09/28/20 162 lb (73.5 kg)  08/23/20 161 lb (73 kg)  08/14/20 161 lb (73 kg)      Physical Exam Vitals reviewed.  Constitutional:      General: She is not in acute distress.    Appearance: Normal appearance. She is well-developed and well-nourished. She is not ill-appearing or diaphoretic.  Cardiovascular:     Rate and Rhythm: Normal rate and regular rhythm.     Heart sounds: Normal heart sounds. No friction rub. No gallop.   Pulmonary:     Effort: Pulmonary effort is normal. No respiratory distress.     Breath sounds: Normal breath sounds. No wheezing or rales.  Musculoskeletal:     Cervical back: Normal range of motion and neck supple.  Neurological:     Mental Status: She is alert.      Results for orders placed or performed in visit on 09/28/20  POCT glycosylated hemoglobin (Hb A1C)  Result Value Ref Range   Hemoglobin A1C 8.4 (A) 4.0 - 5.6 %   Est. average glucose Bld gHb Est-mCnc 194     Assessment & Plan     1. Type 2 diabetes mellitus without complication, without long-term current use of insulin (HCC) A1c increased from 7.0 to 8.4. Patient admits to medication non-compliance. Will try to take regularly. Conitnue Glipizide 10mg  BID, Januvia 100mg  daily, pioglitazone 45mg  daily and Trulicity 0.75mg  once weekly. F/U in 3 months.  - POCT glycosylated hemoglobin (Hb A1C)  2. Mixed simple and mucopurulent chronic bronchitis (HCC) Will start Anoro as below for progressive COPD. Call if continues to worsen and will increase therapy.  - umeclidinium-vilanterol (ANORO ELLIPTA) 62.5-25 MCG/INH AEPB; Inhale 1 puff into the lungs daily.  Dispense: 60 each; Refill: 1  3. Psoriasis Stable. Diagnosis pulled for medication refill. Continue current  medical treatment plan. - mometasone (ELOCON) 0.1 % cream; Apply 1 application topically daily.  Dispense: 45 g; Refill: 1  4. OSA on CPAP Uses CPAP nightly until she lost her power cord. Needs new cord. Also tubing as a hole in it. Needs new tubing. Order will be faxed to [place].    Return in about 3 months (around 12/27/2020), or if symptoms worsen or fail to improve, for a1c.      I, Mar Daring, PA-C, have reviewed all documentation for this visit. The documentation on 10/08/20 for the exam, diagnosis, procedures, and orders are all accurate and complete.   Mar Daring, PA-C  [facility] [phone] (phone)  [phone] (fax)  [place]

## 2020-10-03 ENCOUNTER — Encounter: Payer: Self-pay | Admitting: Orthopedic Surgery

## 2020-10-08 ENCOUNTER — Encounter: Payer: Self-pay | Admitting: Physician Assistant

## 2020-10-11 ENCOUNTER — Other Ambulatory Visit: Payer: Self-pay | Admitting: Physician Assistant

## 2020-10-11 DIAGNOSIS — E119 Type 2 diabetes mellitus without complications: Secondary | ICD-10-CM

## 2020-11-02 ENCOUNTER — Telehealth: Payer: Self-pay | Admitting: Orthopedic Surgery

## 2020-11-02 NOTE — Telephone Encounter (Signed)
Patient has forwarded a letter to me dated October 25, 2020 requesting additional information for my opinion.  Patient requests an anatomic description of the rotator cuff.  The rotator cuff is a construct of muscles and tendons that support the shoulder and stabilize the shoulder allowing it to move through a full range of motion.  As patient's age the collagen and the rotator cuff ages and become susceptible to degenerative changes.  Mechanical stress can  acutely affect the rotator cuff.  As I stated previously the patient has had chronic rotator cuff degenerative changes and she did have an acute injury to the chronically degenerated rotator cuff at work.  This acute injury has aggravated and injured her pre-existing degenerative changes.  This occurred at work.

## 2020-11-06 ENCOUNTER — Telehealth: Payer: Self-pay

## 2020-11-06 NOTE — Telephone Encounter (Signed)
FYI

## 2020-11-06 NOTE — Telephone Encounter (Signed)
Pt dropped off letter from her attorney. Letter placed in Jenni's box. TNP

## 2020-11-08 ENCOUNTER — Telehealth: Payer: Self-pay | Admitting: Physical Medicine and Rehabilitation

## 2020-11-08 DIAGNOSIS — F411 Generalized anxiety disorder: Secondary | ICD-10-CM | POA: Diagnosis not present

## 2020-11-08 DIAGNOSIS — F330 Major depressive disorder, recurrent, mild: Secondary | ICD-10-CM | POA: Diagnosis not present

## 2020-11-08 DIAGNOSIS — G4733 Obstructive sleep apnea (adult) (pediatric): Secondary | ICD-10-CM | POA: Diagnosis not present

## 2020-11-08 NOTE — Telephone Encounter (Signed)
Called pt and sch an OV.

## 2020-11-08 NOTE — Telephone Encounter (Signed)
Patient called needing to schedule an appointment with Dr Ernestina Patches for her back. The number to contact patient is [phone]

## 2020-11-09 ENCOUNTER — Other Ambulatory Visit: Payer: Self-pay

## 2020-11-12 ENCOUNTER — Encounter: Payer: Self-pay | Admitting: Orthopedic Surgery

## 2020-11-12 NOTE — Progress Notes (Signed)
November 09, 2020  Patient: Jacqueline Sparks  MRN: [id number]  Date of Birth: 01/11/1961  Date of Visit: 11/09/2020     Patient has forwarded a letter to me dated October 25, 2020 requesting additional information for my opinion.  Patient requests an anatomic description of the rotator cuff.  The rotator cuff is a construct of muscles and tendons that support the shoulder and stabilize the shoulder allowing it to move through a full range of motion.  As patient's age the collagen and the rotator cuff ages and become susceptible to degenerative changes.  Mechanical  stress trauma can  acutely affect the rotator cuff.  As I stated previously the patient has had chronic rotator cuff degenerative changes and she did have an acute injury to the chronically degenerated rotator cuff at work.  This acute injury has aggravated and injured her pre-existing degenerative changes.  This occurred at work.

## 2020-11-13 ENCOUNTER — Encounter: Payer: Self-pay | Admitting: Physical Medicine and Rehabilitation

## 2020-11-13 ENCOUNTER — Other Ambulatory Visit: Payer: Self-pay

## 2020-11-13 ENCOUNTER — Ambulatory Visit: Payer: Self-pay

## 2020-11-13 ENCOUNTER — Ambulatory Visit (INDEPENDENT_AMBULATORY_CARE_PROVIDER_SITE_OTHER): Payer: Federal, State, Local not specified - PPO | Admitting: Physical Medicine and Rehabilitation

## 2020-11-13 VITALS — BP 131/81 | HR 81

## 2020-11-13 DIAGNOSIS — G8929 Other chronic pain: Secondary | ICD-10-CM | POA: Diagnosis not present

## 2020-11-13 DIAGNOSIS — M25551 Pain in right hip: Secondary | ICD-10-CM | POA: Diagnosis not present

## 2020-11-13 DIAGNOSIS — M5416 Radiculopathy, lumbar region: Secondary | ICD-10-CM | POA: Diagnosis not present

## 2020-11-13 DIAGNOSIS — M5441 Lumbago with sciatica, right side: Secondary | ICD-10-CM

## 2020-11-13 NOTE — Telephone Encounter (Signed)
Letter completed and will fax tomorrow.

## 2020-11-13 NOTE — Progress Notes (Signed)
Pt state lower back pain that travels to her right anterior groin and right leg. Pt state sometime when she takes a step her foot gives out or don't move. Pt state walking and standing makes the pain worse. Pt state she takes over the counter pain meds and heating pads to help ease the pain.  Numeric Pain Rating Scale and Functional Assessment Average Pain 7 Pain Right Now 4 My pain is constant, dull and stabbing Pain is worse with: walking, bending, sitting and standing Pain improves with: rest, heat/ice and medication   In the last MONTH (on 0-10 scale) has pain interfered with the following?  1. General activity like being  able to carry out your everyday physical activities such as walking, climbing stairs, carrying groceries, or moving a chair?  Rating(7)  2. Relation with others like being able to carry out your usual social activities and roles such as  activities at home, at work and in your community. Rating(7)  3. Enjoyment of life such that you have  been bothered by emotional problems such as feeling anxious, depressed or irritable?  Rating(7)

## 2020-11-14 ENCOUNTER — Encounter: Payer: Self-pay | Admitting: Physician Assistant

## 2020-11-19 ENCOUNTER — Ambulatory Visit: Payer: Self-pay

## 2020-11-19 ENCOUNTER — Encounter: Payer: Self-pay | Admitting: Physical Medicine and Rehabilitation

## 2020-11-19 ENCOUNTER — Other Ambulatory Visit: Payer: Self-pay

## 2020-11-19 ENCOUNTER — Ambulatory Visit: Payer: Federal, State, Local not specified - PPO | Admitting: Physical Medicine and Rehabilitation

## 2020-11-19 VITALS — BP 135/75 | HR 76

## 2020-11-19 DIAGNOSIS — R202 Paresthesia of skin: Secondary | ICD-10-CM | POA: Diagnosis not present

## 2020-11-19 MED ORDER — BETAMETHASONE SOD PHOS & ACET 6 (3-3) MG/ML IJ SUSP
12.0000 mg | Freq: Once | INTRAMUSCULAR | Status: AC
  Administered 2020-11-19: 12 mg

## 2020-11-19 NOTE — Progress Notes (Signed)
Jacqueline Sparks - 60 y.o. female MRN [id number]  Date of birth: [birth year]  Office Visit Note: Visit Date: 11/19/2020 PCP: Mar Daring, PA-C Referred by: Mar Daring, Mamie Nick*  Subjective: No chief complaint on file.  HPI:  Jacqueline Sparks is a 60 y.o. female who comes in today  for planned Right L5-S1 Lumbar epidural steroid injection with fluoroscopic guidance.  The patient has failed conservative care including home exercise, medications, time and activity modification.  This injection will be diagnostic and hopefully therapeutic.  Please see requesting physician notes for further details and justification.   ROS Otherwise per HPI.  Assessment & Plan: Visit Diagnoses:    ICD-10-CM   1. Paresthesia of skin  R20.2 XR C-ARM NO REPORT    Epidural Steroid injection    betamethasone acetate-betamethasone sodium phosphate (CELESTONE) injection 12 mg    Plan: No additional findings.   Meds & Orders:  Meds ordered this encounter  Medications  . betamethasone acetate-betamethasone sodium phosphate (CELESTONE) injection 12 mg    Orders Placed This Encounter  Procedures  . XR C-ARM NO REPORT  . Epidural Steroid injection    Follow-up: Return if symptoms worsen or fail to improve.   Procedures: No procedures performed  Lumbar Epidural Steroid Injection - Interlaminar Approach with Fluoroscopic Guidance  Patient: Jacqueline Sparks      Date of Birth: 06/22/1961 MRN: [id number] PCP: Mar Daring, PA-C      Visit Date: 11/19/2020   Universal Protocol:     Consent Given By: the patient  Position: PRONE  Additional Comments: Vital signs were monitored before and after the procedure. Patient was prepped and draped in the usual sterile fashion. The correct patient, procedure, and site was verified.   Injection Procedure Details:   Procedure diagnoses: Paresthesia of skin [R20.2]   Meds Administered:  Meds ordered this encounter  Medications  . betamethasone  acetate-betamethasone sodium phosphate (CELESTONE) injection 12 mg     Laterality: Right  Location/Site:  L5-S1  Needle: 3.5 in., 20 ga. Tuohy  Needle Placement: Paramedian epidural  Findings:   -Comments: Excellent flow of contrast into the epidural space.  Procedure Details: Using a paramedian approach from the side mentioned above, the region overlying the inferior lamina was localized under fluoroscopic visualization and the soft tissues overlying this structure were infiltrated with 4 ml. of 1% Lidocaine without Epinephrine. The Tuohy needle was inserted into the epidural space using a paramedian approach.   The epidural space was localized using loss of resistance along with counter oblique bi-planar fluoroscopic views.  After negative aspirate for air, blood, and CSF, a 2 ml. volume of Isovue-250 was injected into the epidural space and the flow of contrast was observed. Radiographs were obtained for documentation purposes.    The injectate was administered into the level noted above.   Additional Comments:  The patient tolerated the procedure well Dressing: 2 x 2 sterile gauze and Band-Aid    Post-procedure details: Patient was observed during the procedure. Post-procedure instructions were reviewed.  Patient left the clinic in stable condition.     Clinical History: MRI LUMBAR SPINE WITHOUT CONTRAST   TECHNIQUE:  Multiplanar, multisequence MR imaging was performed. No intravenous  contrast was administered.   COMPARISON: Lumbar MRI 09/05/2011   FINDINGS:  Mild right-sided hydronephrosis unchanged from the prior study.  Question right UPJ stricture.   Normal lumbar alignment. Negative for fracture or mass. No bone  marrow edema. Conus medullaris is normal and terminates at mid L2.  L1-2: Negative   L2-3: Negative   L3-4: Small left lateral disc protrusion which could cause  irritation of the left L3 nerve root. This has developed since the  prior  MRI.   L4-5: Small left foraminal and left lateral disc protrusion with  mild associated spurring. This could cause impingement of the left  L4 nerve root in the foramen. This finding is slightly more  prominent than on the prior study. Mild facet hypertrophy  bilaterally. Spinal canal is adequate in size.   L5-S1: Disc degeneration and spondylosis. There is moderate to  marked right foraminal encroachment with impingement of the right L5  nerve root, similar to the prior study.   IMPRESSION:  Small left lateral disc protrusion L3-4, new finding.   Left foraminal disc protrusion L4-5 has progressed and may be  causing impingement of the left L4 nerve root.   Marked right foraminal encroachment and impingement of the right L5  nerve root is unchanged.   Mild right hydronephrosis, unchanged.    Electronically Signed  By: Franchot Gallo M.D.  On: 09/05/2013 15:27     Objective:  VS:  HT:    WT:   BMI:     BP:135/75  HR:76bpm  TEMP: ( )  RESP:  Physical Exam Vitals and nursing note reviewed.  Constitutional:      General: She is not in acute distress.    Appearance: Normal appearance. She is not ill-appearing.  HENT:     Head: Normocephalic and atraumatic.     Right Ear: External ear normal.     Left Ear: External ear normal.  Eyes:     Extraocular Movements: Extraocular movements intact.  Cardiovascular:     Rate and Rhythm: Normal rate.     Pulses: Normal pulses.  Pulmonary:     Effort: Pulmonary effort is normal. No respiratory distress.  Abdominal:     General: There is no distension.     Palpations: Abdomen is soft.  Musculoskeletal:        General: Tenderness present.     Cervical back: Neck supple.     Right lower leg: No edema.     Left lower leg: No edema.     Comments: Patient has good distal strength with no pain over the greater trochanters.  No clonus or focal weakness.  Skin:    Findings: No erythema, lesion or rash.  Neurological:      General: No focal deficit present.     Mental Status: She is alert and oriented to person, place, and time.     Sensory: No sensory deficit.     Motor: No weakness or abnormal muscle tone.     Coordination: Coordination normal.  Psychiatric:        Mood and Affect: Mood normal.        Behavior: Behavior normal.      Imaging: No results found.

## 2020-11-19 NOTE — Progress Notes (Signed)
No changes to low back and right leg pain since OV on 1/25. Numeric Pain Rating Scale and Functional Assessment Average Pain 7   In the last MONTH (on 0-10 scale) has pain interfered with the following?  1. General activity like being  able to carry out your everyday physical activities such as walking, climbing stairs, carrying groceries, or moving a chair?  Rating(7)   +Driver, -BT, -Dye Allergies.

## 2020-11-19 NOTE — Patient Instructions (Signed)

## 2020-11-19 NOTE — Procedures (Signed)
Lumbar Epidural Steroid Injection - Interlaminar Approach with Fluoroscopic Guidance  Patient: Jacqueline Sparks      Date of Birth: 11-11-1960 MRN: [id number] PCP: Mar Daring, PA-C      Visit Date: 11/19/2020   Universal Protocol:     Consent Given By: the patient  Position: PRONE  Additional Comments: Vital signs were monitored before and after the procedure. Patient was prepped and draped in the usual sterile fashion. The correct patient, procedure, and site was verified.   Injection Procedure Details:   Procedure diagnoses: Paresthesia of skin [R20.2]   Meds Administered:  Meds ordered this encounter  Medications  . betamethasone acetate-betamethasone sodium phosphate (CELESTONE) injection 12 mg     Laterality: Right  Location/Site:  L5-S1  Needle: 3.5 in., 20 ga. Tuohy  Needle Placement: Paramedian epidural  Findings:   -Comments: Excellent flow of contrast into the epidural space.  Procedure Details: Using a paramedian approach from the side mentioned above, the region overlying the inferior lamina was localized under fluoroscopic visualization and the soft tissues overlying this structure were infiltrated with 4 ml. of 1% Lidocaine without Epinephrine. The Tuohy needle was inserted into the epidural space using a paramedian approach.   The epidural space was localized using loss of resistance along with counter oblique bi-planar fluoroscopic views.  After negative aspirate for air, blood, and CSF, a 2 ml. volume of Isovue-250 was injected into the epidural space and the flow of contrast was observed. Radiographs were obtained for documentation purposes.    The injectate was administered into the level noted above.   Additional Comments:  The patient tolerated the procedure well Dressing: 2 x 2 sterile gauze and Band-Aid    Post-procedure details: Patient was observed during the procedure. Post-procedure instructions were reviewed.  Patient left the  clinic in stable condition.

## 2020-11-19 NOTE — Telephone Encounter (Signed)
Pt calling again and is requesting to have PCP sign this letter since she is back in office. Please advise.    Fax # [phone]

## 2020-11-22 DIAGNOSIS — F411 Generalized anxiety disorder: Secondary | ICD-10-CM | POA: Diagnosis not present

## 2020-11-22 DIAGNOSIS — F330 Major depressive disorder, recurrent, mild: Secondary | ICD-10-CM | POA: Diagnosis not present

## 2020-11-29 DIAGNOSIS — F411 Generalized anxiety disorder: Secondary | ICD-10-CM | POA: Diagnosis not present

## 2020-11-29 DIAGNOSIS — F330 Major depressive disorder, recurrent, mild: Secondary | ICD-10-CM | POA: Diagnosis not present

## 2020-11-30 DIAGNOSIS — F411 Generalized anxiety disorder: Secondary | ICD-10-CM | POA: Diagnosis not present

## 2020-11-30 DIAGNOSIS — F330 Major depressive disorder, recurrent, mild: Secondary | ICD-10-CM | POA: Diagnosis not present

## 2020-12-02 DIAGNOSIS — F330 Major depressive disorder, recurrent, mild: Secondary | ICD-10-CM | POA: Diagnosis not present

## 2020-12-02 DIAGNOSIS — F411 Generalized anxiety disorder: Secondary | ICD-10-CM | POA: Diagnosis not present

## 2020-12-05 DIAGNOSIS — F411 Generalized anxiety disorder: Secondary | ICD-10-CM | POA: Diagnosis not present

## 2020-12-05 DIAGNOSIS — F330 Major depressive disorder, recurrent, mild: Secondary | ICD-10-CM | POA: Diagnosis not present

## 2020-12-07 ENCOUNTER — Ambulatory Visit
Admission: RE | Admit: 2020-12-07 | Discharge: 2020-12-07 | Disposition: A | Discharge status: Home / Self Care | Payer: Disability Insurance | Attending: Pediatrics | Admitting: Pediatrics

## 2020-12-07 ENCOUNTER — Other Ambulatory Visit: Payer: Self-pay | Admitting: Pediatrics

## 2020-12-07 ENCOUNTER — Ambulatory Visit
Admission: RE | Admit: 2020-12-07 | Discharge: 2020-12-07 | Disposition: A | Discharge status: Home / Self Care | Payer: Disability Insurance | Source: Ambulatory Visit | Attending: Pediatrics | Admitting: Pediatrics

## 2020-12-07 DIAGNOSIS — M5137 Other intervertebral disc degeneration, lumbosacral region: Secondary | ICD-10-CM | POA: Diagnosis not present

## 2020-12-07 DIAGNOSIS — M5134 Other intervertebral disc degeneration, thoracic region: Secondary | ICD-10-CM | POA: Insufficient documentation

## 2020-12-09 ENCOUNTER — Other Ambulatory Visit: Payer: Self-pay | Admitting: Physician Assistant

## 2020-12-09 ENCOUNTER — Other Ambulatory Visit: Payer: Self-pay | Admitting: Family Medicine

## 2020-12-09 DIAGNOSIS — E1165 Type 2 diabetes mellitus with hyperglycemia: Secondary | ICD-10-CM

## 2020-12-09 DIAGNOSIS — F419 Anxiety disorder, unspecified: Secondary | ICD-10-CM

## 2020-12-09 DIAGNOSIS — L409 Psoriasis, unspecified: Secondary | ICD-10-CM

## 2020-12-10 MED ORDER — CLOBETASOL PROPIONATE 0.05 % EX OINT: 1.0000 "application " | TOPICAL_OINTMENT | Freq: Two times a day (BID) | CUTANEOUS | 3 refills | Status: AC

## 2020-12-10 MED ORDER — ALPRAZOLAM 0.5 MG PO TABS: 0.5000 mg | ORAL_TABLET | Freq: Two times a day (BID) | ORAL | 5 refills | Status: AC | PRN

## 2020-12-10 MED ORDER — GLIPIZIDE 10 MG PO TABS: 10.0000 mg | ORAL_TABLET | Freq: Two times a day (BID) | ORAL | 1 refills | Status: AC

## 2020-12-27 ENCOUNTER — Ambulatory Visit (INDEPENDENT_AMBULATORY_CARE_PROVIDER_SITE_OTHER): Payer: Federal, State, Local not specified - PPO | Admitting: Orthopedic Surgery

## 2020-12-27 ENCOUNTER — Telehealth: Payer: Self-pay | Admitting: Orthopedic Surgery

## 2020-12-27 DIAGNOSIS — M7541 Impingement syndrome of right shoulder: Secondary | ICD-10-CM

## 2020-12-27 DIAGNOSIS — M65331 Trigger finger, right middle finger: Secondary | ICD-10-CM | POA: Diagnosis not present

## 2020-12-27 DIAGNOSIS — M7711 Lateral epicondylitis, right elbow: Secondary | ICD-10-CM | POA: Diagnosis not present

## 2020-12-27 NOTE — Telephone Encounter (Addendum)
Received medical records release form  $25.00 check and disability paperwork    Forwarding to [place] today

## 2020-12-28 ENCOUNTER — Ambulatory Visit: Payer: Federal, State, Local not specified - PPO | Admitting: Physician Assistant

## 2020-12-28 ENCOUNTER — Encounter: Payer: Self-pay | Admitting: Physician Assistant

## 2020-12-28 ENCOUNTER — Other Ambulatory Visit: Payer: Self-pay

## 2020-12-28 VITALS — BP 125/79 | HR 78 | Temp 99.4°F | Wt 160.0 lb

## 2020-12-28 DIAGNOSIS — I10 Essential (primary) hypertension: Secondary | ICD-10-CM

## 2020-12-28 DIAGNOSIS — E119 Type 2 diabetes mellitus without complications: Secondary | ICD-10-CM | POA: Diagnosis not present

## 2020-12-28 DIAGNOSIS — E7800 Pure hypercholesterolemia, unspecified: Secondary | ICD-10-CM

## 2020-12-28 LAB — POCT GLYCOSYLATED HEMOGLOBIN (HGB A1C): Hemoglobin A1C: 8.4 % — AB (ref 4.0–5.6)

## 2020-12-28 MED ORDER — TRULICITY 1.5 MG/0.5ML ~~LOC~~ SOAJ: 1.5000 mg | SUBCUTANEOUS | 1 refills | Status: DC

## 2020-12-28 NOTE — Progress Notes (Signed)
Established patient visit   Patient: Jacqueline Sparks   DOB: [birth year]   60 y.o. Female  MRN: [id number] Visit Date: 12/28/2020  Today's healthcare provider: Mar Daring, PA-C   Chief Complaint  Patient presents with  . Hypertension  . Hyperlipidemia  . Diabetes   Subjective    HPI  Diabetes Mellitus Type II, follow-up  Lab Results  Component Value Date   HGBA1C 8.4 (A) 12/28/2020   HGBA1C 8.4 (A) 09/28/2020   HGBA1C 7.0 (A) 06/28/2020   Last seen for diabetes 3 months ago.  Management since then includes continuing the same treatment.  Work on being compliant with medications She reports excellent compliance with treatment. She is not having side effects.   Home blood sugar records: Are not being checked at home  Episodes of hypoglycemia? Yes Occasionally    Most Recent Eye Exam: Pt is due for an eye exam    Lipid/Cholesterol, follow-up  Last Lipid Panel: Lab Results  Component Value Date   CHOL 155 03/26/2020   LDLCALC 91 03/26/2020   HDL 30 (L) 03/26/2020   TRIG 198 (H) 03/26/2020    She was last seen for this 9 months ago.  Management since that visit includes no changes.  She reports excellent compliance with treatment. She is not having side effects.   Symptoms: No appetite changes No foot ulcerations  No chest pain No chest pressure/discomfort  Yes dyspnea No orthopnea  No fatigue No lower extremity edema  No palpitations No paroxysmal nocturnal dyspnea  No nausea No numbness or tingling of extremity  No polydipsia No polyuria  No speech difficulty No syncope   She is following a Regular diet. Current exercise: none  Last metabolic panel Lab Results  Component Value Date   GLUCOSE 101 (H) 03/26/2020   NA 143 03/26/2020   K 4.3 03/26/2020   BUN 5 (L) 03/26/2020   CREATININE 0.76 03/26/2020   GFRNONAA 87 03/26/2020   GFRAA 101 03/26/2020   CALCIUM 9.4 03/26/2020   AST 21 03/26/2020   ALT 15 03/26/2020   The 10-year  ASCVD risk score Mikey Bussing DC Jr., et al., 2013) is: 20.6%  ---------------------------------------------------------------------------------------------------   Patient Active Problem List   Diagnosis Date Noted  . Psoriasis 06/01/2017  . Polyp of sigmoid colon   . Benign neoplasm of descending colon   . Impingement syndrome of left shoulder 10/06/2016  . Impingement syndrome of right shoulder 10/06/2016  . BP (high blood pressure) 11/12/2015  . Abnormal abdominal MRI 11/09/2015  . Abnormal ECG 11/09/2015  . Anxiety 11/09/2015  . Obesity 01/30/2014  . Smoking 01/30/2014  . Avitaminosis D 05/03/2010  . Obstructive apnea 06/09/2007  . Hypercholesteremia 06/09/2007  . Compulsive tobacco user syndrome 06/09/2007  . Renal tubular disorder 05/19/2007  . Diabetes mellitus, type 2 ([place]) 06/04/2006   Past Medical History:  Diagnosis Date  . Anxiety   . Bronchitis   . Diabetes mellitus without complication ([place])   . GERD (gastroesophageal reflux disease)   . Hyperlipidemia   . Sleep apnea    Social History   Tobacco Use  . Smoking status: Current Every Day Smoker    Packs/day: 1.00    Years: 30.00    Pack years: 30.00    Types: Cigarettes  . Smokeless tobacco: Never Used  Vaping Use  . Vaping Use: Never used  Substance Use Topics  . Alcohol use: Yes    Comment: seldom use  . Drug use: No   No  Known Allergies   Medications: Outpatient Medications Prior to Visit  Medication Sig  . ALPRAZolam (XANAX) 0.5 MG tablet Take 1 tablet (0.5 mg total) by mouth 2 (two) times daily as needed for anxiety.  Marland Kitchen atorvastatin (LIPITOR) 20 MG tablet Take 1 tablet by mouth once daily  . clobetasol ointment (TEMOVATE) 3.53 % Apply 1 application topically 2 (two) times daily.  Marland Kitchen glipiZIDE (GLUCOTROL) 10 MG tablet Take 1 tablet (10 mg total) by mouth 2 (two) times daily before a meal.  . JANUVIA 100 MG tablet Take 1 tablet by mouth once daily  . mometasone (ELOCON) 0.1 % cream Apply 1  application topically daily.  . NON FORMULARY CPAP  . ONE TOUCH ULTRA TEST test strip USE ONE STRIP TO CHECK GLUCOSE ONCE DAILY  . pioglitazone (ACTOS) 45 MG tablet Take 1 tablet by mouth once daily  . TRULICITY 6.14 ER/1.5QM SOPN INJECT 1 SYRINGEFUL SUBCUTANEOUSLY ONCE A WEEK  . umeclidinium-vilanterol (ANORO ELLIPTA) 62.5-25 MCG/INH AEPB Inhale 1 puff into the lungs daily.  . predniSONE (DELTASONE) 10 MG tablet Taper down by 1 tablet by mouth starting at 6 day 1, 5 day 2, 4 day 3, 3 day 4, 2 day 5 and 1 day 6. Divide dosage among meals and bedtime each day.   No facility-administered medications prior to visit.    Review of Systems  Constitutional: Negative.   Respiratory: Negative.   Cardiovascular: Negative.   Neurological: Negative.   Psychiatric/Behavioral: Negative.       Objective    BP 125/79 (BP Location: Left Arm, Patient Position: Sitting, Cuff Size: Large)   Pulse 78   Temp 99.4 F (37.4 C) (Oral)   Wt 160 lb (72.6 kg)   BMI 28.34 kg/m    Physical Exam Vitals reviewed.  Constitutional:      General: She is not in acute distress.    Appearance: Normal appearance. She is well-developed and well-groomed. She is not diaphoretic.  HENT:     Head: Normocephalic and atraumatic.  Cardiovascular:     Rate and Rhythm: Normal rate and regular rhythm.     Pulses: Normal pulses.     Heart sounds: Normal heart sounds. No murmur heard. No friction rub. No gallop.   Pulmonary:     Effort: Pulmonary effort is normal. No respiratory distress.     Breath sounds: Normal breath sounds. No wheezing or rales.  Musculoskeletal:     Cervical back: Normal range of motion and neck supple.  Neurological:     General: No focal deficit present.     Mental Status: She is alert. Mental status is at baseline.     Motor: No weakness.  Psychiatric:        Mood and Affect: Mood normal.        Behavior: Behavior is cooperative.        Thought Content: Thought content normal.      Results for orders placed or performed in visit on 12/28/20  POCT glycosylated hemoglobin (Hb A1C)  Result Value Ref Range   Hemoglobin A1C 8.4 (A) 4.0 - 5.6 %    Assessment & Plan     1. Primary hypertension Stable.   2. Type 2 diabetes mellitus without complication, without long-term current use of insulin (HCC) Elevated. Add Trulicity as below. Continue Pioglitazone 45mg , Januvia 100mg , glipizide 10mg  BID. F/U in 3 months. If A1c much improved with Trulicity, may stop pioglitazone.  - POCT glycosylated hemoglobin (Hb A1C) - Dulaglutide (TRULICITY) 1.5 GQ/6.7YP SOPN; Inject  1.5 mg into the skin once a week.  Dispense: 6 mL; Refill: 1  3. Hypercholesteremia Stable. Continue Atorvastatin 20mg .   No follow-ups on file.      Reynolds Bowl, PA-C have reviewed all documentation for this visit. The documentation on 01/06/21 for the exam, diagnosis, procedures, and orders are all accurate and complete.   Rubye Beach  [facility] [phone] (phone) [phone] (fax)  [place]

## 2021-01-01 ENCOUNTER — Encounter: Payer: Self-pay | Admitting: Orthopedic Surgery

## 2021-01-01 NOTE — Progress Notes (Signed)
Office Visit Note   Patient: Jacqueline Sparks           Date of Birth: October 08, 1961           MRN: [id number] Visit Date: 12/27/2020              Requested by: Mar Daring, PA-C [address] [place],  [place] [id number] PCP: Mar Daring, PA-C  Chief Complaint  Patient presents with  . Right Shoulder - Follow-up      HPI: Patient seen in follow-up for rotator cuff pathology right shoulder as well as glenohumeral arthritis.  Had previously discussed the possibility of patient considering a total shoulder arthroplasty.  She complains of cracking and popping with range of motion.  She states the triggering of her finger is better but she has developed some pain over the outside of the right elbow.  Assessment & Plan: Visit Diagnoses:  1. Impingement syndrome of right shoulder   2. Trigger finger, right middle finger   3. Lateral epicondylitis, right elbow     Plan: Plan to follow-up in 4 weeks.  Recommend she could try Voltaren gel and hold on the ibuprofen.  Recommended tennis elbow strap for the lateral condyle-itis as well as Voltaren gel.  Follow-Up Instructions: Return if symptoms worsen or fail to improve.   Ortho Exam  Patient is alert, oriented, no adenopathy, well-dressed, normal affect, normal respiratory effort. Examination patient has active abduction flexion to 70 degrees on the right passively she has range of motion to 90 degrees there is no adhesive capsulitis there is crepitation and popping with range of motion passively.  There is pain with Neer and Hawkins impingement test.  Patient is point tender to palpation of the lateral condyle right elbow resisted extension of the wrist reproduces her pain.  Imaging: No results found. No images are attached to the encounter.  Labs: Lab Results  Component Value Date   HGBA1C 8.4 (A) 12/28/2020   HGBA1C 8.4 (A) 09/28/2020   HGBA1C 7.0 (A) 06/28/2020   LABURIC 5.3 04/10/2017     Lab  Results  Component Value Date   ALBUMIN 4.4 03/26/2020   ALBUMIN 4.2 03/03/2019   ALBUMIN 4.1 11/24/2016   LABURIC 5.3 04/10/2017    No results found for: MG Lab Results  Component Value Date   VD25OH 42.8 03/26/2020    No results found for: PREALBUMIN CBC EXTENDED Latest Ref Rng & Units 03/26/2020 03/03/2019 11/24/2016  WBC 3.4 - 10.8 x10E3/uL 7.5 7.5 8.6  RBC 3.77 - 5.28 x10E6/uL 5.06 4.62 4.95  HGB 11.1 - 15.9 g/dL 13.5 12.5 13.8  HCT 34.0 - 46.6 % 41.8 37.4 41.4  PLT 150 - 450 x10E3/uL 308 305 375  NEUTROABS 1.4 - 7.0 x10E3/uL 3.2 3.4 3.9  LYMPHSABS 0.7 - 3.1 x10E3/uL 3.7(H) 3.2(H) 4.1(H)     There is no height or weight on file to calculate BMI.  Orders:  No orders of the defined types were placed in this encounter.  No orders of the defined types were placed in this encounter.    Procedures: No procedures performed  Clinical Data: No additional findings.  ROS:  All other systems negative, except as noted in the HPI. Review of Systems  Objective: Vital Signs: There were no vitals taken for this visit.  Specialty Comments:  No specialty comments available.  PMFS History: Patient Active Problem List   Diagnosis Date Noted  . Psoriasis 06/01/2017  . Polyp of sigmoid colon   .  Benign neoplasm of descending colon   . Impingement syndrome of left shoulder 10/06/2016  . Impingement syndrome of right shoulder 10/06/2016  . BP (high blood pressure) 11/12/2015  . Abnormal abdominal MRI 11/09/2015  . Abnormal ECG 11/09/2015  . Anxiety 11/09/2015  . Obesity 01/30/2014  . Smoking 01/30/2014  . Avitaminosis D 05/03/2010  . Obstructive apnea 06/09/2007  . Hypercholesteremia 06/09/2007  . Compulsive tobacco user syndrome 06/09/2007  . Renal tubular disorder 05/19/2007  . Diabetes mellitus, type 2 ([place]) 06/04/2006   Past Medical History:  Diagnosis Date  . Anxiety   . Bronchitis   . Diabetes mellitus without complication ([place])   . GERD (gastroesophageal  reflux disease)   . Hyperlipidemia   . Sleep apnea     Family History  Problem Relation Age of Onset  . Hyperlipidemia Mother   . Hypertension Mother   . Diabetes Mother   . Diabetes Father   . Hyperlipidemia Father   . Hypertension Father     Past Surgical History:  Procedure Laterality Date  . CARPAL TUNNEL RELEASE  2001   as staated this was the left side, right side was completed in 2003  . COLONOSCOPY WITH PROPOFOL N/A 02/17/2017   Procedure: COLONOSCOPY WITH PROPOFOL;  Surgeon: Lucilla Lame, MD;  Location: [facility] ENDOSCOPY;  Service: Endoscopy;  Laterality: N/A;  . DILATION AND CURETTAGE OF UTERUS  2008   as stated uterine polyps  . ROTATOR CUFF REPAIR Right 3004 and 2010  . TUBAL LIGATION  1999   Social History   Occupational History  . Not on file  Tobacco Use  . Smoking status: Current Every Day Smoker    Packs/day: 1.00    Years: 30.00    Pack years: 30.00    Types: Cigarettes  . Smokeless tobacco: Never Used  Vaping Use  . Vaping Use: Never used  Substance and Sexual Activity  . Alcohol use: Yes    Comment: seldom use  . Drug use: No  . Sexual activity: Never

## 2021-01-06 ENCOUNTER — Encounter: Payer: Self-pay | Admitting: Physician Assistant

## 2021-01-07 ENCOUNTER — Telehealth: Payer: Self-pay

## 2021-01-07 NOTE — Telephone Encounter (Signed)
Called and lm on vm to advise need call back with start date of her partial disability in order to complete the forms that pt dropped off at last visit.

## 2021-01-16 NOTE — Telephone Encounter (Signed)
WILL HOLD FOR PT APPT.

## 2021-01-24 ENCOUNTER — Telehealth: Payer: Self-pay | Admitting: Orthopedic Surgery

## 2021-01-24 ENCOUNTER — Encounter: Payer: Self-pay | Admitting: Physician Assistant

## 2021-01-24 ENCOUNTER — Ambulatory Visit (INDEPENDENT_AMBULATORY_CARE_PROVIDER_SITE_OTHER): Admitting: Orthopedic Surgery

## 2021-01-24 DIAGNOSIS — M7541 Impingement syndrome of right shoulder: Secondary | ICD-10-CM

## 2021-01-24 DIAGNOSIS — F411 Generalized anxiety disorder: Secondary | ICD-10-CM | POA: Diagnosis not present

## 2021-01-24 DIAGNOSIS — F330 Major depressive disorder, recurrent, mild: Secondary | ICD-10-CM | POA: Diagnosis not present

## 2021-01-24 DIAGNOSIS — M7711 Lateral epicondylitis, right elbow: Secondary | ICD-10-CM | POA: Diagnosis not present

## 2021-01-24 NOTE — Progress Notes (Signed)
Office Visit Note   Patient: Jacqueline Sparks           Date of Birth: 1961-07-04           MRN: [id number] Visit Date: 01/24/2021              Requested by: Mar Daring, PA-C [address] [place] [place],  [place] [id number] PCP: Mar Daring, PA-C  Chief Complaint  Patient presents with  . Right Shoulder - Follow-up  . Right Elbow - Follow-up      HPI: Patient is a 60 year old woman who was seen in follow-up for chronic right shoulder and right elbow pain.  Patient states that she gets a little bit of relief with Voltaren gel and ibuprofen.  Assessment & Plan: Visit Diagnoses:  1. Impingement syndrome of right shoulder   2. Lateral epicondylitis, right elbow     Plan: Patient will continue with conservative therapy, activities as tolerated.  Follow-Up Instructions: Return if symptoms worsen or fail to improve.   Ortho Exam  Patient is alert, oriented, no adenopathy, well-dressed, normal affect, normal respiratory effort. Examination patient has full passive range of motion of the right elbow she is point tender to palpation of the lateral epicondyle which is tender to palpation.  Patient has pain with resisted extension of the wrist at the elbow.  Examination the shoulder she has good passive range of motion she has pain with Neer and Hawkins impingement test.  Imaging: No results found. No images are attached to the encounter.  Labs: Lab Results  Component Value Date   HGBA1C 8.4 (A) 12/28/2020   HGBA1C 8.4 (A) 09/28/2020   HGBA1C 7.0 (A) 06/28/2020   LABURIC 5.3 04/10/2017     Lab Results  Component Value Date   ALBUMIN 4.4 03/26/2020   ALBUMIN 4.2 03/03/2019   ALBUMIN 4.1 11/24/2016    No results found for: MG Lab Results  Component Value Date   VD25OH 42.8 03/26/2020    No results found for: PREALBUMIN CBC EXTENDED Latest Ref Rng & Units 03/26/2020 03/03/2019 11/24/2016  WBC 3.4 - 10.8 x10E3/uL 7.5 7.5 8.6  RBC 3.77 - 5.28 x10E6/uL  5.06 4.62 4.95  HGB 11.1 - 15.9 g/dL 13.5 12.5 13.8  HCT 34.0 - 46.6 % 41.8 37.4 41.4  PLT 150 - 450 x10E3/uL 308 305 375  NEUTROABS 1.4 - 7.0 x10E3/uL 3.2 3.4 3.9  LYMPHSABS 0.7 - 3.1 x10E3/uL 3.7(H) 3.2(H) 4.1(H)     There is no height or weight on file to calculate BMI.  Orders:  No orders of the defined types were placed in this encounter.  No orders of the defined types were placed in this encounter.    Procedures: No procedures performed  Clinical Data: No additional findings.  ROS:  All other systems negative, except as noted in the HPI. Review of Systems  Objective: Vital Signs: There were no vitals taken for this visit.  Specialty Comments:  No specialty comments available.  PMFS History: Patient Active Problem List   Diagnosis Date Noted  . Psoriasis 06/01/2017  . Polyp of sigmoid colon   . Benign neoplasm of descending colon   . Impingement syndrome of left shoulder 10/06/2016  . Impingement syndrome of right shoulder 10/06/2016  . BP (high blood pressure) 11/12/2015  . Abnormal abdominal MRI 11/09/2015  . Abnormal ECG 11/09/2015  . Anxiety 11/09/2015  . Obesity 01/30/2014  . Smoking 01/30/2014  . Avitaminosis D 05/03/2010  . Obstructive apnea 06/09/2007  .  Hypercholesteremia 06/09/2007  . Compulsive tobacco user syndrome 06/09/2007  . Renal tubular disorder 05/19/2007  . Diabetes mellitus, type 2 ([place]) 06/04/2006   Past Medical History:  Diagnosis Date  . Anxiety   . Bronchitis   . Diabetes mellitus without complication ([place])   . GERD (gastroesophageal reflux disease)   . Hyperlipidemia   . Sleep apnea     Family History  Problem Relation Age of Onset  . Hyperlipidemia Mother   . Hypertension Mother   . Diabetes Mother   . Diabetes Father   . Hyperlipidemia Father   . Hypertension Father     Past Surgical History:  Procedure Laterality Date  . CARPAL TUNNEL RELEASE  2001   as staated this was the left side, right side was  completed in 2003  . COLONOSCOPY WITH PROPOFOL N/A 02/17/2017   Procedure: COLONOSCOPY WITH PROPOFOL;  Surgeon: Lucilla Lame, MD;  Location: [facility] ENDOSCOPY;  Service: Endoscopy;  Laterality: N/A;  . DILATION AND CURETTAGE OF UTERUS  2008   as stated uterine polyps  . ROTATOR CUFF REPAIR Right 3004 and 2010  . TUBAL LIGATION  1999   Social History   Occupational History  . Not on file  Tobacco Use  . Smoking status: Current Every Day Smoker    Packs/day: 1.00    Years: 30.00    Pack years: 30.00    Types: Cigarettes  . Smokeless tobacco: Never Used  Vaping Use  . Vaping Use: Never used  Substance and Sexual Activity  . Alcohol use: Yes    Comment: seldom use  . Drug use: No  . Sexual activity: Never

## 2021-01-24 NOTE — Telephone Encounter (Signed)
Patient submitted medical release form, disability continuation claim, and $25.00 cash payment to [employer]. Accepted 01/24/21

## 2021-01-31 DIAGNOSIS — F411 Generalized anxiety disorder: Secondary | ICD-10-CM | POA: Diagnosis not present

## 2021-01-31 DIAGNOSIS — F330 Major depressive disorder, recurrent, mild: Secondary | ICD-10-CM | POA: Diagnosis not present

## 2021-02-07 ENCOUNTER — Telehealth: Payer: Self-pay | Admitting: Physical Medicine and Rehabilitation

## 2021-02-07 DIAGNOSIS — F411 Generalized anxiety disorder: Secondary | ICD-10-CM | POA: Diagnosis not present

## 2021-02-07 DIAGNOSIS — F330 Major depressive disorder, recurrent, mild: Secondary | ICD-10-CM | POA: Diagnosis not present

## 2021-02-07 NOTE — Telephone Encounter (Signed)
Pt states that she would like to be looked at about the same spot as last time but the right side. CB [phone]

## 2021-02-11 NOTE — Telephone Encounter (Signed)
Pt lasted was on 11/19/20 for Right L5-S1 IL, pt called to get inj on her left. OV or sch inj?

## 2021-02-11 NOTE — Telephone Encounter (Signed)
Yes ok 

## 2021-02-12 NOTE — Telephone Encounter (Signed)
Called pt and sch 5/4

## 2021-02-14 DIAGNOSIS — F411 Generalized anxiety disorder: Secondary | ICD-10-CM | POA: Diagnosis not present

## 2021-02-14 DIAGNOSIS — F330 Major depressive disorder, recurrent, mild: Secondary | ICD-10-CM | POA: Diagnosis not present

## 2021-02-20 ENCOUNTER — Ambulatory Visit: Payer: Disability Insurance | Admitting: Physical Medicine and Rehabilitation

## 2021-02-21 ENCOUNTER — Ambulatory Visit (INDEPENDENT_AMBULATORY_CARE_PROVIDER_SITE_OTHER): Admitting: Orthopedic Surgery

## 2021-02-21 DIAGNOSIS — M7541 Impingement syndrome of right shoulder: Secondary | ICD-10-CM | POA: Diagnosis not present

## 2021-02-21 DIAGNOSIS — F411 Generalized anxiety disorder: Secondary | ICD-10-CM | POA: Diagnosis not present

## 2021-02-21 DIAGNOSIS — F330 Major depressive disorder, recurrent, mild: Secondary | ICD-10-CM | POA: Diagnosis not present

## 2021-02-25 ENCOUNTER — Encounter: Payer: Self-pay | Admitting: Orthopedic Surgery

## 2021-02-25 NOTE — Progress Notes (Signed)
Office Visit Note   Patient: Jacqueline Sparks           Date of Birth: 11-15-1960           MRN: [id number] Visit Date: 02/21/2021              Requested by: Mar Daring, PA-C [address] [place],  [place] [address] PCP: Mar Daring, PA-C  Chief Complaint  Patient presents with  . Right Shoulder - Follow-up      HPI: Patient is a 60 year old woman who is seen in follow-up for chronic impingement symptoms of her right shoulder.  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  She states she is also applying for permanent disability with DSS.  Assessment & Plan: Visit Diagnoses:  1. Impingement syndrome of right shoulder     Plan: Patient will continue with her current for disability no indication for intervention at this time.  Follow-Up Instructions: Return if symptoms worsen or fail to improve.   Ortho Exam  Patient is alert, oriented, no adenopathy, well-dressed, normal affect, normal respiratory effort. Examination patient has pain with active range of motion of her right shoulder abduction and flexion to 90 degrees pain with Neer and Hawkins impingement test.  Hemoglobin A1c has been consistently at 8.4  Imaging: No results found. No images are attached to the encounter.  Labs: Lab Results  Component Value Date   HGBA1C 8.4 (A) 12/28/2020   HGBA1C 8.4 (A) 09/28/2020   HGBA1C 7.0 (A) 06/28/2020   LABURIC 5.3 04/10/2017     Lab Results  Component Value Date   ALBUMIN 4.4 03/26/2020   ALBUMIN 4.2 03/03/2019   ALBUMIN 4.1 11/24/2016    No results found for: MG Lab Results  Component Value Date   VD25OH 42.8 03/26/2020    No results found for: PREALBUMIN CBC EXTENDED Latest Ref Rng & Units 03/26/2020 03/03/2019 11/24/2016  WBC 3.4 - 10.8 x10E3/uL 7.5 7.5 8.6  RBC 3.77 - 5.28 x10E6/uL 5.06 4.62 4.95  HGB 11.1 - 15.9 g/dL 13.5 12.5 13.8  HCT 34.0 - 46.6 % 41.8 37.4 41.4  PLT 150 - 450 x10E3/uL 308 305 375   NEUTROABS 1.4 - 7.0 x10E3/uL 3.2 3.4 3.9  LYMPHSABS 0.7 - 3.1 x10E3/uL 3.7(H) 3.2(H) 4.1(H)     There is no height or weight on file to calculate BMI.  Orders:  No orders of the defined types were placed in this encounter.  No orders of the defined types were placed in this encounter.    Procedures: No procedures performed  Clinical Data: No additional findings.  ROS:  All other systems negative, except as noted in the HPI. Review of Systems  Objective: Vital Signs: There were no vitals taken for this visit.  Specialty Comments:  No specialty comments available.  PMFS History: Patient Active Problem List   Diagnosis Date Noted  . Psoriasis 06/01/2017  . Polyp of sigmoid colon   . Benign neoplasm of descending colon   . Impingement syndrome of left shoulder 10/06/2016  . Impingement syndrome of right shoulder 10/06/2016  . BP (high blood pressure) 11/12/2015  . Abnormal abdominal MRI 11/09/2015  . Abnormal ECG 11/09/2015  . Anxiety 11/09/2015  . Obesity 01/30/2014  . Smoking 01/30/2014  . Avitaminosis D 05/03/2010  . Obstructive apnea 06/09/2007  . Hypercholesteremia 06/09/2007  . Compulsive tobacco user syndrome 06/09/2007  . Renal tubular disorder 05/19/2007  . Diabetes mellitus, type 2 ([place]) 06/04/2006  Past Medical History:  Diagnosis Date  . Anxiety   . Bronchitis   . Diabetes mellitus without complication ([place])   . GERD (gastroesophageal reflux disease)   . Hyperlipidemia   . Sleep apnea     Family History  Problem Relation Age of Onset  . Hyperlipidemia Mother   . Hypertension Mother   . Diabetes Mother   . Diabetes Father   . Hyperlipidemia Father   . Hypertension Father     Past Surgical History:  Procedure Laterality Date  . CARPAL TUNNEL RELEASE  2001   as staated this was the left side, right side was completed in 2003  . COLONOSCOPY WITH PROPOFOL N/A 02/17/2017   Procedure: COLONOSCOPY WITH PROPOFOL;  Surgeon: Lucilla Lame, MD;   Location: [facility] ENDOSCOPY;  Service: Endoscopy;  Laterality: N/A;  . DILATION AND CURETTAGE OF UTERUS  2008   as stated uterine polyps  . ROTATOR CUFF REPAIR Right 3004 and 2010  . TUBAL LIGATION  1999   Social History   Occupational History  . Not on file  Tobacco Use  . Smoking status: Current Every Day Smoker    Packs/day: 1.00    Years: 30.00    Pack years: 30.00    Types: Cigarettes  . Smokeless tobacco: Never Used  Vaping Use  . Vaping Use: Never used  Substance and Sexual Activity  . Alcohol use: Yes    Comment: seldom use  . Drug use: No  . Sexual activity: Never

## 2021-02-28 DIAGNOSIS — F330 Major depressive disorder, recurrent, mild: Secondary | ICD-10-CM | POA: Diagnosis not present

## 2021-02-28 DIAGNOSIS — F411 Generalized anxiety disorder: Secondary | ICD-10-CM | POA: Diagnosis not present

## 2021-03-14 DIAGNOSIS — F330 Major depressive disorder, recurrent, mild: Secondary | ICD-10-CM | POA: Diagnosis not present

## 2021-03-14 DIAGNOSIS — F411 Generalized anxiety disorder: Secondary | ICD-10-CM | POA: Diagnosis not present

## 2021-03-21 ENCOUNTER — Ambulatory Visit (INDEPENDENT_AMBULATORY_CARE_PROVIDER_SITE_OTHER): Admitting: Physician Assistant

## 2021-03-21 ENCOUNTER — Encounter: Payer: Self-pay | Admitting: Orthopedic Surgery

## 2021-03-21 VITALS — Ht 63.0 in | Wt 160.0 lb

## 2021-03-21 DIAGNOSIS — F411 Generalized anxiety disorder: Secondary | ICD-10-CM | POA: Diagnosis not present

## 2021-03-21 DIAGNOSIS — M7542 Impingement syndrome of left shoulder: Secondary | ICD-10-CM | POA: Diagnosis not present

## 2021-03-21 DIAGNOSIS — F330 Major depressive disorder, recurrent, mild: Secondary | ICD-10-CM | POA: Diagnosis not present

## 2021-03-21 NOTE — Progress Notes (Signed)
Office Visit Note   Patient: Jacqueline Sparks           Date of Birth: Jun 13, 1961           MRN: [id number] Visit Date: 03/21/2021              Requested by: Mar Daring, PA-C [address] [place],  [place] [id number] PCP: Mar Daring, PA-C  Chief Complaint  Patient presents with  . 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  Plan: Follow-up 1 month  Follow-Up Instructions: No follow-ups on file.   Ortho Exam  Patient is alert, oriented, no adenopathy, well-dressed, normal affect, normal respiratory effort. Examination she has pain with active range of motion of her right shoulder abduction and flexion to 90 degrees positive Neer's and Hawkins impingement test she cannot do her internal rotation behind her back.  She is right-hand dominant  Imaging: No results found. No images are attached to the encounter.  Labs: Lab Results  Component Value Date   HGBA1C 8.4 (A) 12/28/2020   HGBA1C 8.4 (A) 09/28/2020   HGBA1C 7.0 (A) 06/28/2020   LABURIC 5.3 04/10/2017     Lab Results  Component Value Date   ALBUMIN 4.4 03/26/2020   ALBUMIN 4.2 03/03/2019   ALBUMIN 4.1 11/24/2016    No results found for: MG Lab Results  Component Value Date   VD25OH 42.8 03/26/2020    No results found for: PREALBUMIN CBC EXTENDED Latest Ref Rng & Units 03/26/2020 03/03/2019 11/24/2016  WBC 3.4 - 10.8 x10E3/uL 7.5 7.5 8.6  RBC 3.77 - 5.28 x10E6/uL 5.06 4.62 4.95  HGB 11.1 - 15.9 g/dL 13.5 12.5 13.8  HCT 34.0 - 46.6 % 41.8 37.4 41.4  PLT 150 - 450 x10E3/uL 308 305 375  NEUTROABS 1.4 - 7.0 x10E3/uL 3.2 3.4 3.9  LYMPHSABS 0.7 - 3.1 x10E3/uL 3.7(H)  3.2(H) 4.1(H)     Body mass index is 28.34 kg/m.  Orders:  No orders of the defined types were placed in this encounter.  No orders of the defined types were placed in this encounter.    Procedures: No procedures performed  Clinical Data: No additional findings.  ROS:  All other systems negative, except as noted in the HPI. Review of Systems  Objective: Vital Signs: Ht 5\' 3"  (1.6 m)   Wt 160 lb (72.6 kg)   BMI 28.34 kg/m   Specialty Comments:  No specialty comments available.  PMFS History: Patient Active Problem List   Diagnosis Date Noted  . Psoriasis 06/01/2017  . Polyp of sigmoid colon   . Benign neoplasm of descending colon   . Impingement syndrome of left shoulder 10/06/2016  . Impingement syndrome of right shoulder 10/06/2016  . BP (high blood pressure) 11/12/2015  . Abnormal abdominal MRI 11/09/2015  . Abnormal ECG 11/09/2015  . Anxiety 11/09/2015  . Obesity 01/30/2014  . Smoking 01/30/2014  . Avitaminosis D 05/03/2010  . Obstructive apnea 06/09/2007  . Hypercholesteremia 06/09/2007  . Compulsive tobacco user syndrome 06/09/2007  . Renal tubular disorder 05/19/2007  . Diabetes mellitus, type 2 ([place]) 06/04/2006   Past Medical History:  Diagnosis Date  .  Anxiety   . Bronchitis   . Diabetes mellitus without complication ([place])   . GERD (gastroesophageal reflux disease)   . Hyperlipidemia   . Sleep apnea     Family History  Problem Relation Age of Onset  . Hyperlipidemia Mother   . Hypertension Mother   . Diabetes Mother   . Diabetes Father   . Hyperlipidemia Father   . Hypertension Father     Past Surgical History:  Procedure Laterality Date  . CARPAL TUNNEL RELEASE  2001   as staated this was the left side, right side was completed in 2003  . COLONOSCOPY WITH PROPOFOL N/A 02/17/2017   Procedure: COLONOSCOPY WITH PROPOFOL;  Surgeon: Lucilla Lame, MD;  Location: [facility] ENDOSCOPY;  Service: Endoscopy;  Laterality: N/A;  . DILATION AND  CURETTAGE OF UTERUS  2008   as stated uterine polyps  . ROTATOR CUFF REPAIR Right 3004 and 2010  . TUBAL LIGATION  1999   Social History   Occupational History  . Not on file  Tobacco Use  . Smoking status: Current Every Day Smoker    Packs/day: 1.00    Years: 30.00    Pack years: 30.00    Types: Cigarettes  . Smokeless tobacco: Never Used  Vaping Use  . Vaping Use: Never used  Substance and Sexual Activity  . Alcohol use: Yes    Comment: seldom use  . Drug use: No  . Sexual activity: Never

## 2021-03-27 DIAGNOSIS — G4733 Obstructive sleep apnea (adult) (pediatric): Secondary | ICD-10-CM | POA: Diagnosis not present

## 2021-04-04 DIAGNOSIS — F330 Major depressive disorder, recurrent, mild: Secondary | ICD-10-CM | POA: Diagnosis not present

## 2021-04-04 DIAGNOSIS — F411 Generalized anxiety disorder: Secondary | ICD-10-CM | POA: Diagnosis not present

## 2021-04-09 ENCOUNTER — Ambulatory Visit: Payer: Federal, State, Local not specified - PPO | Admitting: Family Medicine

## 2021-04-09 ENCOUNTER — Encounter: Payer: Self-pay | Admitting: Family Medicine

## 2021-04-09 ENCOUNTER — Other Ambulatory Visit: Payer: Self-pay

## 2021-04-09 VITALS — BP 149/77 | HR 75 | Temp 97.5°F | Resp 18 | Wt 159.0 lb

## 2021-04-09 DIAGNOSIS — E119 Type 2 diabetes mellitus without complications: Secondary | ICD-10-CM

## 2021-04-09 DIAGNOSIS — L409 Psoriasis, unspecified: Secondary | ICD-10-CM | POA: Diagnosis not present

## 2021-04-09 DIAGNOSIS — E7800 Pure hypercholesterolemia, unspecified: Secondary | ICD-10-CM

## 2021-04-09 DIAGNOSIS — J418 Mixed simple and mucopurulent chronic bronchitis: Secondary | ICD-10-CM | POA: Diagnosis not present

## 2021-04-09 LAB — POCT GLYCOSYLATED HEMOGLOBIN (HGB A1C)
Est. average glucose Bld gHb Est-mCnc: 169
Hemoglobin A1C: 7.5 % — AB (ref 4.0–5.6)

## 2021-04-09 LAB — POCT UA - MICROALBUMIN: Microalbumin Ur, POC: NEGATIVE mg/L

## 2021-04-09 MED ORDER — ANORO ELLIPTA 62.5-25 MCG/INH IN AEPB: 1.0000 | INHALATION_SPRAY | Freq: Every day | RESPIRATORY_TRACT | 12 refills | Status: AC

## 2021-04-09 MED ORDER — ATORVASTATIN CALCIUM 20 MG PO TABS: 1.0000 | ORAL_TABLET | Freq: Every day | ORAL | 4 refills | Status: AC

## 2021-04-09 MED ORDER — TRIAMCINOLONE ACETONIDE 0.5 % EX CREA: 1.0000 "application " | TOPICAL_CREAM | Freq: Three times a day (TID) | CUTANEOUS | 2 refills | Status: AC

## 2021-04-09 NOTE — Progress Notes (Signed)
Established patient visit   Patient: Jacqueline Sparks   DOB: [birth year]   60 y.o. Female  MRN: [id number] Visit Date: 04/09/2021  Today's healthcare provider: Lelon Huh, MD   Chief Complaint  Patient presents with   Diabetes   Hyperlipidemia   Subjective    HPI  Diabetes Mellitus Type II, Follow-up  Lab Results  Component Value Date   HGBA1C 7.5 (A) 04/09/2021   HGBA1C 8.4 (A) 12/28/2020   HGBA1C 8.4 (A) 09/28/2020   Wt Readings from Last 3 Encounters:  04/09/21 159 lb (72.1 kg)  03/21/21 160 lb (72.6 kg)  12/28/20 160 lb (72.6 kg)   Last seen for diabetes on 12/28/2020 (seen by Fenton Malling, PA-C).   Management since then includes adding Trulicity. Patient advised to continue Pioglitazone 45mg , Januvia 100mg , glipizide 10mg  BID. F/U in 3 months. If A1c much improved with Trulicity, may stop pioglitazone. She reports good compliance with treatment. She is not having side effects.  Symptoms: Yes fatigue No foot ulcerations  No appetite changes No nausea  No paresthesia of the feet  Yes polydipsia  Yes polyuria No visual disturbances   No vomiting     Home blood sugar records:  blood sugars are not checked  Episodes of hypoglycemia? No    Current insulin regiment: none Most Recent Eye Exam: not UTD Current exercise: none Current diet habits: well balanced  Pertinent Labs: Lab Results  Component Value Date   CHOL 155 03/26/2020   HDL 30 (L) 03/26/2020   LDLCALC 91 03/26/2020   TRIG 198 (H) 03/26/2020   CHOLHDL 5.3 (H) 03/03/2019   Lab Results  Component Value Date   NA 143 03/26/2020   K 4.3 03/26/2020   CREATININE 0.76 03/26/2020   GFRNONAA 87 03/26/2020   GFRAA 101 03/26/2020   GLUCOSE 101 (H) 03/26/2020     ---------------------------------------------------------------------------------------------------   Lipid/Cholesterol, Follow-up  Last lipid panel Other pertinent labs  Lab Results  Component Value Date   CHOL 155 03/26/2020    HDL 30 (L) 03/26/2020   LDLCALC 91 03/26/2020   TRIG 198 (H) 03/26/2020   CHOLHDL 5.3 (H) 03/03/2019   Lab Results  Component Value Date   ALT 15 03/26/2020   AST 21 03/26/2020   PLT 308 03/26/2020   TSH 0.971 03/26/2020     She was last seen for this 3 months ago.  Management since that visit includes continuing Atorvastatin.  She reports good compliance with treatment. She is not having side effects.   Symptoms: No chest pain No chest pressure/discomfort  No dyspnea No lower extremity edema  No numbness or tingling of extremity No orthopnea  No palpitations No paroxysmal nocturnal dyspnea  No speech difficulty No syncope   Current diet: well balanced Current exercise: none  The 10-year ASCVD risk score Mikey Bussing DC Jr., et al., 2013) is: 34.2%  ---------------------------------------------------------------------------------------------------      Medications: Outpatient Medications Prior to Visit  Medication Sig   ALPRAZolam (XANAX) 0.5 MG tablet Take 1 tablet (0.5 mg total) by mouth 2 (two) times daily as needed for anxiety.   atorvastatin (LIPITOR) 20 MG tablet Take 1 tablet by mouth once daily   clobetasol ointment (TEMOVATE) 6.06 % Apply 1 application topically 2 (two) times daily.   Dulaglutide (TRULICITY) 1.5 TK/1.6WF SOPN Inject 1.5 mg into the skin once a week.   glipiZIDE (GLUCOTROL) 10 MG tablet Take 1 tablet (10 mg total) by mouth 2 (two) times daily before a meal. (Patient taking  differently: Take 10 mg by mouth daily before breakfast.)   mometasone (ELOCON) 0.1 % cream Apply 1 application topically daily.   NON FORMULARY CPAP   ONE TOUCH ULTRA TEST test strip USE ONE STRIP TO CHECK GLUCOSE ONCE DAILY   pioglitazone (ACTOS) 45 MG tablet Take 1 tablet by mouth once daily   umeclidinium-vilanterol (ANORO ELLIPTA) 62.5-25 MCG/INH AEPB Inhale 1 puff into the lungs daily.   JANUVIA 100 MG tablet Take 1 tablet by mouth once daily (Patient not taking: Reported on  04/09/2021)   No facility-administered medications prior to visit.    Review of Systems  Constitutional:  Negative for appetite change, chills, fatigue and fever.  Respiratory:  Negative for chest tightness and shortness of breath.   Cardiovascular:  Negative for chest pain and palpitations.  Gastrointestinal:  Negative for abdominal pain, nausea and vomiting.  Neurological:  Negative for dizziness and weakness.      Objective    BP (!) 149/77 (BP Location: Right Arm, Patient Position: Sitting, Cuff Size: Normal)   Pulse 75   Temp (!) 97.5 F (36.4 C) (Temporal)   Resp 18   Wt 159 lb (72.1 kg)   BMI 28.17 kg/m     Physical Exam  General: Appearance:     Well developed, well nourished female in no acute distress  Eyes:    PERRL, conjunctiva/corneas clear, EOM's intact       Lungs:     Clear to auscultation bilaterally, respirations unlabored  Heart:    Normal heart rate. Normal rhythm. No murmurs, rubs, or gallops.    MS:   All extremities are intact.    Neurologic:   Awake, alert, oriented x 3. No apparent focal neurological           defect.         Results for orders placed or performed in visit on 04/09/21  POCT HgB A1C  Result Value Ref Range   Hemoglobin A1C 7.5 (A) 4.0 - 5.6 %   Est. average glucose Bld gHb Est-mCnc 169   POCT UA - Microalbumin  Result Value Ref Range   Microalbumin Ur, POC negative mg/L    Assessment & Plan     1. Type 2 diabetes mellitus without complication, without long-term current use of insulin (HCC) Much better with increased dose of Trulicity. She has already reduced glipizide to one daily and discontinued Januvia. Will recheck in 3-4 months. Anticipated reducing glipizide and possibly increasing Trulicity at follow up.   2. Hypercholesteremia refill atorvastatin (LIPITOR) 20 MG tablet; Take 1 tablet (20 mg total) by mouth daily.  Dispense: 90 tablet; Refill: 4  3. Psoriasis She would like to try a different steroid cream.  -  triamcinolone cream (KENALOG) 0.5 %; Apply 1 application topically 3 (three) times daily.  Dispense: 60 g; Refill: 2  4. Mixed simple and mucopurulent chronic bronchitis (HCC) refill umeclidinium-vilanterol (ANORO ELLIPTA) 62.5-25 MCG/INH AEPB; Inhale 1 puff into the lungs daily.  Dispense: 60 each; Refill: 12         The entirety of the information documented in the History of Present Illness, Review of Systems and Physical Exam were personally obtained by me. Portions of this information were initially documented by the CMA and reviewed by me for thoroughness and accuracy.     Lelon Huh, MD  [facility] [phone] (phone) [phone] (fax)  [place]

## 2021-04-16 ENCOUNTER — Encounter: Payer: Self-pay | Admitting: Physical Medicine and Rehabilitation

## 2021-04-16 NOTE — Progress Notes (Signed)
Jacqueline Sparks - 60 y.o. female MRN [id number]  Date of birth: July 02, 1961  Office Visit Note: Visit Date: 11/13/2020 PCP: Mar Daring, PA-C Referred by: Florian Buff*  Subjective: Chief Complaint  Patient presents with   Lower Back - Pain   Right Leg - Pain, Weakness, Numbness   HPI: Jacqueline Sparks is a 60 y.o. female who comes in today At the request of Dr. Meridee Score for evaluation and management of chronic worsening low back pain with some referral pain in the right hip and anterior right thigh and leg.  I have actually seen the patient in the remote past.  She is also been followed by Dr. Sharol Given for right shoulder pain.  She reports 7 out of 10 pain on average which is quite debilitating and does affect her activities of daily living and is affecting her enjoyment of life at this point.  She reports this is a constant dull and stabbing pain particularly in the right hip and leg.  She reports no focal weakness but does feel like her hip and leg are going to give out when she moves upon taking a step.  She reports standing and walking are definitely worse than sitting.  Sitting and then standing is problematic.  She gets some relief with rest heat ice and medications.  She denies any again focal weakness no bowel or bladder changes no other red flag complaints of fever chills or night sweats or focal night pain.  Her case is complicated by diabetes with last hemoglobin A1c above 8.  She has had physical therapy for her shoulder and her back and continues with some home exercises but finds it difficult.  She does try to walk and stay active.  She has not had recent images of the lumbar spine and those were completed today as well as images of the left hip.  She did have prior MRI of the lumbar spine in 2014 and I did review that with her today it is reviewed in the notes below.  MRI did not show any high-grade stenosis but did show foraminal narrowing at L5-S1 particularly on the left.   The foraminal narrowing was pretty severe at the time and that was years ago.  Plain x-rays today do not show anything out of the ordinary this change since that time from a bony standpoint.  There is still foraminal narrowing at L5-S1 with facet arthropathy but there is no listhesis and disc heights are fairly well-maintained.  Her hips actually look very mild in terms of degenerative change.  Dr. Sharol Given had also evaluated her hip clinically.  Review of Systems  Musculoskeletal:  Positive for back pain and joint pain.  All other systems reviewed and are negative. Otherwise per HPI.  Assessment & Plan: Visit Diagnoses:    ICD-10-CM   1. Chronic bilateral low back pain with right-sided sciatica  M54.41 XR Lumbar Spine Complete   G89.29     2. Lumbar radiculopathy  M54.16 XR Lumbar Spine Complete    3. Pain in right hip  M25.551 XR HIP UNILAT W OR W/O PELVIS 1V RIGHT       Plan: Findings:  Chronic, severe and worsening over the last many months but years of back pain.  Pain is quite severe and limiting at this point despite conservative care of medications and exercise and activity modification.  Old MRI findings of pretty severe foraminal narrowing at L5-S1.  X-rays pretty unrevealing today.  No red flag complaints.  I do think diagnostically completing epidural injection from an interlaminar approach at L5-S1 with fluoroscopic guidance would be warranted to do the amount of pain she is having.  If that works very well then she can watch this and continue to follow with Dr. Sharol Given for orthopedic complaints.  If it does well but does not last longer does not help very much then I do think we need to update the MRI of the lumbar spine.  She could have a disc protrusion that was not evident on the old MRI at a higher level such as L2-3 giving some of this anterior thigh pain.   Meds & Orders: No orders of the defined types were placed in this encounter.   Orders Placed This Encounter  Procedures    XR Lumbar Spine Complete   XR HIP UNILAT W OR W/O PELVIS 1V RIGHT    Follow-up: Return for Right L5-S1 interlaminar pleural steroid injection.   Procedures: No procedures performed      Clinical History: MRI LUMBAR SPINE WITHOUT CONTRAST   TECHNIQUE:  Multiplanar, multisequence MR imaging was performed. No intravenous  contrast was administered.   COMPARISON:  Lumbar MRI 09/05/2011   FINDINGS:  Mild right-sided hydronephrosis unchanged from the prior study.  Question right UPJ stricture.   Normal lumbar alignment. Negative for fracture or mass. No bone  marrow edema. Conus medullaris is normal and terminates at mid L2.   L1-2:  Negative   L2-3:  Negative   L3-4: Small left lateral disc protrusion which could cause  irritation of the left L3 nerve root. This has developed since the  prior MRI.   L4-5: Small left foraminal and left lateral disc protrusion with  mild associated spurring. This could cause impingement of the left  L4 nerve root in the foramen. This finding is slightly more  prominent than on the prior study. Mild facet hypertrophy  bilaterally. Spinal canal is adequate in size.   L5-S1: Disc degeneration and spondylosis. There is moderate to  marked right foraminal encroachment with impingement of the right L5  nerve root, similar to the prior study.   IMPRESSION:  Small left lateral disc protrusion L3-4, new finding.   Left foraminal disc protrusion L4-5 has progressed and may be  causing impingement of the left L4 nerve root.   Marked right foraminal encroachment and impingement of the right L5  nerve root is unchanged.   Mild right hydronephrosis, unchanged.    Electronically Signed    By: Franchot Gallo M.D.    On: 09/05/2013 15:27   She reports that she has been smoking cigarettes. She has a 30.00 pack-year smoking history. She has never used smokeless tobacco.  Recent Labs    09/28/20 1108 12/28/20 1012 04/09/21 1007  HGBA1C 8.4* 8.4*  7.5*    Objective:  VS:  HT:    WT:   BMI:     BP:131/81  HR:81bpm  TEMP: ( )  RESP:  Physical Exam Vitals and nursing note reviewed.  Constitutional:      General: She is not in acute distress.    Appearance: Normal appearance. She is not ill-appearing.  HENT:     Head: Normocephalic and atraumatic.     Right Ear: External ear normal.     Left Ear: External ear normal.  Eyes:     Extraocular Movements: Extraocular movements intact.  Cardiovascular:     Rate and Rhythm: Normal rate.     Pulses: Normal pulses.  Pulmonary:  Effort: Pulmonary effort is normal. No respiratory distress.  Abdominal:     General: There is no distension.     Palpations: Abdomen is soft.  Musculoskeletal:        General: Tenderness present.     Cervical back: Neck supple.     Right lower leg: No edema.     Left lower leg: No edema.     Comments: Patient has good distal strength with no pain over the greater trochanters.  No clonus or focal weakness.  She has good strength with hip flexion and knee flexion extension.  She has pain with extension of the lumbar spine and facet loading.  She has no specific active trigger points she is tender however over the PSIS left more than right.  She has no real pain with hip rotation although she has some pain at end ranges.  Skin:    Findings: No erythema, lesion or rash.  Neurological:     General: No focal deficit present.     Mental Status: She is alert and oriented to person, place, and time.     Sensory: No sensory deficit.     Motor: No weakness or abnormal muscle tone.     Coordination: Coordination normal.  Psychiatric:        Mood and Affect: Mood normal.        Behavior: Behavior normal.    Ortho Exam  Imaging: 4 view lumbar spine dated today: Complete 4 view lumbar spine x-ray shows fairly normal anatomic alignment with degenerative facet hypertrophy at L5-S1 with some biforaminal narrowing but good disc heights overall without any  listhesis.  No spondylolysis.  Sacroiliac joints without sclerosis.  AP pelvis and left hip:AP pelvis and left hip once again shows normal equal height of the pelvis with no sclerosing or changes of the SI joints.  No fractures or dislocations.  Mild degenerative joint changes left equal to right.  Past Medical/Family/Surgical/Social History: Medications & Allergies reviewed per EMR, new medications updated. Patient Active Problem List   Diagnosis Date Noted   Psoriasis 06/01/2017   Polyp of sigmoid colon    Benign neoplasm of descending colon    Impingement syndrome of left shoulder 10/06/2016   Impingement syndrome of right shoulder 10/06/2016   BP (high blood pressure) 11/12/2015   Abnormal abdominal MRI 11/09/2015   Abnormal ECG 11/09/2015   Anxiety 11/09/2015   Obesity 01/30/2014   Smoking 01/30/2014   Avitaminosis D 05/03/2010   Obstructive apnea 06/09/2007   Hypercholesteremia 06/09/2007   Compulsive tobacco user syndrome 06/09/2007   Renal tubular disorder 05/19/2007   Diabetes mellitus, type 2 ([place]) 06/04/2006   Past Medical History:  Diagnosis Date   Anxiety    Bronchitis    Diabetes mellitus without complication (HCC)    GERD (gastroesophageal reflux disease)    Hyperlipidemia    Sleep apnea    Family History  Problem Relation Age of Onset   Hyperlipidemia Mother    Hypertension Mother    Diabetes Mother    Diabetes Father    Hyperlipidemia Father    Hypertension Father    Past Surgical History:  Procedure Laterality Date   CARPAL TUNNEL RELEASE  2001   as staated this was the left side, right side was completed in 2003   COLONOSCOPY WITH PROPOFOL N/A 02/17/2017   Procedure: COLONOSCOPY WITH PROPOFOL;  Surgeon: Lucilla Lame, MD;  Location: [facility] ENDOSCOPY;  Service: Endoscopy;  Laterality: N/A;   DILATION AND CURETTAGE OF UTERUS  2008  as stated uterine polyps   ROTATOR CUFF REPAIR Right 3004 and 2010   TUBAL LIGATION  1999   Social History    Occupational History   Not on file  Tobacco Use   Smoking status: Every Day    Packs/day: 1.00    Years: 30.00    Pack years: 30.00    Types: Cigarettes   Smokeless tobacco: Never  Vaping Use   Vaping Use: Never used  Substance and Sexual Activity   Alcohol use: Yes    Comment: seldom use   Drug use: No   Sexual activity: Never

## 2021-05-25 DIAGNOSIS — F330 Major depressive disorder, recurrent, mild: Secondary | ICD-10-CM | POA: Diagnosis not present

## 2021-05-25 DIAGNOSIS — F411 Generalized anxiety disorder: Secondary | ICD-10-CM | POA: Diagnosis not present

## 2021-06-06 DIAGNOSIS — F330 Major depressive disorder, recurrent, mild: Secondary | ICD-10-CM | POA: Diagnosis not present

## 2021-06-06 DIAGNOSIS — F411 Generalized anxiety disorder: Secondary | ICD-10-CM | POA: Diagnosis not present

## 2021-06-13 DIAGNOSIS — E7800 Pure hypercholesterolemia, unspecified: Secondary | ICD-10-CM | POA: Diagnosis not present

## 2021-06-13 DIAGNOSIS — E1169 Type 2 diabetes mellitus with other specified complication: Secondary | ICD-10-CM | POA: Diagnosis not present

## 2021-06-13 DIAGNOSIS — F17200 Nicotine dependence, unspecified, uncomplicated: Secondary | ICD-10-CM | POA: Diagnosis not present

## 2021-06-13 DIAGNOSIS — F5101 Primary insomnia: Secondary | ICD-10-CM | POA: Diagnosis not present

## 2021-06-17 ENCOUNTER — Ambulatory Visit (INDEPENDENT_AMBULATORY_CARE_PROVIDER_SITE_OTHER): Admitting: Physician Assistant

## 2021-06-17 ENCOUNTER — Telehealth: Payer: Self-pay | Admitting: Physical Medicine and Rehabilitation

## 2021-06-17 DIAGNOSIS — E7800 Pure hypercholesterolemia, unspecified: Secondary | ICD-10-CM | POA: Diagnosis not present

## 2021-06-17 DIAGNOSIS — F17200 Nicotine dependence, unspecified, uncomplicated: Secondary | ICD-10-CM | POA: Diagnosis not present

## 2021-06-17 DIAGNOSIS — M7542 Impingement syndrome of left shoulder: Secondary | ICD-10-CM

## 2021-06-17 DIAGNOSIS — M7541 Impingement syndrome of right shoulder: Secondary | ICD-10-CM | POA: Diagnosis not present

## 2021-06-17 NOTE — Progress Notes (Signed)
Office Visit Note   Patient: Jacqueline Sparks           Date of Birth: [birth year]           MRN: [id number] Visit Date: 06/17/2021              Requested by: Mar Daring, PA-C [place] [address] [address] [place],  [place] [address] PCP: Mar Daring, PA-C  Chief Complaint  Patient presents with   Right Shoulder - Pain      HPI: Patient presents today for follow-up for her bilateral shoulders.  Her right shoulder is related to her work-related injury.  Her job at the [employer] has put her on disability.  She comes in for regular visit.  She says she is about the same.  She understands that the next surgery for her right shoulder would be a shoulder replacement but she has concerned about this considering she is a diabetic.  She also said that Social Security had not received the documentation from our office.  She also comes in complaining of recurrent painful trigger finger.  She does not want to do anything about this right now as far as an injection.  Assessment & Plan: Visit Diagnoses: No diagnosis found.  Plan: Patient will follow-up in a month.  We will follow-up with what paperwork needs to be resent.  Follow-Up Instructions: No follow-ups on file.   Ortho Exam  Patient is alert, oriented, no adenopathy, well-dressed, normal affect, normal respiratory effort. Right shoulder she has limited forward elevation limited internal rotation behind her back she has pain with manipulation.  She has abduction and flexion to 90 degrees.  She has a positive Neer's and Hawkins impingement test she cannot do internal rotation behind her back really at all  Imaging: No results found. No images are attached to the encounter.  Labs: Lab Results  Component Value Date   HGBA1C 7.5 (A) 04/09/2021   HGBA1C 8.4 (A) 12/28/2020   HGBA1C 8.4 (A) 09/28/2020   LABURIC 5.3 04/10/2017     Lab Results  Component Value Date   ALBUMIN 4.4 03/26/2020   ALBUMIN 4.2 03/03/2019    ALBUMIN 4.1 11/24/2016    No results found for: MG Lab Results  Component Value Date   VD25OH 42.8 03/26/2020    No results found for: PREALBUMIN CBC EXTENDED Latest Ref Rng & Units 03/26/2020 03/03/2019 11/24/2016  WBC 3.4 - 10.8 x10E3/uL 7.5 7.5 8.6  RBC 3.77 - 5.28 x10E6/uL 5.06 4.62 4.95  HGB 11.1 - 15.9 g/dL 13.5 12.5 13.8  HCT 34.0 - 46.6 % 41.8 37.4 41.4  PLT 150 - 450 x10E3/uL 308 305 375  NEUTROABS 1.4 - 7.0 x10E3/uL 3.2 3.4 3.9  LYMPHSABS 0.7 - 3.1 x10E3/uL 3.7(H) 3.2(H) 4.1(H)     There is no height or weight on file to calculate BMI.  Orders:  No orders of the defined types were placed in this encounter.  No orders of the defined types were placed in this encounter.    Procedures: No procedures performed  Clinical Data: No additional findings.  ROS:  All other systems negative, except as noted in the HPI. Review of Systems  Objective: Vital Signs: There were no vitals taken for this visit.  Specialty Comments:  No specialty comments available.  PMFS History: Patient Active Problem List   Diagnosis Date Noted   Psoriasis 06/01/2017   Polyp of sigmoid colon    Benign neoplasm of descending colon    Impingement syndrome  of left shoulder 10/06/2016   Impingement syndrome of right shoulder 10/06/2016   BP (high blood pressure) 11/12/2015   Abnormal abdominal MRI 11/09/2015   Abnormal ECG 11/09/2015   Anxiety 11/09/2015   Obesity 01/30/2014   Smoking 01/30/2014   Avitaminosis D 05/03/2010   Obstructive apnea 06/09/2007   Hypercholesteremia 06/09/2007   Compulsive tobacco user syndrome 06/09/2007   Renal tubular disorder 05/19/2007   Diabetes mellitus, type 2 ([place]) 06/04/2006   Past Medical History:  Diagnosis Date   Anxiety    Bronchitis    Diabetes mellitus without complication (HCC)    GERD (gastroesophageal reflux disease)    Hyperlipidemia    Sleep apnea     Family History  Problem Relation Age of Onset   Hyperlipidemia Mother     Hypertension Mother    Diabetes Mother    Diabetes Father    Hyperlipidemia Father    Hypertension Father     Past Surgical History:  Procedure Laterality Date   CARPAL TUNNEL RELEASE  2001   as staated this was the left side, right side was completed in 2003   COLONOSCOPY WITH PROPOFOL N/A 02/17/2017   Procedure: COLONOSCOPY WITH PROPOFOL;  Surgeon: Lucilla Lame, MD;  Location: [facility] ENDOSCOPY;  Service: Endoscopy;  Laterality: N/A;   DILATION AND CURETTAGE OF UTERUS  2008   as stated uterine polyps   ROTATOR CUFF REPAIR Right 3004 and 2010   TUBAL LIGATION  1999   Social History   Occupational History   Not on file  Tobacco Use   Smoking status: Every Day    Packs/day: 1.00    Years: 30.00    Pack years: 30.00    Types: Cigarettes   Smokeless tobacco: Never  Vaping Use   Vaping Use: Never used  Substance and Sexual Activity   Alcohol use: Yes    Comment: seldom use   Drug use: No   Sexual activity: Never

## 2021-06-17 NOTE — Telephone Encounter (Signed)
Pt wants to get scheduled with Dr.Newton.    CB [phone]

## 2021-06-18 DIAGNOSIS — L400 Psoriasis vulgaris: Secondary | ICD-10-CM | POA: Diagnosis not present

## 2021-06-18 DIAGNOSIS — M2550 Pain in unspecified joint: Secondary | ICD-10-CM | POA: Diagnosis not present

## 2021-06-18 NOTE — Telephone Encounter (Signed)
Left message #1

## 2021-06-28 DIAGNOSIS — R079 Chest pain, unspecified: Secondary | ICD-10-CM | POA: Diagnosis not present

## 2021-06-28 DIAGNOSIS — I10 Essential (primary) hypertension: Secondary | ICD-10-CM | POA: Diagnosis not present

## 2021-06-28 DIAGNOSIS — R002 Palpitations: Secondary | ICD-10-CM | POA: Diagnosis not present

## 2021-06-28 DIAGNOSIS — R0609 Other forms of dyspnea: Secondary | ICD-10-CM | POA: Diagnosis not present

## 2021-06-30 ENCOUNTER — Other Ambulatory Visit: Payer: Self-pay | Admitting: Physician Assistant

## 2021-06-30 DIAGNOSIS — E119 Type 2 diabetes mellitus without complications: Secondary | ICD-10-CM

## 2021-07-15 ENCOUNTER — Ambulatory Visit (INDEPENDENT_AMBULATORY_CARE_PROVIDER_SITE_OTHER): Payer: Federal, State, Local not specified - PPO | Admitting: Orthopedic Surgery

## 2021-07-15 ENCOUNTER — Other Ambulatory Visit: Payer: Self-pay

## 2021-07-15 DIAGNOSIS — M65331 Trigger finger, right middle finger: Secondary | ICD-10-CM

## 2021-07-22 DIAGNOSIS — R079 Chest pain, unspecified: Secondary | ICD-10-CM | POA: Diagnosis not present

## 2021-07-22 DIAGNOSIS — R0602 Shortness of breath: Secondary | ICD-10-CM | POA: Diagnosis not present

## 2021-07-23 DIAGNOSIS — L400 Psoriasis vulgaris: Secondary | ICD-10-CM | POA: Diagnosis not present

## 2021-07-23 DIAGNOSIS — Z7960 Long term (current) use of unspecified immunomodulators and immunosuppressants: Secondary | ICD-10-CM | POA: Diagnosis not present

## 2021-07-23 DIAGNOSIS — Z23 Encounter for immunization: Secondary | ICD-10-CM | POA: Diagnosis not present

## 2021-07-23 DIAGNOSIS — Z111 Encounter for screening for respiratory tuberculosis: Secondary | ICD-10-CM | POA: Diagnosis not present

## 2021-07-23 DIAGNOSIS — L4050 Arthropathic psoriasis, unspecified: Secondary | ICD-10-CM | POA: Diagnosis not present

## 2021-07-23 DIAGNOSIS — M779 Enthesopathy, unspecified: Secondary | ICD-10-CM | POA: Diagnosis not present

## 2021-07-29 DIAGNOSIS — I10 Essential (primary) hypertension: Secondary | ICD-10-CM | POA: Diagnosis not present

## 2021-07-29 DIAGNOSIS — R002 Palpitations: Secondary | ICD-10-CM | POA: Diagnosis not present

## 2021-07-29 DIAGNOSIS — R0609 Other forms of dyspnea: Secondary | ICD-10-CM | POA: Diagnosis not present

## 2021-07-29 DIAGNOSIS — R079 Chest pain, unspecified: Secondary | ICD-10-CM | POA: Diagnosis not present

## 2021-07-30 DIAGNOSIS — L400 Psoriasis vulgaris: Secondary | ICD-10-CM | POA: Diagnosis not present

## 2021-07-30 DIAGNOSIS — M2550 Pain in unspecified joint: Secondary | ICD-10-CM | POA: Diagnosis not present

## 2021-08-02 DIAGNOSIS — F330 Major depressive disorder, recurrent, mild: Secondary | ICD-10-CM | POA: Diagnosis not present

## 2021-08-02 DIAGNOSIS — F411 Generalized anxiety disorder: Secondary | ICD-10-CM | POA: Diagnosis not present

## 2021-08-08 DIAGNOSIS — F411 Generalized anxiety disorder: Secondary | ICD-10-CM | POA: Diagnosis not present

## 2021-08-08 DIAGNOSIS — F330 Major depressive disorder, recurrent, mild: Secondary | ICD-10-CM | POA: Diagnosis not present

## 2021-08-13 ENCOUNTER — Encounter: Payer: Self-pay | Admitting: Orthopedic Surgery

## 2021-08-13 ENCOUNTER — Ambulatory Visit: Payer: Self-pay | Admitting: Family Medicine

## 2021-08-13 NOTE — Progress Notes (Signed)
Office Visit Note   Patient: Jacqueline Sparks           Date of Birth: 11-22-1960           MRN: [id number] Visit Date: 07/15/2021              Requested by: Mar Daring, PA-C [place] [place],  [place] [address] PCP: No primary care provider on file.  Chief Complaint  Patient presents with   Right Shoulder - Pain      HPI: Patient is a 60 year old woman who presents in follow-up for painful triggering of the right long finger.  Patient states she has failed conservative therapy.  Assessment & Plan: Visit Diagnoses:  1. Trigger finger, right middle finger     Plan: We will plan for release of the A1 pulley right long finger.  Risk and benefits were discussed patient states she understands wished to proceed.  Follow-Up Instructions: Return in about 2 weeks (around 07/29/2021).   Ortho Exam  Patient is alert, oriented, no adenopathy, well-dressed, normal affect, normal respiratory effort. Examination patient has palpable triggering at the A1 pulley right long finger her hand is neurovascular intact.  Imaging: No results found. No images are attached to the encounter.  Labs: Lab Results  Component Value Date   HGBA1C 7.5 (A) 04/09/2021   HGBA1C 8.4 (A) 12/28/2020   HGBA1C 8.4 (A) 09/28/2020   LABURIC 5.3 04/10/2017     Lab Results  Component Value Date   ALBUMIN 4.4 03/26/2020   ALBUMIN 4.2 03/03/2019   ALBUMIN 4.1 11/24/2016    No results found for: MG Lab Results  Component Value Date   VD25OH 42.8 03/26/2020    No results found for: PREALBUMIN CBC EXTENDED Latest Ref Rng & Units 03/26/2020 03/03/2019 11/24/2016  WBC 3.4 - 10.8 x10E3/uL 7.5 7.5 8.6  RBC 3.77 - 5.28 x10E6/uL 5.06 4.62 4.95  HGB 11.1 - 15.9 g/dL 13.5 12.5 13.8  HCT 34.0 - 46.6 % 41.8 37.4 41.4  PLT 150 - 450 x10E3/uL 308 305 375  NEUTROABS 1.4 - 7.0 x10E3/uL 3.2 3.4 3.9  LYMPHSABS 0.7 - 3.1 x10E3/uL 3.7(H) 3.2(H) 4.1(H)     There is no height or weight on file to  calculate BMI.  Orders:  No orders of the defined types were placed in this encounter.  No orders of the defined types were placed in this encounter.    Procedures: No procedures performed  Clinical Data: No additional findings.  ROS:  All other systems negative, except as noted in the HPI. Review of Systems  Objective: Vital Signs: There were no vitals taken for this visit.  Specialty Comments:  No specialty comments available.  PMFS History: Patient Active Problem List   Diagnosis Date Noted   Psoriasis 06/01/2017   Polyp of sigmoid colon    Benign neoplasm of descending colon    Impingement syndrome of left shoulder 10/06/2016   Impingement syndrome of right shoulder 10/06/2016   BP (high blood pressure) 11/12/2015   Abnormal abdominal MRI 11/09/2015   Abnormal ECG 11/09/2015   Anxiety 11/09/2015   Obesity 01/30/2014   Smoking 01/30/2014   Avitaminosis D 05/03/2010   Obstructive apnea 06/09/2007   Hypercholesteremia 06/09/2007   Compulsive tobacco user syndrome 06/09/2007   Renal tubular disorder 05/19/2007   Diabetes mellitus, type 2 ([place]) 06/04/2006   Past Medical History:  Diagnosis Date   Anxiety    Bronchitis    Diabetes mellitus without complication ([place])  GERD (gastroesophageal reflux disease)    Hyperlipidemia    Sleep apnea     Family History  Problem Relation Age of Onset   Hyperlipidemia Mother    Hypertension Mother    Diabetes Mother    Diabetes Father    Hyperlipidemia Father    Hypertension Father     Past Surgical History:  Procedure Laterality Date   CARPAL TUNNEL RELEASE  2001   as staated this was the left side, right side was completed in 2003   COLONOSCOPY WITH PROPOFOL N/A 02/17/2017   Procedure: COLONOSCOPY WITH PROPOFOL;  Surgeon: Lucilla Lame, MD;  Location: [facility] END[facility]COPY;  Service: Endoscopy;  Laterality: N/A;   DILATION AND CURETTAGE OF UTERUS  2008   as stated uterine polyps   ROTATOR CUFF REPAIR Right 3004 and  2010   TUBAL LIGATION  1999   Social History   Occupational History   Not on file  Tobacco Use   Smoking status: Every Day    Packs/day: 1.00    Years: 30.00    Pack years: 30.00    Types: Cigarettes   Smokeless tobacco: Never  Vaping Use   Vaping Use: Never used  Substance and Sexual Activity   Alcohol use: Yes    Comment: seldom use   Drug use: No   Sexual activity: Never

## 2021-08-13 NOTE — Progress Notes (Deleted)
Established patient visit   Patient: Jacqueline Sparks   DOB: March 29, 1961   60 y.o. Female  MRN: [id number] Visit Date: 08/13/2021  Today's healthcare provider: Lelon Huh, MD   No chief complaint on file.  Subjective    HPI  Diabetes Mellitus Type II, Follow-up  Lab Results  Component Value Date   HGBA1C 7.5 (A) 04/09/2021   HGBA1C 8.4 (A) 12/28/2020   HGBA1C 8.4 (A) 09/28/2020   Wt Readings from Last 3 Encounters:  04/09/21 159 lb (72.1 kg)  03/21/21 160 lb (72.6 kg)  12/28/20 160 lb (72.6 kg)   Last seen for diabetes 4 months ago.  Management since then includes continuing same medications for now. Anticipated reducing glipizide and possibly increasing Trulicity at follow up.  She reports {excellent/good/fair/poor:19665} compliance with treatment. She {is/is not:21021397} having side effects. {document side effects if present:1} Symptoms: {Yes/No:20286} fatigue {Yes/No:20286} foot ulcerations  {Yes/No:20286} appetite changes {Yes/No:20286} nausea  {Yes/No:20286} paresthesia of the feet  {Yes/No:20286} polydipsia  {Yes/No:20286} polyuria {Yes/No:20286} visual disturbances   {Yes/No:20286} vomiting     Home blood sugar records: {diabetes glucometry results:16657}  Episodes of hypoglycemia? {Yes/No:20286} {enter symptoms and frequency of symptoms if yes:1}   Current insulin regiment: none Most Recent Eye Exam: not UTD {Current exercise:16438:::1} {Current diet habits:16563:::1}  Pertinent Labs: Lab Results  Component Value Date   CHOL 155 03/26/2020   HDL 30 (L) 03/26/2020   LDLCALC 91 03/26/2020   TRIG 198 (H) 03/26/2020   CHOLHDL 5.3 (H) 03/03/2019   Lab Results  Component Value Date   NA 143 03/26/2020   K 4.3 03/26/2020   CREATININE 0.76 03/26/2020   GFRNONAA 87 03/26/2020   GLUCOSE 101 (H) 03/26/2020     ---------------------------------------------------------------------------------------------------   Lipid/Cholesterol, Follow-up  Last  lipid panel Other pertinent labs  Lab Results  Component Value Date   CHOL 155 03/26/2020   HDL 30 (L) 03/26/2020   LDLCALC 91 03/26/2020   TRIG 198 (H) 03/26/2020   CHOLHDL 5.3 (H) 03/03/2019   Lab Results  Component Value Date   ALT 15 03/26/2020   AST 21 03/26/2020   PLT 308 03/26/2020   TSH 0.971 03/26/2020     She was last seen for this 4 months ago.  Management since that visit includes continuing same medications.  She reports {excellent/good/fair/poor:19665} compliance with treatment. She {is/is not:9024} having side effects. {document side effects if present:1}  Symptoms: {Yes/No:20286} chest pain {Yes/No:20286} chest pressure/discomfort  {Yes/No:20286} dyspnea {Yes/No:20286} lower extremity edema  {Yes/No:20286} numbness or tingling of extremity {Yes/No:20286} orthopnea  {Yes/No:20286} palpitations {Yes/No:20286} paroxysmal nocturnal dyspnea  {Yes/No:20286} speech difficulty {Yes/No:20286} syncope   Current diet: {diet habits:16563} Current exercise: {exercise types:16438}  The 10-year ASCVD risk score (Arnett DK, et al., 2019) is: 28.3%  ---------------------------------------------------------------------------------------------------   {Link to patient history deactivated due to formatting error:1}  Medications: Outpatient Medications Prior to Visit  Medication Sig   ALPRAZolam (XANAX) 0.5 MG tablet Take 1 tablet (0.5 mg total) by mouth 2 (two) times daily as needed for anxiety.   atorvastatin (LIPITOR) 20 MG tablet Take 1 tablet (20 mg total) by mouth daily.   clobetasol ointment (TEMOVATE) 9.51 % Apply 1 application topically 2 (two) times daily.   Dulaglutide (TRULICITY) 1.5 OA/4.1YS SOPN Inject 1.5 mg into the skin once a week.   glipiZIDE (GLUCOTROL) 10 MG tablet Take 1 tablet (10 mg total) by mouth 2 (two) times daily before a meal. (Patient taking differently: Take 10 mg by mouth daily before breakfast.)  JANUVIA 100 MG tablet Take 1 tablet by mouth  once daily (Patient not taking: Reported on 04/09/2021)   mometasone (ELOCON) 0.1 % cream Apply 1 application topically daily.   NON FORMULARY CPAP   ONE TOUCH ULTRA TEST test strip USE ONE STRIP TO CHECK GLUCOSE ONCE DAILY   pioglitazone (ACTOS) 45 MG tablet Take 1 tablet by mouth once daily   triamcinolone cream (KENALOG) 0.5 % Apply 1 application topically 3 (three) times daily.   umeclidinium-vilanterol (ANORO ELLIPTA) 62.5-25 MCG/INH AEPB Inhale 1 puff into the lungs daily.   No facility-administered medications prior to visit.    Review of Systems  {Labs  Heme  Chem  Endocrine  Serology  Results Review (optional):23779}   Objective    There were no vitals taken for this visit. {Show previous vital signs (optional):23777}  Physical Exam  ***  No results found for any visits on 08/13/21.  Assessment & Plan     ***  No follow-ups on file.      {provider attestation***:1}   Lelon Huh, MD  [facility] [phone] (phone) [phone] (fax)  [place]

## 2021-10-07 ENCOUNTER — Ambulatory Visit (INDEPENDENT_AMBULATORY_CARE_PROVIDER_SITE_OTHER): Payer: Federal, State, Local not specified - PPO | Admitting: Orthopedic Surgery

## 2021-10-07 ENCOUNTER — Encounter: Payer: Self-pay | Admitting: Orthopedic Surgery

## 2021-10-07 DIAGNOSIS — M7542 Impingement syndrome of left shoulder: Secondary | ICD-10-CM

## 2021-10-07 DIAGNOSIS — M7541 Impingement syndrome of right shoulder: Secondary | ICD-10-CM | POA: Diagnosis not present

## 2021-10-07 NOTE — Progress Notes (Signed)
Office Visit Note   Patient: Jacqueline Sparks           Date of Birth: October 22, 1960           MRN: [id number] Visit Date: 10/07/2021              Requested by: No referring provider defined for this encounter. PCP: No primary care provider on file.  Chief Complaint  Patient presents with   Right Shoulder - Pain   Left Shoulder - Pain      HPI: Patient is a 60 year old woman who presents complaining of persistent bilateral shoulder pain that is most recently worse on the left than the right.  Patient states she needs a right shoulder replacement but she states that her left is hurting more at this time.  Patient states she has recently been diagnosed with psoriatic arthritis and is currently had 3 injections and will plan for injections every other week.  Assessment & Plan: Visit Diagnoses:  1. Impingement syndrome of left shoulder   2. Impingement syndrome of right shoulder     Plan: Patient is given a prescription for physical therapy in [place] for scapular stabilization and strengthening of both shoulders.  Patient has recently started medication for psoriatic arthritis and I feel with the therapy and arthritis medication she should see an improvement.  The MRI scans do not show articular cartilage damage and I do not feel that a shoulder replacement would help the left shoulder.  Follow-Up Instructions: Return in about 4 weeks (around 11/04/2021).   Ortho Exam  Patient is alert, oriented, no adenopathy, well-dressed, normal affect, normal respiratory effort. Examination she has abduction and flexion of both shoulders to 90 degrees.  The left shoulder has internal and external rotation of 90 degrees the right shoulder has internal and external rotation of 70 degrees.  There is no crepitation with range of motion.  Her MRI scan was reviewed for the left shoulder which showed inflammation in the rotator cuff but did not show any articular cartilage defects.  Imaging: No results  found. No images are attached to the encounter.  Labs: Lab Results  Component Value Date   HGBA1C 7.5 (A) 04/09/2021   HGBA1C 8.4 (A) 12/28/2020   HGBA1C 8.4 (A) 09/28/2020   LABURIC 5.3 04/10/2017     Lab Results  Component Value Date   ALBUMIN 4.4 03/26/2020   ALBUMIN 4.2 03/03/2019   ALBUMIN 4.1 11/24/2016    No results found for: MG Lab Results  Component Value Date   VD25OH 42.8 03/26/2020    No results found for: PREALBUMIN CBC EXTENDED Latest Ref Rng & Units 03/26/2020 03/03/2019 11/24/2016  WBC 3.4 - 10.8 x10E3/uL 7.5 7.5 8.6  RBC 3.77 - 5.28 x10E6/uL 5.06 4.62 4.95  HGB 11.1 - 15.9 g/dL 13.5 12.5 13.8  HCT 34.0 - 46.6 % 41.8 37.4 41.4  PLT 150 - 450 x10E3/uL 308 305 375  NEUTROABS 1.4 - 7.0 x10E3/uL 3.2 3.4 3.9  LYMPHSABS 0.7 - 3.1 x10E3/uL 3.7(H) 3.2(H) 4.1(H)     There is no height or weight on file to calculate BMI.  Orders:  No orders of the defined types were placed in this encounter.  No orders of the defined types were placed in this encounter.    Procedures: No procedures performed  Clinical Data: No additional findings.  ROS:  All other systems negative, except as noted in the HPI. Review of Systems  Objective: Vital Signs: There were no vitals taken for this visit.  Specialty Comments:  No specialty comments available.  PMFS History: Patient Active Problem List   Diagnosis Date Noted   Psoriasis 06/01/2017   Polyp of sigmoid colon    Benign neoplasm of descending colon    Impingement syndrome of left shoulder 10/06/2016   Impingement syndrome of right shoulder 10/06/2016   BP (high blood pressure) 11/12/2015   Abnormal abdominal MRI 11/09/2015   Abnormal ECG 11/09/2015   Anxiety 11/09/2015   Obesity 01/30/2014   Smoking 01/30/2014   Avitaminosis D 05/03/2010   Obstructive apnea 06/09/2007   Hypercholesteremia 06/09/2007   Compulsive tobacco user syndrome 06/09/2007   Renal tubular disorder 05/19/2007   Diabetes mellitus,  type 2 ([place]) 06/04/2006   Past Medical History:  Diagnosis Date   Anxiety    Bronchitis    Diabetes mellitus without complication (HCC)    GERD (gastroesophageal reflux disease)    Hyperlipidemia    Sleep apnea     Family History  Problem Relation Age of Onset   Hyperlipidemia Mother    Hypertension Mother    Diabetes Mother    Diabetes Father    Hyperlipidemia Father    Hypertension Father     Past Surgical History:  Procedure Laterality Date   CARPAL TUNNEL RELEASE  2001   as staated this was the left side, right side was completed in 2003   COLONOSCOPY WITH PROPOFOL N/A 02/17/2017   Procedure: COLONOSCOPY WITH PROPOFOL;  Surgeon: Lucilla Lame, MD;  Location: [facility] ENDOSCOPY;  Service: Endoscopy;  Laterality: N/A;   DILATION AND CURETTAGE OF UTERUS  2008   as stated uterine polyps   ROTATOR CUFF REPAIR Right 3004 and 2010   TUBAL LIGATION  1999   Social History   Occupational History   Not on file  Tobacco Use   Smoking status: Every Day    Packs/day: 1.00    Years: 30.00    Pack years: 30.00    Types: Cigarettes   Smokeless tobacco: Never  Vaping Use   Vaping Use: Never used  Substance and Sexual Activity   Alcohol use: Yes    Comment: seldom use   Drug use: No   Sexual activity: Never

## 2021-10-09 ENCOUNTER — Telehealth: Payer: Self-pay | Admitting: Physical Therapy

## 2021-10-09 ENCOUNTER — Ambulatory Visit: Payer: Federal, State, Local not specified - PPO | Admitting: Physical Therapy

## 2021-10-09 NOTE — Telephone Encounter (Signed)
Called pt to inquiry about her status for attending PT today and future apts. Did not reach pt so left VM instructing pt to call back.

## 2021-10-10 ENCOUNTER — Ambulatory Visit: Payer: Federal, State, Local not specified - PPO | Attending: Orthopedic Surgery | Admitting: Physical Therapy

## 2021-10-10 ENCOUNTER — Other Ambulatory Visit: Payer: Self-pay

## 2021-10-10 ENCOUNTER — Ambulatory Visit: Payer: Federal, State, Local not specified - PPO | Admitting: Physical Therapy

## 2021-10-10 DIAGNOSIS — G8929 Other chronic pain: Secondary | ICD-10-CM | POA: Diagnosis present

## 2021-10-10 DIAGNOSIS — M25512 Pain in left shoulder: Secondary | ICD-10-CM | POA: Diagnosis not present

## 2021-10-10 NOTE — Therapy (Signed)
[facility] [address], [place], [address] Phone: [phone]   Fax:  [phone]  Physical Therapy Treatment  Patient Details  Name: Jacqueline Sparks MRN: [id number] Date of Birth: 04/02/1961 Referring Provider (PT): Dr. Sharol Given   Encounter Date: 10/10/2021   PT End of Session - 10/10/21 1643     Visit Number 1    Number of Visits 12    Date for PT Re-Evaluation 12/05/21    Authorization Type [place] - Visit Number 1    Authorization - Number of Visits 50    Progress Note Due on Visit 10    PT Start Time 1330    PT Stop Time 1415    PT Time Calculation (min) 45 min    Activity Tolerance Patient limited by pain    Behavior During Therapy [facility] for tasks assessed/performed             Past Medical History:  Diagnosis Date   Anxiety    Bronchitis    Diabetes mellitus without complication ([employer])    GERD (gastroesophageal reflux disease)    Hyperlipidemia    Sleep apnea     Past Surgical History:  Procedure Laterality Date   CARPAL TUNNEL RELEASE  2001   as staated this was the left side, right side was completed in 2003   COLONOSCOPY WITH PROPOFOL N/A 02/17/2017   Procedure: COLONOSCOPY WITH PROPOFOL;  Surgeon: Lucilla Lame, MD;  Location: [facility] ENDOSCOPY;  Service: Endoscopy;  Laterality: N/A;   DILATION AND CURETTAGE OF UTERUS  2008   as stated uterine polyps   ROTATOR CUFF REPAIR Right 3004 and 2010   TUBAL LIGATION  1999    There were no vitals filed for this visit.   Subjective Assessment - 10/10/21 1343     Subjective Pt reports that she does not want to have another surgery on her left shoulder after experiencing a complicated right shoulder surgery. She has had injections in the past but those do not help any more.    Pertinent History Patient is a 60 year old woman who presents complaining of persistent bilateral shoulder pain that is most recently worse on the left  than the right.  Patient states she needs a right shoulder replacement but she states that her left is hurting more at this time.     Patient states she has recently been diagnosed with psoriatic arthritis and is currently had 3 injections and will plan for injections every other week.    How long can you sit comfortably? N/a    How long can you stand comfortably? N/a    How long can you walk comfortably? N/a    Diagnostic tests CLINICAL DATA:  Left shoulder pain extending down the arm. Weakness.     EXAM:  MRI OF THE LEFT SHOULDER WITHOUT CONTRAST     TECHNIQUE:  Multiplanar, multisequence MR imaging of the shoulder was performed.  No intravenous contrast was administered.     COMPARISON:  Radiographs 09/18/2016     FINDINGS:  Rotator cuff: Prominent supraspinatus and subscapularis tendinopathy  with moderate infraspinatus tendinopathy. This tendinopathy involves  expansion of the supraspinatus and subscapularis tendons does [place]  along with accentuated signal. Suspected mild partial thickness  articular surface tearing of the subscapularis.     Muscles: Linear vertical accentuated edema signal in the lateral  deltoid, compatible with muscle strain or recent injection.     Biceps long head: Prominent  expansion and increased signal of the  intra-articular segment compatible with prominent tendinopathy and  possibly mild partial tearing. Mild medial displacement of the  intra-articular segment.     Acromioclavicular Joint: Moderate spurring and mild subcortical  marrow edema. Type II acromion. Mild subacromial subdeltoid bursitis  and mild subcoracoid bursitis.     Glenohumeral Joint: There is synovitis in the rotator interval.  Preserved articular cartilage.     Labrum:  Unremarkable     Bones: No significant extra-articular osseous abnormalities  identified.     Other: No supplemental non-categorized findings.     IMPRESSION:  1. Prominent supraspinatus and subscapularis tendinopathy with  moderate  infraspinatus tendinopathy. Suspected mild partial  thickness articular surface tearing of the subscapularis.  2. Prominent tendinopathy and possibly mild partial tearing of the  intra-articular segment of the long head of the biceps.  3. Mild subacromial subdeltoid bursitis and subcoracoid bursitis.  4. Synovitis in the rotator interval. This can be associated with  adhesive capsulitis.  5. Moderate degenerative AC joint arthropathy.        Electronically Signed    By: Van Clines M.D.    On: 09/16/2020 17:05    Patient Stated Goals Avoid surgery. Wants to be able to regain left shoulder to be able to drive, do her hair, and clean her bathroom.    Currently in Pain? Yes    Pain Score 6     Pain Location Shoulder    Pain Orientation Left    Pain Descriptors / Indicators Aching    Pain Type Chronic pain    Pain Onset More than a month ago    Pain Frequency Intermittent    Aggravating Factors  Hurts when moving it    Pain Relieving Factors Tylenol. Heating Pads and Ice    Effect of Pain on Daily Activities Not able to do much                [facility] PT Assessment - 10/10/21 0001       Assessment   Medical Diagnosis Left Shoulder Pain    Referring Provider (PT) Dr. Sharol Given    Onset Date/Surgical Date 10/21/15    Hand Dominance Right    Next MD Visit TBD    Prior Therapy Yes      Balance Screen   Has the patient fallen in the past 6 months Yes    How many times? 3    Has the patient had a decrease in activity level because of a fear of falling?  Yes    Is the patient reluctant to leave their home because of a fear of falling?  No      Home Environment   Living Environment Private residence    Living Arrangements Spouse/significant other;Children    Type of [place] Access Level entry    [place] One level      Prior Function   Level of [place] On disability      Cognition   Overall Cognitive Status Within Functional Limits for  tasks assessed            Shoulder Eval    Elbow Screen Elbow AROM: Within Normal Limits  Palpation No pain with palpation to anterior, posterior, lateral, and superior shoulder  Strength R/L 4/4 Shoulder flexion (anterior deltoid/pec major/coracobrachialis, axillary n. (C5-6) and musculocutaneous n. (C5-7)) 4/4 Shoulder abduction (deltoid/supraspinatus, axillary/suprascapular n, C5) 3+/3+ Shoulder external rotation (infraspinatus/teres minor) 4/4  Shoulder internal rotation (subcapularis/lats/pec major) 5/5 Shoulder extension (posterior deltoid, lats, teres major, axillary/thoracodorsal n.) 5/5 Shoulder horizontal abduction 4/4 Elbow flexion (biceps brachii, brachialis, brachioradialis, musculoskeletal n, C5-6) 5/5 Elbow extension (triceps, radial n, C7)   AROM R/L 90*/90* Shoulder flexion 90*/90* Shoulder abduction NT Shoulder external rotation NT Shoulder internal rotation 60/60 Shoulder extension *Indicates pain, overpressure performed unless otherwise indicated  PROM R/L 120*/120* Shoulder flexion 120*/120* Shoulder abduction NT Shoulder external rotation NT Shoulder internal rotation 60/60 Shoulder extension *Indicates pain, overpressure performed unless otherwise indicated  Accessory Motions/Glides: Deferred due to pain     Sensation Grossly intact to light touch bilateral UE as determined by testing dermatomes C2-T2 Proprioception and hot/cold testing deferred on this date    SPECIAL TESTS: Deferred due to pain      THEREX:  Shoulder Flex Ext AAROM Pulleys 3 x 10  Shoulder Abd Add AAROM Pulleys 3 x 10   HEP includes the following:   Access Code: [email or web address] URL: [email or web address] Date: 10/10/2021 Prepared by: Bradly Chris  Exercises Seated Shoulder Flexion AAROM with Pulley Behind - 1 x daily - 7 x weekly - 3 sets - 10 reps Seated Shoulder Abduction AAROM with Pulley Behind - 1 x daily - 7 x weekly - 3 sets - 10  reps             PT Short Term Goals - 10/10/21 1653       PT SHORT TERM GOAL #1   Title Patient will demonstrate understanding of HEP to improve outcomes.    Baseline 12/22: NT    Time 2    Period Weeks    Status New    Target Date 10/24/21               PT Long Term Goals - 10/10/21 1656       PT LONG TERM GOAL #1   Title Patient will have improved function and activity level as evidenced by an increase in FOTO score by 10 points or more.    Baseline 12/22: 36/54    Time 8    Period Weeks    Status New    Target Date 12/05/21      PT LONG TERM GOAL #2   Title Patient will be able to perform combined ER to reach occiput and to increased shoulder IR at 0 deg to perform an increased amount of UE activities such as self-grooming and to operate steering more safely.    Baseline 12/22: Shoulder flexion AROM R/L 90 90 Shoulder flexion PROM R/L 120 120  Shoulder Abduction AROM R/L 90 90 Shoulder Abduction PROM R/L 120 120    Time 8    Period Weeks    Status New    Target Date 12/05/21      PT LONG TERM GOAL #3   Title Pt will increase strength of  by at least 1/2 MMT grade in order to demonstrate improvement in strength and function to drive more safely and complete self-care tasks.    Baseline 12/22: Shoulder flex R/L 4/4 Shoulder Abd 4/4 Shoulder ER at O deg Ab R/L 3+/3+ Shoulder IR at 0 deg abd R/L 4/4    Time 8    Period Weeks    Status New    Target Date 12/05/21                   Plan - 10/10/21 1645     Clinical Impression Statement Pt is a 60 yo that presents  for bilateral shoulder pain with prior imaging confirming significant RTC pathology and shoulder arthritis. She demonstrates decreased bilateral shoulder ROM and strength that his limiting her ability to perform self care tasks such as combing her hair and operating driver's wheel safely. Given extent and prolonged onset of shoulder pathology, pt's rehab potential is poor to fair at best.     Personal Factors and Comorbidities Comorbidity 3+    Comorbidities T2DM, Psoriatic Arthritis, Tobacco Use    Examination-Activity Limitations Carry;Bathing;Hygiene/Grooming;Reach Overhead;Lift    Examination-Participation Restrictions Driving;Occupation;Cleaning;Laundry    Stability/Clinical Decision Making Stable/Uncomplicated    Clinical Decision Making Low    Rehab Potential Fair    Clinical Impairments Affecting Rehab Potential Smoker, Diabetes, Prolonged History of Onset, Extent of RTC Pathology in Right Shoulder    PT Frequency 2x / week    PT Duration 8 weeks    PT Treatment/Interventions Therapeutic exercise;Therapeutic activities;Cryotherapy;Electrical Stimulation;Moist Heat;Manual techniques;Patient/family education;Joint Manipulations;Passive range of motion;Dry needling    PT Next Visit Plan AAROM Shoulder Exercises, Analysis of use of BUE for functional tasks. Measure Shoulder ER + IR at 90 deg Abduction    PT Home Exercise Plan [email or web address]             Patient will benefit from skilled therapeutic intervention in order to improve the following deficits and impairments:  Decreased range of motion, Pain, Postural dysfunction, Impaired UE functional use  Visit Diagnosis: Chronic left shoulder pain     Problem List Patient Active Problem List   Diagnosis Date Noted   Psoriasis 06/01/2017   Polyp of sigmoid colon    Benign neoplasm of descending colon    Impingement syndrome of left shoulder 10/06/2016   Impingement syndrome of right shoulder 10/06/2016   BP (high blood pressure) 11/12/2015   Abnormal abdominal MRI 11/09/2015   Abnormal ECG 11/09/2015   Anxiety 11/09/2015   Obesity 01/30/2014   Smoking 01/30/2014   Avitaminosis D 05/03/2010   Obstructive apnea 06/09/2007   Hypercholesteremia 06/09/2007   Compulsive tobacco user syndrome 06/09/2007   Renal tubular disorder 05/19/2007   Diabetes mellitus, type 2 ([place]) 06/04/2006   Bradly Chris PT, DPT   10/10/2021, 5:11 PM  [facility] [address], [place], [address] Phone: [phone]   Fax:  [phone]  Name: Raegan Winders MRN: [id number] Date of Birth: [birth year]

## 2021-10-15 ENCOUNTER — Ambulatory Visit: Payer: Federal, State, Local not specified - PPO | Admitting: Physical Therapy

## 2021-10-15 ENCOUNTER — Telehealth: Payer: Self-pay | Admitting: Physical Therapy

## 2021-10-15 NOTE — Telephone Encounter (Signed)
Called pt to inquiry about her absence. Pt reports that she did not realize that she had an apt today, and that she will be at apt on Thursday.

## 2021-10-17 ENCOUNTER — Telehealth: Payer: Self-pay | Admitting: Physical Therapy

## 2021-10-17 ENCOUNTER — Ambulatory Visit: Payer: Federal, State, Local not specified - PPO | Admitting: Physical Therapy

## 2021-10-17 NOTE — Telephone Encounter (Signed)
Called pt to inquiry about absence. Pt reports that she thought her apt was at 4:30 and that she could not make it for 1:30 given that she had an obligation at 2:00. PT provided times and dates of the remainder of her apts.

## 2021-10-22 ENCOUNTER — Ambulatory Visit: Payer: Federal, State, Local not specified - PPO | Attending: Orthopedic Surgery | Admitting: Physical Therapy

## 2021-10-22 DIAGNOSIS — M25512 Pain in left shoulder: Secondary | ICD-10-CM | POA: Insufficient documentation

## 2021-10-22 DIAGNOSIS — G8929 Other chronic pain: Secondary | ICD-10-CM | POA: Insufficient documentation

## 2021-10-22 NOTE — Progress Notes (Signed)
°  AAROM Shoulder Exercises, Analysis of use of BUE for functional tasks. Measure Shoulder ER + IR at 90 deg Abduction     Access Code: [email or web address] URL: [email or web address] Date: 10/10/2021 Prepared by: Bradly Chris   Exercises Seated Shoulder Flexion AAROM with Pulley Behind - 1 x daily - 7 x weekly - 3 sets - 10 reps Seated Shoulder Abduction AAROM with Pulley Behind - 1 x daily - 7 x weekly - 3 sets - 10 reps

## 2021-10-22 NOTE — Therapy (Signed)
[facility] [address], [place], [address] Phone: [phone]   Fax:  [phone]  Physical Therapy Treatment  Patient Details  Name: Jacqueline Sparks MRN: [id number] Date of Birth: 1961-06-23 Referring Provider (PT): Dr. Sharol Given   Encounter Date: 10/22/2021   PT End of Session - 10/22/21 1431     Visit Number 2    Number of Visits 12    Date for PT Re-Evaluation 12/05/21    Authorization Type [place] - Visit Number 2    Authorization - Number of Visits 50    Progress Note Due on Visit 10    PT Start Time 1025    PT Stop Time 1500    PT Time Calculation (min) 45 min    Activity Tolerance Patient limited by pain    Behavior During Therapy [facility] for tasks assessed/performed             Past Medical History:  Diagnosis Date   Anxiety    Bronchitis    Diabetes mellitus without complication ([place])    GERD (gastroesophageal reflux disease)    Hyperlipidemia    Sleep apnea     Past Surgical History:  Procedure Laterality Date   CARPAL TUNNEL RELEASE  2001   as staated this was the left side, right side was completed in 2003   COLONOSCOPY WITH PROPOFOL N/A 02/17/2017   Procedure: COLONOSCOPY WITH PROPOFOL;  Surgeon: Lucilla Lame, MD;  Location: [facility] ENDOSCOPY;  Service: Endoscopy;  Laterality: N/A;   DILATION AND CURETTAGE OF UTERUS  2008   as stated uterine polyps   ROTATOR CUFF REPAIR Right 3004 and 2010   TUBAL LIGATION  1999    There were no vitals filed for this visit.   Subjective Assessment - 10/22/21 1426     Subjective Pt reports that she has not been able to sleep well and that she does the exercises when she can.    Pertinent History Patient is a 61 year old woman who presents complaining of persistent bilateral shoulder pain that is most recently worse on the left than the right.  Patient states she needs a right shoulder replacement but she states that her left is  hurting more at this time.     Patient states she has recently been diagnosed with psoriatic arthritis and is currently had 3 injections and will plan for injections every other week.    How long can you sit comfortably? N/a    How long can you stand comfortably? N/a    How long can you walk comfortably? N/a    Diagnostic tests CLINICAL DATA:  Left shoulder pain extending down the arm. Weakness.     EXAM:  MRI OF THE LEFT SHOULDER WITHOUT CONTRAST     TECHNIQUE:  Multiplanar, multisequence MR imaging of the shoulder was performed.  No intravenous contrast was administered.     COMPARISON:  Radiographs 09/18/2016     FINDINGS:  Rotator cuff: Prominent supraspinatus and subscapularis tendinopathy  with moderate infraspinatus tendinopathy. This tendinopathy involves  expansion of the supraspinatus and subscapularis tendons does [place]  along with accentuated signal. Suspected mild partial thickness  articular surface tearing of the subscapularis.     Muscles: Linear vertical accentuated edema signal in the lateral  deltoid, compatible with muscle strain or recent injection.     Biceps long head: Prominent expansion and increased signal of the  intra-articular segment compatible with prominent tendinopathy and  possibly  mild partial tearing. Mild medial displacement of the  intra-articular segment.     Acromioclavicular Joint: Moderate spurring and mild subcortical  marrow edema. Type II acromion. Mild subacromial subdeltoid bursitis  and mild subcoracoid bursitis.     Glenohumeral Joint: There is synovitis in the rotator interval.  Preserved articular cartilage.     Labrum:  Unremarkable     Bones: No significant extra-articular osseous abnormalities  identified.     Other: No supplemental non-categorized findings.     IMPRESSION:  1. Prominent supraspinatus and subscapularis tendinopathy with  moderate infraspinatus tendinopathy. Suspected mild partial  thickness articular surface tearing of the subscapularis.  2.  Prominent tendinopathy and possibly mild partial tearing of the  intra-articular segment of the long head of the biceps.  3. Mild subacromial subdeltoid bursitis and subcoracoid bursitis.  4. Synovitis in the rotator interval. This can be associated with  adhesive capsulitis.  5. Moderate degenerative AC joint arthropathy.        Electronically Signed    By: Van Clines M.D.    On: 09/16/2020 17:05    Patient Stated Goals Avoid surgery. Wants to be able to regain left shoulder to be able to drive, do her hair, and clean her bathroom.    Currently in Pain? Yes    Pain Score 5     Pain Location Shoulder    Pain Orientation Left;Right    Pain Descriptors / Indicators Aching    Pain Type Chronic pain    Pain Onset More than a month ago            THEREX:  Shoulder Flexion AAROM Pulleys 3 x 10 -Only able to reach 90 deg flexion Shoulder Abduction AAROM Pulleys 3 x 10  -Only able to reach 90 deg abduction Shoulder ER Isometric 2 x 5 with 30 sec holds  Shoulder Flexion Isometric 2 x 5 with 30 sec holds   Self Massage with Tennis Ball  -min VC for how to setup tennis ball against wall         PT Education - 10/22/21 1431     Education provided Yes    Education Details form and technique for appropriate exercise    Person(s) Educated Patient    Methods Explanation;Demonstration;Verbal cues;Handout    Comprehension Verbal cues required;Verbalized understanding;Returned demonstration              PT Short Term Goals - 10/22/21 1434       PT SHORT TERM GOAL #1   Title Patient will demonstrate understanding of HEP to improve outcomes.    Baseline 12/22: NT    Time 2    Period Weeks    Status On-going    Target Date 10/24/21               PT Long Term Goals - 10/22/21 1434       PT LONG TERM GOAL #1   Title Patient will have improved function and activity level as evidenced by an increase in FOTO score by 10 points or more.    Baseline 12/22: 36/54     Time 8    Period Weeks    Status On-going    Target Date 12/05/21      PT LONG TERM GOAL #2   Title Patient will be able to perform combined ER to reach occiput and to increased shoulder IR at 0 deg to perform an increased amount of UE activities such as self-grooming and to operate steering more  safely.    Baseline 12/22: Shoulder flexion AROM R/L 90 90 Shoulder flexion PROM R/L 120 120  Shoulder Abduction AROM R/L 90 90 Shoulder Abduction PROM R/L 120 120    Time 8    Period Weeks    Status On-going    Target Date 12/05/21      PT LONG TERM GOAL #3   Title Pt will increase strength of  by at least 1/2 MMT grade in order to demonstrate improvement in strength and function to drive more safely and complete self-care tasks.    Baseline 12/22: Shoulder flex R/L 4/4 Shoulder Abd 4/4 Shoulder ER at O deg Ab R/L 3+/3+ Shoulder IR at 0 deg abd R/L 4/4    Time 8    Period Weeks    Status On-going    Target Date 12/05/21                   Plan - 10/22/21 1432     Clinical Impression Statement Pt presents for f/u for bilateral shoulder pain. She continues to demonstrate limited ROM with pain. She was able to complete all exercises with limited pain response. Pt will continue to benefit from skilled PT to increase her shoulder ROM and strength to be able to carry groceries and fasten buttons on clothes to remain independent.    Personal Factors and Comorbidities Comorbidity 3+    Comorbidities T2DM, Psoriatic Arthritis, Tobacco Use    Examination-Activity Limitations Carry;Bathing;Hygiene/Grooming;Reach Overhead;Lift    Examination-Participation Restrictions Driving;Occupation;Cleaning;Laundry    Stability/Clinical Decision Making Stable/Uncomplicated    Clinical Decision Making Low    Rehab Potential Fair    Clinical Impairments Affecting Rehab Potential Chronicity of condition    PT Frequency 2x / week    PT Duration 4 weeks    PT Treatment/Interventions Therapeutic  exercise;Therapeutic activities;Neuromuscular re-education;Patient/family education;Manual techniques;Dry needling;Electrical Stimulation;Iontophoresis 4mg /ml Dexamethasone;Ultrasound;Aquatic Therapy    PT Next Visit Plan Continue shoulder isometrics and should mobility exercises: Finger walks    PT Home Exercise Plan [email or web address]    Consulted and Agree with Plan of Care Patient            HEP includes the following:   Access Code: [email or web address] URL: [email or web address] Date: 10/22/2021 Prepared by: Bradly Chris  Exercises Seated Shoulder Flexion AAROM with Pulley Behind - 1 x daily - 7 x weekly - 3 sets - 10 reps Seated Shoulder Abduction AAROM with Pulley Behind - 1 x daily - 7 x weekly - 3 sets - 10 reps Standing Isometric Shoulder Flexion with Doorway - Arm Bent - 1 x daily - 3 x weekly - 3 sets - 5 reps - 30 hold Standing Isometric Shoulder Abduction with Doorway - Arm Bent - 1 x daily - 7 x weekly - 2 sets - 5 reps - 30 hold   Patient will benefit from skilled therapeutic intervention in order to improve the following deficits and impairments:  Pain, Improper body mechanics, Decreased strength, Decreased range of motion  Visit Diagnosis: Chronic left shoulder pain     Problem List Patient Active Problem List   Diagnosis Date Noted   Psoriasis 06/01/2017   Polyp of sigmoid colon    Benign neoplasm of descending colon    Impingement syndrome of left shoulder 10/06/2016   Impingement syndrome of right shoulder 10/06/2016   BP (high blood pressure) 11/12/2015   Abnormal abdominal MRI 11/09/2015   Abnormal ECG 11/09/2015   Anxiety 11/09/2015   Obesity 01/30/2014   Smoking 01/30/2014   Avitaminosis  D 05/03/2010   Obstructive apnea 06/09/2007   Hypercholesteremia 06/09/2007   Compulsive tobacco user syndrome 06/09/2007   Renal tubular disorder 05/19/2007   Diabetes mellitus, type 2 ([place]) 06/04/2006   Bradly Chris PT, DPT  10/22/2021, 2:58 PM  [facility] [address], [place], [address] Phone: [phone]   Fax:  [phone]  Name: Jacqueline Sparks MRN: [id number] Date of Birth: 10-16-1961

## 2021-10-24 ENCOUNTER — Other Ambulatory Visit: Payer: Self-pay

## 2021-10-24 ENCOUNTER — Ambulatory Visit: Payer: Federal, State, Local not specified - PPO | Admitting: Physical Therapy

## 2021-10-28 ENCOUNTER — Encounter: Payer: Disability Insurance | Admitting: Physical Therapy

## 2021-10-29 ENCOUNTER — Ambulatory Visit: Payer: Federal, State, Local not specified - PPO | Admitting: Physical Therapy

## 2021-10-29 ENCOUNTER — Telehealth: Payer: Self-pay | Admitting: Physical Therapy

## 2021-10-29 NOTE — Telephone Encounter (Signed)
Attempted to call pt about absence from apt. Unable to reach and VM is full, so could not leave message.

## 2021-10-30 ENCOUNTER — Encounter: Payer: Disability Insurance | Admitting: Physical Therapy

## 2021-10-31 IMAGING — CR DG LUMBAR SPINE 2-3V
1 series · 3 of 3 positions shown · non-contrast
Comparison: Thoracic spine radiographs the same day. Lumbar
radiographs 11/13/2020. Lumbar MRI 09/05/2013.

CLINICAL DATA: 59-year-old female with back pain.

EXAM:
LUMBAR SPINE - 2-3 VIEW

[Series 1: dg lumbar spine 2-3 views · 0.14mm/px · 3 of 3 slices shown]
[im 1/3]
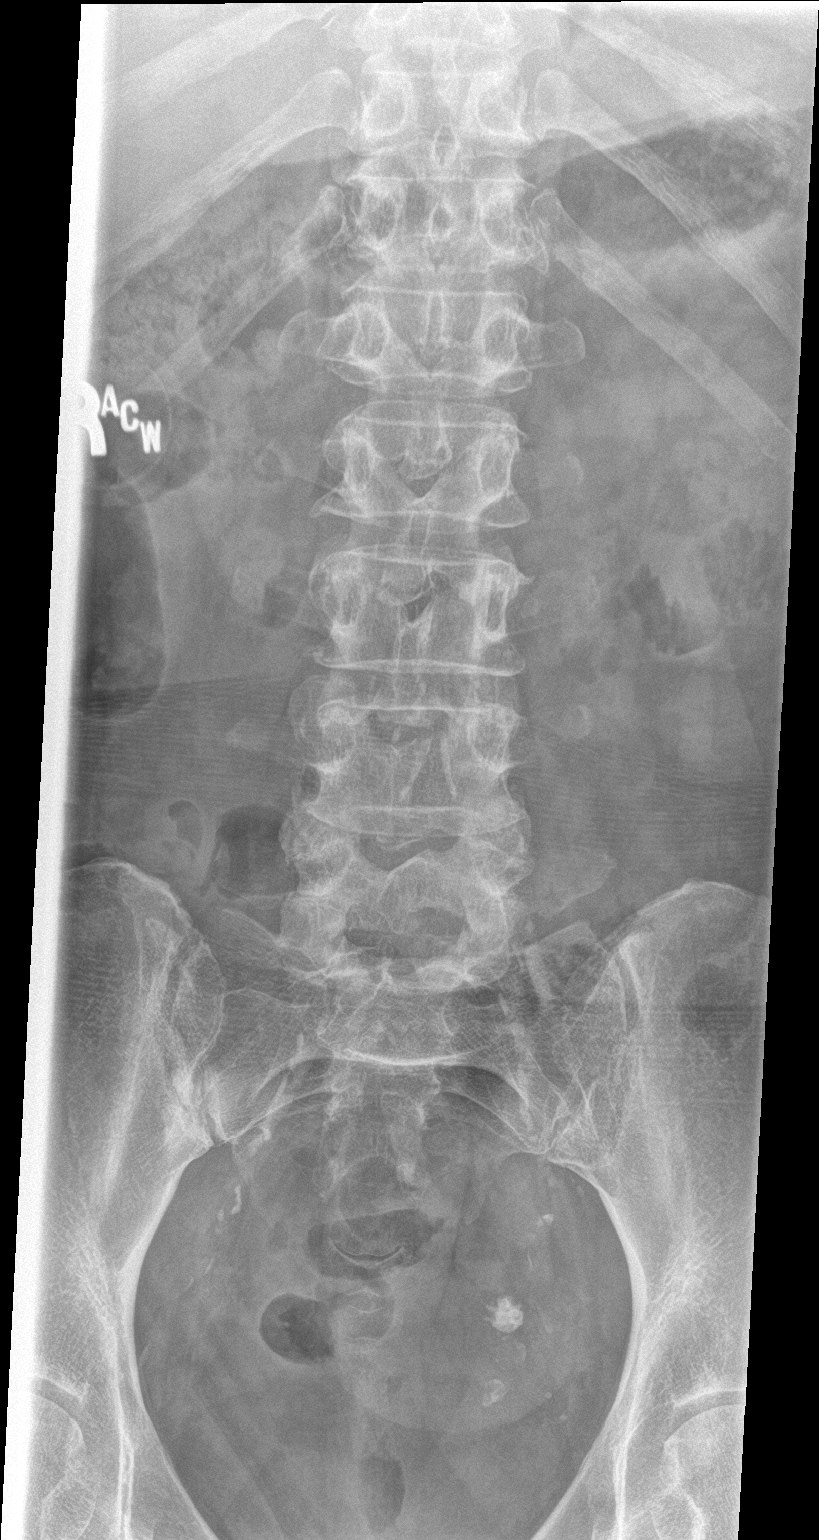
[im 2/3]
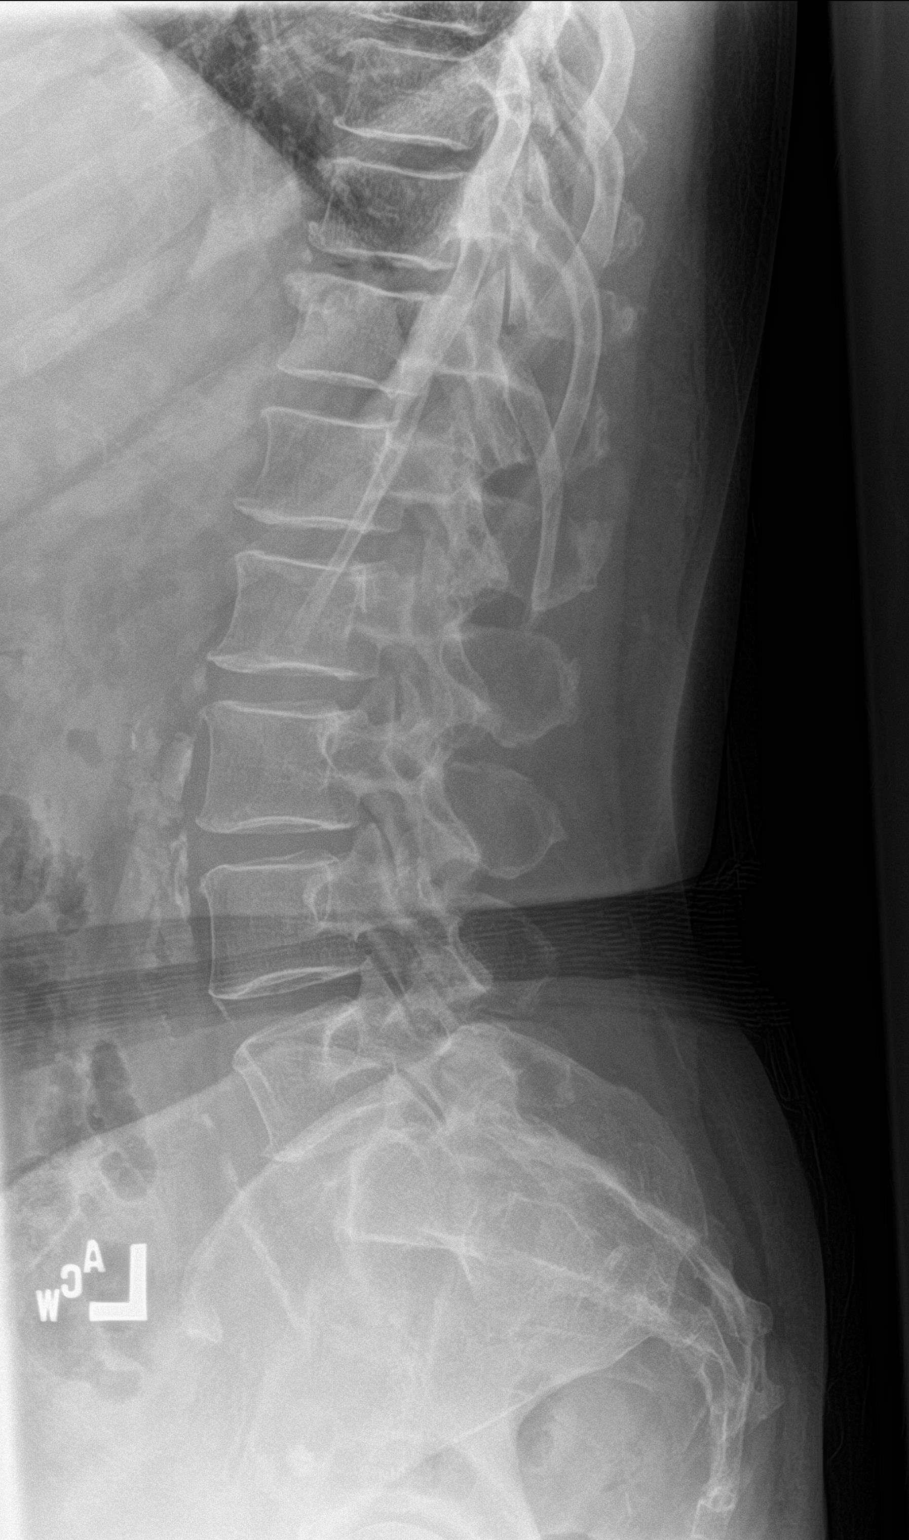
[im 3/3]
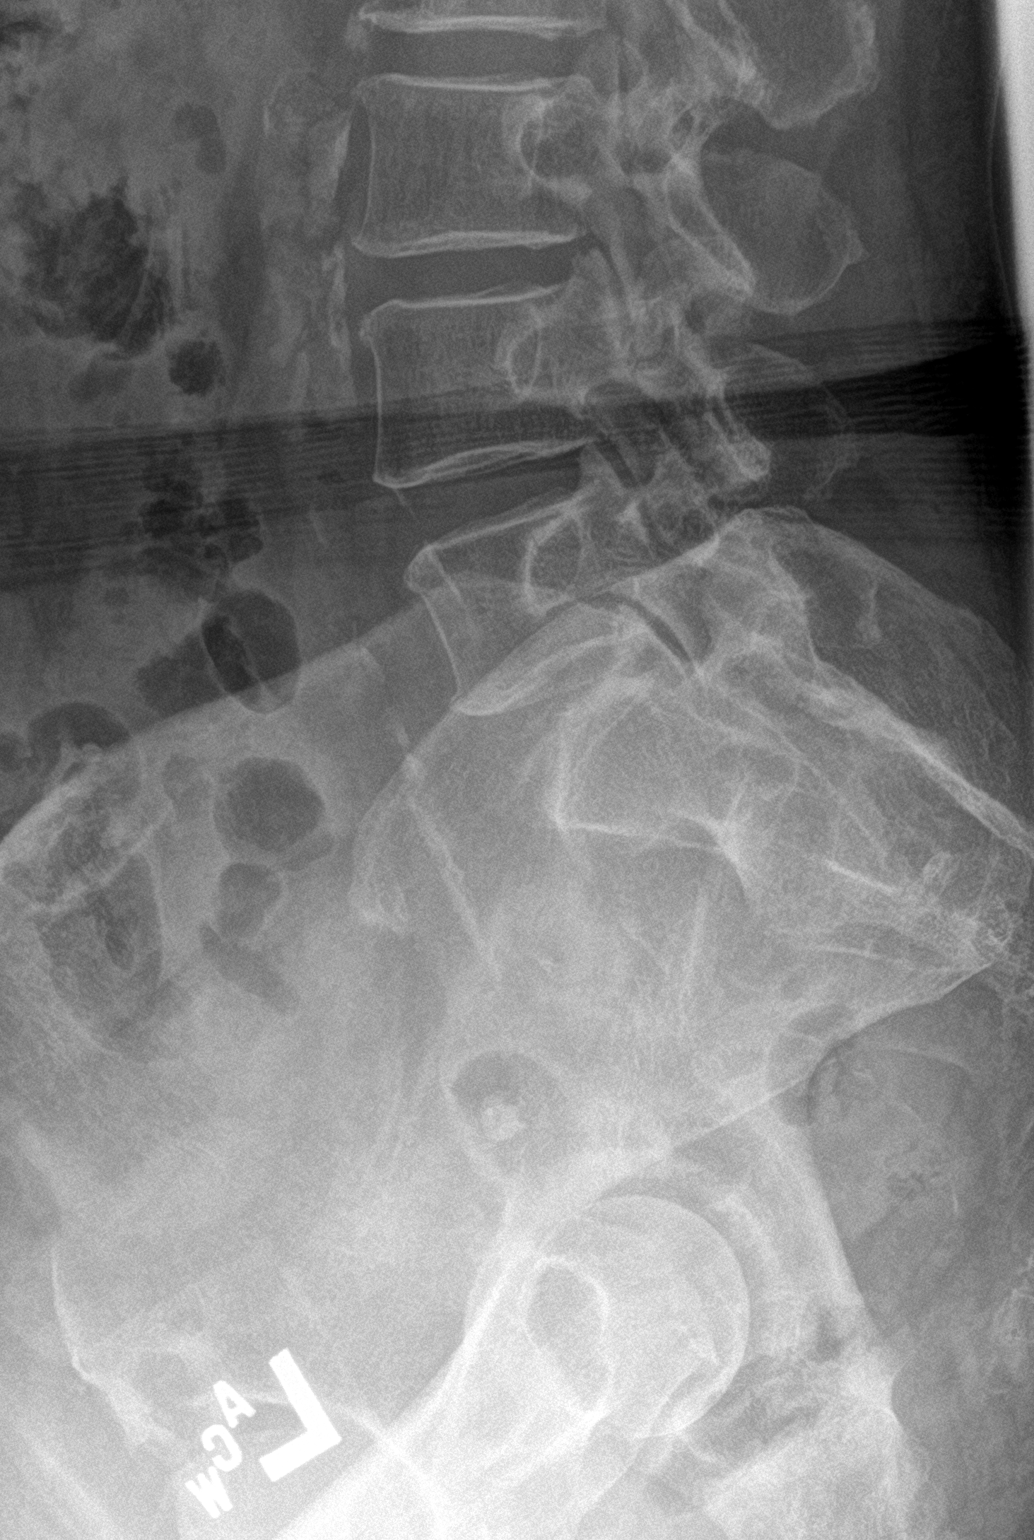

[3 of 3 positions shown; findings below may reference images not displayed]

FINDINGS: Normal lumbar segmentation, concordant with the thoracic spine
numbering today. Stable lumbar lordosis. No spondylolisthesis.
Preserved lumbar vertebral height. Bone mineralization is within
normal limits. No acute osseous abnormality identified.

Lumbar disc spaces appear relatively preserved and not significantly
changed since the 9385 MRI. Mild lumbar endplate spurring.

Visible sacrum and SI joints appear grossly normal.

Calcified aortic atherosclerosis. Small dystrophic calcifications in
the pelvis appear inconsequential. Otherwise negative visible
abdominal and pelvic visceral contours.
IMPRESSION: 1. No acute osseous abnormality identified and mild for age osseous
lumbar spine degeneration.
2.  Aortic Atherosclerosis ([id number]-XUX.X).

## 2021-10-31 IMAGING — CR DG THORACIC SPINE 2V
1 series · 3 of 3 positions shown · non-contrast
Comparison: None.

CLINICAL DATA: 59-year-old female with back pain.

EXAM:
THORACIC SPINE 2 VIEWS

[Series 1: dg thoracic spine 2 view · 0.14mm/px · 3 of 3 slices shown]
[im 1/3]
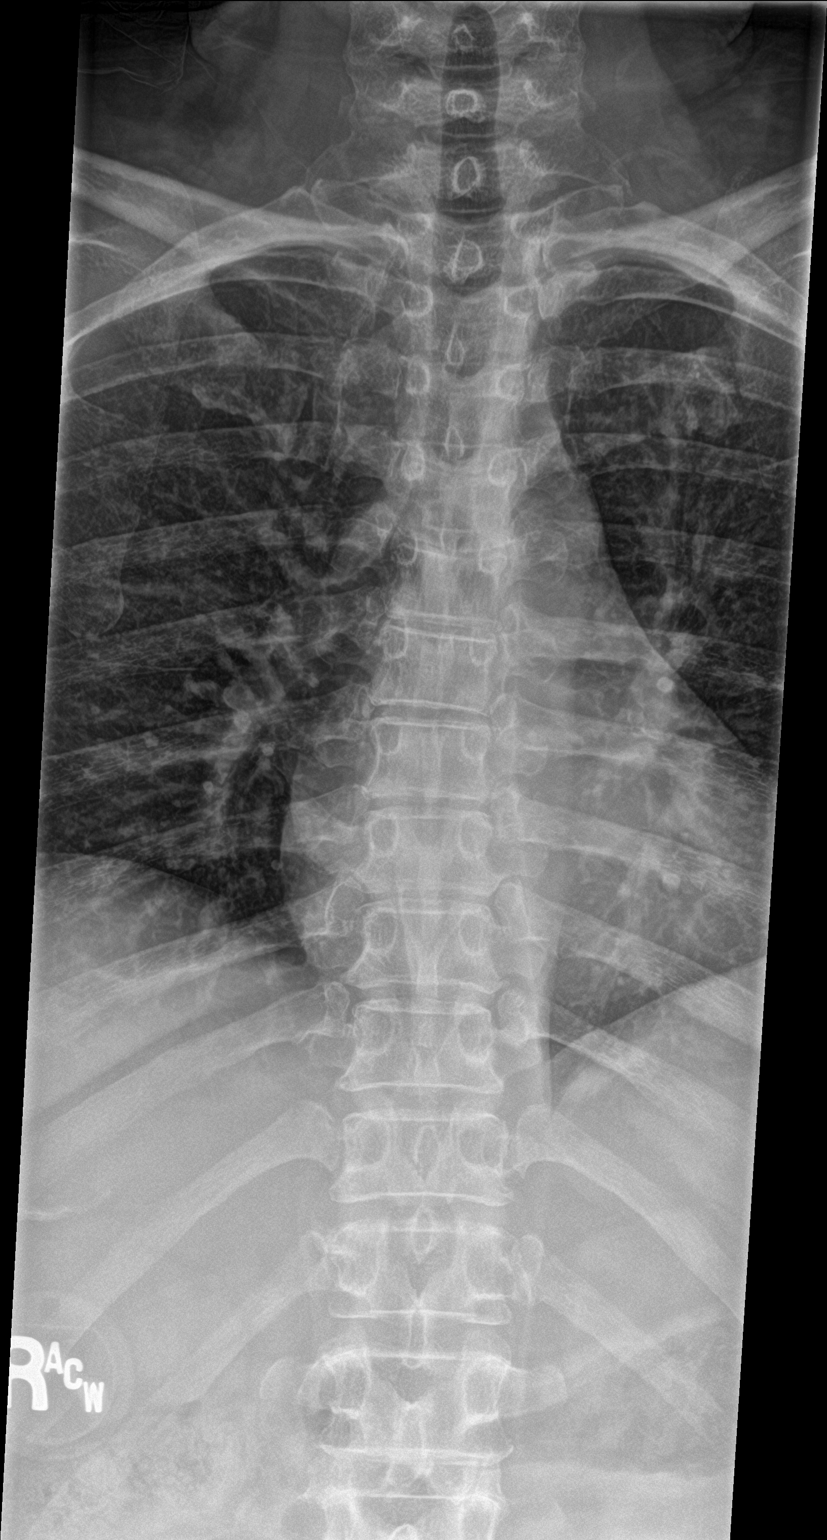
[im 2/3]
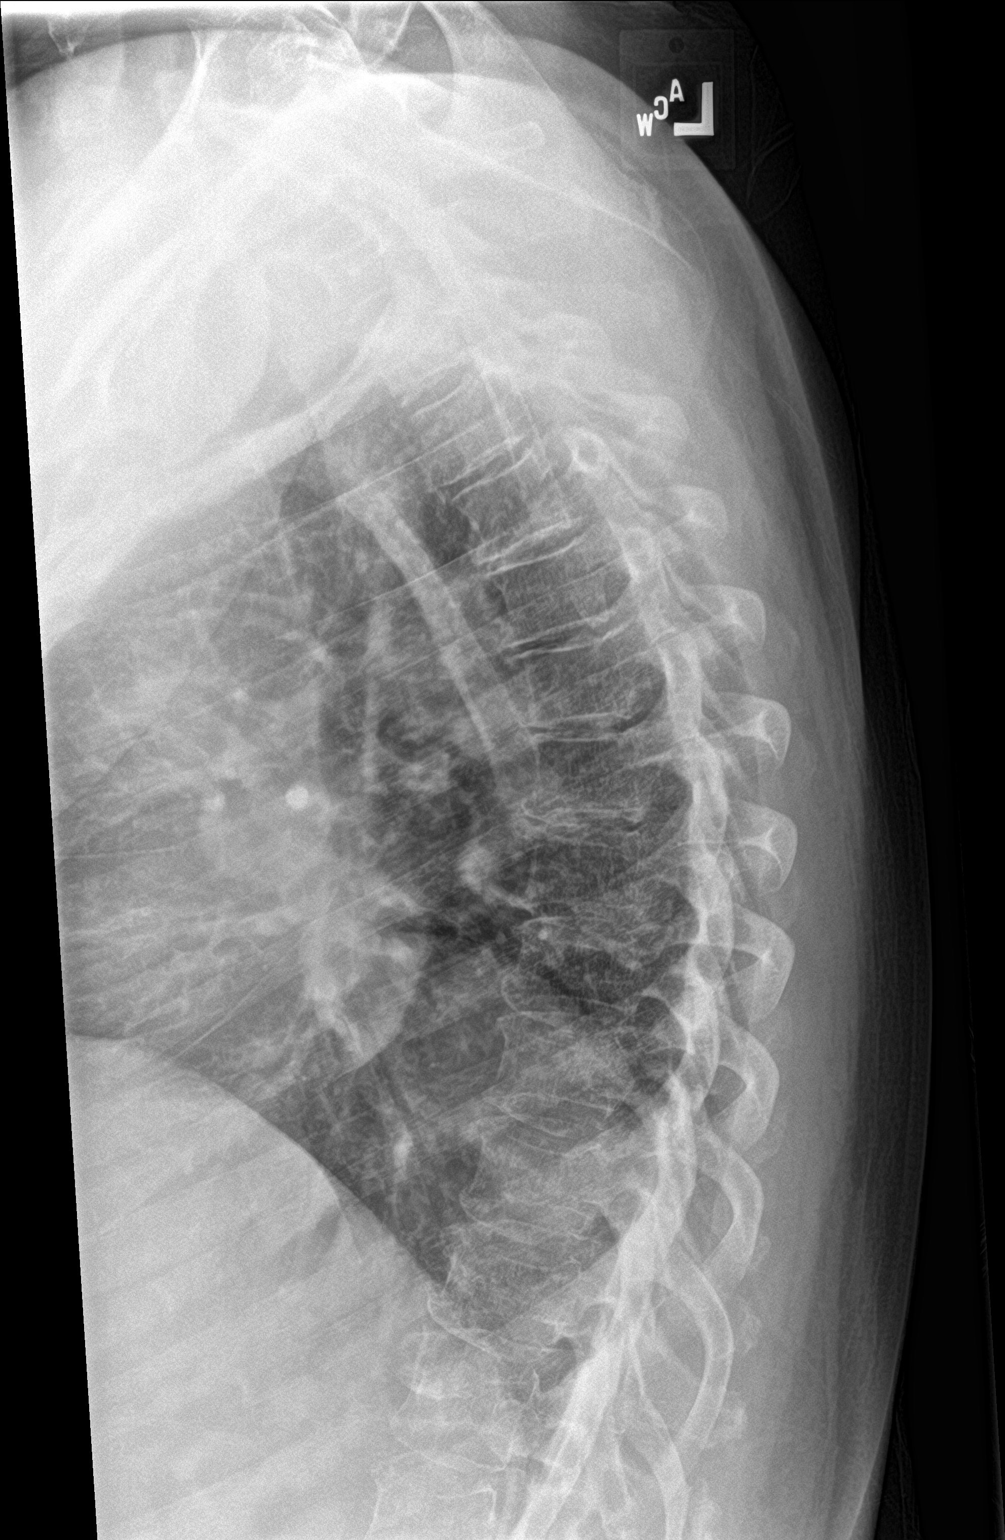
[im 3/3]
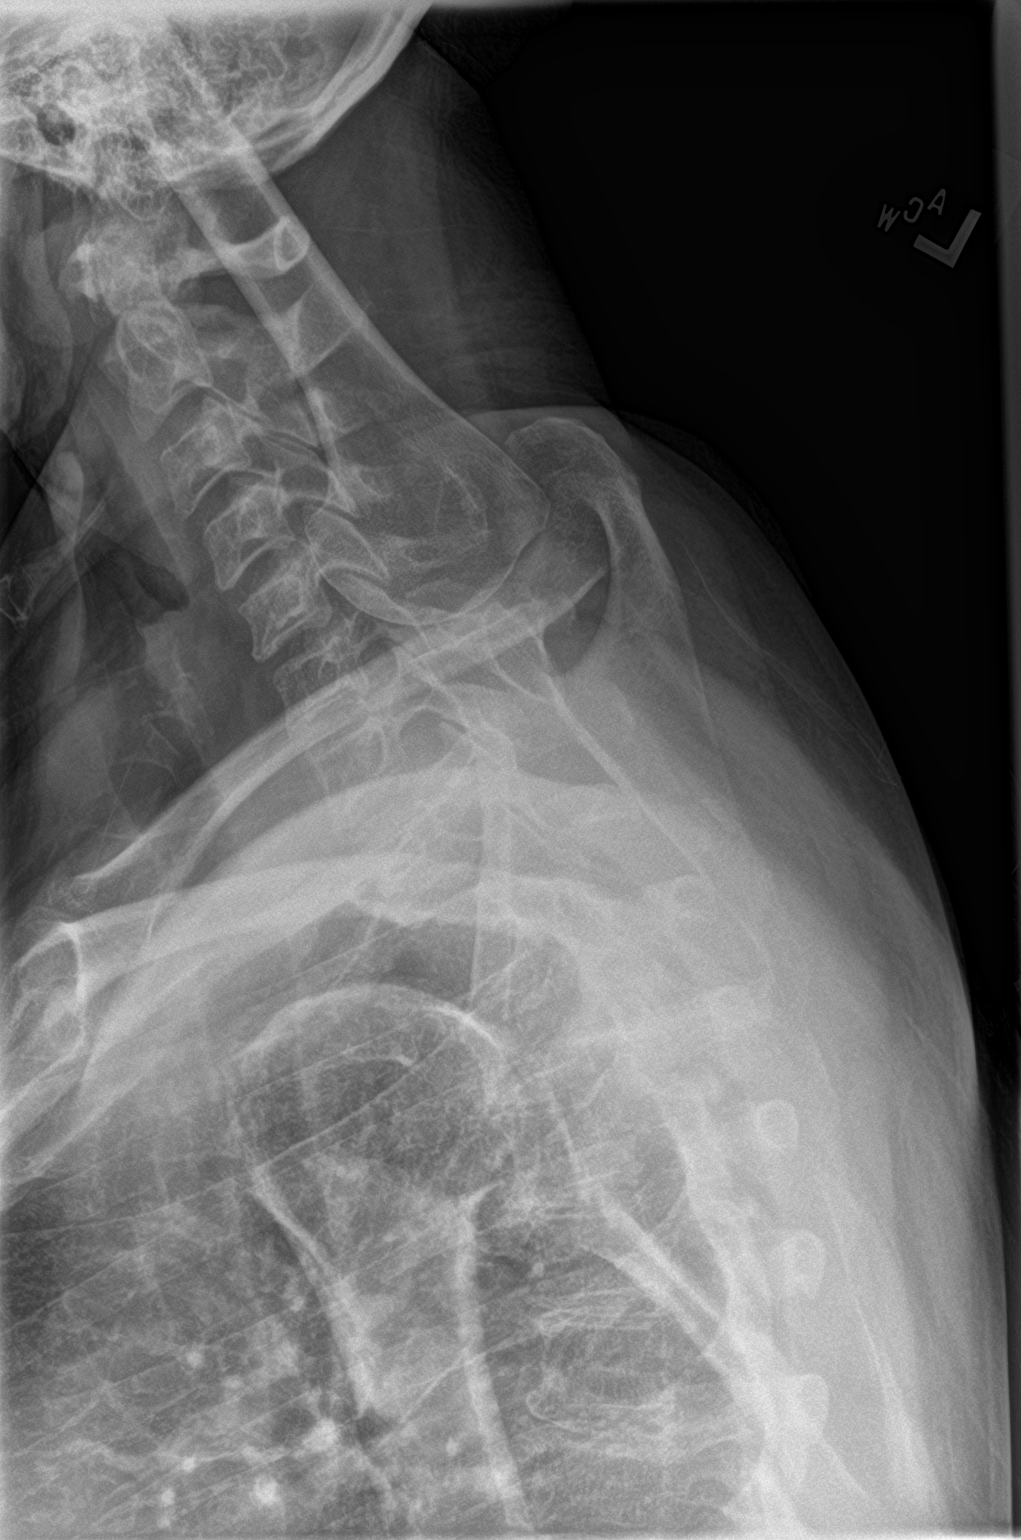

[3 of 3 positions shown; findings below may reference images not displayed]

FINDINGS: Normal thoracic segmentation. Bone mineralization is within normal
limits. Cervicothoracic junction alignment is within normal limits.
Preserved thoracic kyphosis. No spondylolisthesis. Preserved
thoracic vertebral height. Relatively preserved disc spaces,
although widespread mild thoracic endplate spurring is noted. No
acute osseous abnormality identified. Grossly intact visible
posterior ribs. Negative visible chest and upper abdominal visceral
contours.
IMPRESSION: 1. No acute osseous abnormality identified in the thoracic spine.
2. Widespread degenerative endplate spurring but relatively
preserved thoracic disc spaces.

## 2021-11-04 ENCOUNTER — Encounter: Payer: Disability Insurance | Admitting: Physical Therapy

## 2021-11-07 ENCOUNTER — Ambulatory Visit: Payer: Federal, State, Local not specified - PPO | Admitting: Physical Therapy

## 2021-11-07 ENCOUNTER — Telehealth: Payer: Self-pay | Admitting: Physical Therapy

## 2021-11-07 NOTE — Telephone Encounter (Signed)
Called pt to inquire about her missed apt. Shared remainder of schedule with pt who was awaiting someone to call her with schedule. Reminded pt that if she misses another apt she will be removed from schedule.

## 2021-11-12 ENCOUNTER — Other Ambulatory Visit: Payer: Self-pay

## 2021-11-12 ENCOUNTER — Ambulatory Visit: Payer: Federal, State, Local not specified - PPO

## 2021-11-12 DIAGNOSIS — G8929 Other chronic pain: Secondary | ICD-10-CM

## 2021-11-12 DIAGNOSIS — M25512 Pain in left shoulder: Secondary | ICD-10-CM | POA: Diagnosis not present

## 2021-11-12 NOTE — Therapy (Signed)
[facility] [address], [place], [address] Phone: [phone]   Fax:  [phone]  Physical Therapy Treatment  Patient Details  Name: Jacqueline Sparks MRN: [id number] Date of Birth: 06/16/1961 Referring Provider (PT): Dr. Sharol Given   Encounter Date: 11/12/2021   PT End of Session - 11/12/21 1430     Visit Number 3    Number of Visits 12    Date for PT Re-Evaluation 12/05/21    Authorization Type [place] [employer] WC primary; [employer] federal 2ndary    Authorization Time Period Cert: 62/26/33-3/54/56    Authorization - Visit Number 2    Authorization - Number of Visits 50    Progress Note Due on Visit 10    PT Start Time 2563    PT Stop Time 1458    PT Time Calculation (min) 38 min    Activity Tolerance No increased pain;Patient tolerated treatment well    Behavior During Therapy [facility] for tasks assessed/performed             Past Medical History:  Diagnosis Date   Anxiety    Bronchitis    Diabetes mellitus without complication ([place])    GERD (gastroesophageal reflux disease)    Hyperlipidemia    Sleep apnea     Past Surgical History:  Procedure Laterality Date   CARPAL TUNNEL RELEASE  2001   as staated this was the left side, right side was completed in 2003   COLONOSCOPY WITH PROPOFOL N/A 02/17/2017   Procedure: COLONOSCOPY WITH PROPOFOL;  Surgeon: Lucilla Lame, MD;  Location: [facility] ENDOSCOPY;  Service: Endoscopy;  Laterality: N/A;   DILATION AND CURETTAGE OF UTERUS  2008   as stated uterine polyps   ROTATOR CUFF REPAIR Right 3004 and 2010   TUBAL LIGATION  1999    There were no vitals filed for this visit.   Subjective Assessment - 11/12/21 1424     Subjective Pt has been absent since first week of January. Pt has started new injections for psoriatic arthritis, but has not noted any improvement in shoulder pain, also feels like her psoraiasis plaques are maybe worse. Pt has been working on the  tennis ball release as given, but has been working with the pulleys at home much less.    Pertinent History Patient is a 61 year old woman who presents complaining of persistent bilateral shoulder pain that is most recently worse on the left than the right.  Patient states she needs a right shoulder replacement but she states that her left is hurting more at this time.     Patient states she has recently been diagnosed with psoriatic arthritis and is currently had 3 injections and will plan for injections every other week.    Currently in Pain? Yes    Pain Score 5    Rt shoulder deep ache, left shoulder intermittent sharp pain with certain movements.            INTERVENTION THIS DATE:  *extended AA/ROM period due to high levels of guarding and motor inhibition  -AA/ROM on Nustep: seat 7 arms 7, level 1, SPM 3 ad lib, cued for gradual progression and moves ad lib to 15 -AA/ROM with pulleys x 4 minutes (flexion only due to guarding and time)  -Semi-recumbent isometric Left shoulder ADD green ball squeeze 10x5secH  -Semi-recumbent isometric Left shoulder extension (row) elbow into double pillow 10x5secH  -Semi-recumbent isometric bilat shoulder ER 15x3secH  -semi-recumbent bilateral neck scarf triceps  extensions YTB 1x15 (limited to middle range)    P/ROM:  Left shoulder flexion 94 degrees (120 degrees at evaluation)  Left shoulder ABDCT: 84 degrees (120 degrees at evaluation)    [facility] PT Assessment - 11/12/21 0001       ROM / Strength   AROM / PROM / Strength PROM      PROM   PROM Assessment Site Shoulder    Right/Left Shoulder Right;Left    Left Shoulder Flexion 94 Degrees    Left Shoulder ABduction 84 Degrees                PT Education - 11/12/21 1430     Education provided Yes    Education Details goals for consistency with HEP    Person(s) Educated Patient    Methods Explanation;Demonstration    Comprehension Verbalized understanding;Returned demonstration;Need  further instruction              PT Short Term Goals - 10/22/21 1434       PT SHORT TERM GOAL #1   Title Patient will demonstrate understanding of HEP to improve outcomes.    Baseline 12/22: NT    Time 2    Period Weeks    Status On-going    Target Date 10/24/21               PT Long Term Goals - 10/22/21 1434       PT LONG TERM GOAL #1   Title Patient will have improved function and activity level as evidenced by an increase in FOTO score by 10 points or more.    Baseline 12/22: 36/54    Time 8    Period Weeks    Status On-going    Target Date 12/05/21      PT LONG TERM GOAL #2   Title Patient will be able to perform combined ER to reach occiput and to increased shoulder IR at 0 deg to perform an increased amount of UE activities such as self-grooming and to operate steering more safely.    Baseline 12/22: Shoulder flexion AROM R/L 90 90 Shoulder flexion PROM R/L 120 120  Shoulder Abduction AROM R/L 90 90 Shoulder Abduction PROM R/L 120 120    Time 8    Period Weeks    Status On-going    Target Date 12/05/21      PT LONG TERM GOAL #3   Title Pt will increase strength of  by at least 1/2 MMT grade in order to demonstrate improvement in strength and function to drive more safely and complete self-care tasks.    Baseline 12/22: Shoulder flex R/L 4/4 Shoulder Abd 4/4 Shoulder ER at O deg Ab R/L 3+/3+ Shoulder IR at 0 deg abd R/L 4/4    Time 8    Period Weeks    Status On-going    Target Date 12/05/21                   Plan - 11/12/21 1436     Clinical Impression Statement Pt back from several weeks hiatus, CC largely unchanged. Picked up treatment where we left off. Some reassessment of ROM, but in general pt not remarkably worse or better. Will consider updating HEP next visit to include more isometric work at home. Per assessment today, P/ROM is far worse than when assessed at first visit, however it should be noted that pt has only made 3 visits so  far. Heavy emphasis in educating patient on importance of  daily use of pulleys or other ROM work.    Personal Factors and Comorbidities Comorbidity 3+    Comorbidities T2DM, Psoriatic Arthritis, Tobacco Use    Examination-Activity Limitations Carry;Bathing;Hygiene/Grooming;Reach Overhead;Lift    Examination-Participation Restrictions Driving;Occupation;Cleaning;Laundry    Stability/Clinical Decision Making Stable/Uncomplicated    Clinical Decision Making Low    Rehab Potential Fair    Clinical Impairments Affecting Rehab Potential Chronicity of condition    PT Frequency 2x / week    PT Duration 4 weeks    PT Treatment/Interventions Therapeutic exercise;Therapeutic activities;Neuromuscular re-education;Patient/family education;Manual techniques;Dry needling;Electrical Stimulation;Iontophoresis 4mg /ml Dexamethasone;Ultrasound;Aquatic Therapy    PT Next Visit Plan Continue shoulder isometrics and should mobility as tolerated    PT Home Exercise Plan [email or web address]    Consulted and Agree with Plan of Care Patient             Patient will benefit from skilled therapeutic intervention in order to improve the following deficits and impairments:  Pain, Improper body mechanics, Decreased strength, Decreased range of motion  Visit Diagnosis: Chronic left shoulder pain     Problem List Patient Active Problem List   Diagnosis Date Noted   Psoriasis 06/01/2017   Polyp of sigmoid colon    Benign neoplasm of descending colon    Impingement syndrome of left shoulder 10/06/2016   Impingement syndrome of right shoulder 10/06/2016   BP (high blood pressure) 11/12/2015   Abnormal abdominal MRI 11/09/2015   Abnormal ECG 11/09/2015   Anxiety 11/09/2015   Obesity 01/30/2014   Smoking 01/30/2014   Avitaminosis D 05/03/2010   Obstructive apnea 06/09/2007   Hypercholesteremia 06/09/2007   Compulsive tobacco user syndrome 06/09/2007   Renal tubular disorder 05/19/2007   Diabetes mellitus, type 2  ([place]) 06/04/2006    3:02 PM, 11/12/21 Etta Grandchild, PT, DPT Physical Therapist - [place] [phone] (Office)   [place] C, PT 11/12/2021, 2:58 PM  [facility] [address], [place], [address] Phone: [phone]   Fax:  [phone]  Name: Jacqueline Sparks MRN: [id number] Date of Birth: 03/31/1961

## 2021-11-13 ENCOUNTER — Encounter: Payer: Disability Insurance | Admitting: Physical Therapy

## 2021-11-18 ENCOUNTER — Encounter: Payer: Disability Insurance | Admitting: Physical Therapy

## 2021-11-19 ENCOUNTER — Other Ambulatory Visit: Payer: Self-pay

## 2021-11-19 ENCOUNTER — Ambulatory Visit: Payer: Federal, State, Local not specified - PPO

## 2021-11-19 DIAGNOSIS — M25512 Pain in left shoulder: Secondary | ICD-10-CM | POA: Diagnosis not present

## 2021-11-19 DIAGNOSIS — G8929 Other chronic pain: Secondary | ICD-10-CM

## 2021-11-19 NOTE — Therapy (Signed)
[facility] [address] [place], [address] Phone: [phone]   Fax:  [phone]  Physical Therapy Treatment  Patient Details  Name: Jacqueline Sparks MRN: [id number] Date of Birth: 1961/08/30 Referring Provider (PT): Dr. Sharol Given   Encounter Date: 11/19/2021   PT End of Session - 11/19/21 1430     Visit Number 4    Number of Visits 12    Date for PT Re-Evaluation 12/05/21    Authorization Type [place] [employer] WC primary; [employer] federal 2ndary    Authorization Time Period Cert: 09/98/33-06/13/04    Authorization - Visit Number 3    Authorization - Number of Visits 50    Progress Note Due on Visit 10    PT Start Time 3976   pt arrived late   PT Stop Time 1453    PT Time Calculation (min) 25 min    Activity Tolerance No increased pain;Patient tolerated treatment well    Behavior During Therapy [facility] for tasks assessed/performed             Past Medical History:  Diagnosis Date   Anxiety    Bronchitis    Diabetes mellitus without complication ([place])    GERD (gastroesophageal reflux disease)    Hyperlipidemia    Sleep apnea     Past Surgical History:  Procedure Laterality Date   CARPAL TUNNEL RELEASE  2001   as staated this was the left side, right side was completed in 2003   COLONOSCOPY WITH PROPOFOL N/A 02/17/2017   Procedure: COLONOSCOPY WITH PROPOFOL;  Surgeon: Lucilla Lame, MD;  Location: [facility] ENDOSCOPY;  Service: Endoscopy;  Laterality: N/A;   DILATION AND CURETTAGE OF UTERUS  2008   as stated uterine polyps   ROTATOR CUFF REPAIR Right 3004 and 2010   TUBAL LIGATION  1999    There were no vitals filed for this visit.   Subjective Assessment - 11/19/21 1428     Subjective Pt reports feeling fairly good today, no update since last visit. 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    Pertinent History Patient is a 61 year old woman who presents complaining of persistent  bilateral shoulder pain that is most recently worse on the left than the right.  Patient states she needs a right shoulder replacement but she states that her left is hurting more at this time.     Patient states she has recently been diagnosed with psoriatic arthritis and is currently had 3 injections and will plan for injections every other week.    Diagnostic tests CLINICAL DATA:  Left shoulder pain extending down the arm. Weakness.     EXAM:  MRI OF THE LEFT SHOULDER WITHOUT CONTRAST     TECHNIQUE:  Multiplanar, multisequence MR imaging of the shoulder was performed.  No intravenous contrast was administered.     COMPARISON:  Radiographs 09/18/2016     FINDINGS:  Rotator cuff: Prominent supraspinatus and subscapularis tendinopathy  with moderate infraspinatus tendinopathy. This tendinopathy involves  expansion of the supraspinatus and subscapularis tendons does DeLee  along with accentuated signal. Suspected mild partial thickness  articular surface tearing of the subscapularis.     Muscles: Linear vertical accentuated edema signal in the lateral  deltoid, compatible with muscle strain or recent injection.     Biceps long head: Prominent expansion and increased signal of the  intra-articular segment compatible with prominent tendinopathy and  possibly mild partial tearing. Mild medial displacement of  the  intra-articular segment.     Acromioclavicular Joint: Moderate spurring and mild subcortical  marrow edema. Type II acromion. Mild subacromial subdeltoid bursitis  and mild subcoracoid bursitis.     Glenohumeral Joint: There is synovitis in the rotator interval.  Preserved articular cartilage.     Labrum:  Unremarkable     Bones: No significant extra-articular osseous abnormalities  identified.     Other: No supplemental non-categorized findings.     IMPRESSION:  1. Prominent supraspinatus and subscapularis tendinopathy with  moderate infraspinatus tendinopathy. Suspected mild partial  thickness articular  surface tearing of the subscapularis.  2. Prominent tendinopathy and possibly mild partial tearing of the  intra-articular segment of the long head of the biceps.  3. Mild subacromial subdeltoid bursitis and subcoracoid bursitis.  4. Synovitis in the rotator interval. This can be associated with  adhesive capsulitis.  5. Moderate degenerative AC joint arthropathy.        Electronically Signed    By: Van Clines M.D.    On: 09/16/2020 17:05    Patient Stated Goals Avoid surgery. Wants to be able to regain left shoulder to be able to drive, do her hair, and clean her bathroom.    Currently in Pain? Yes    Pain Score 4    3-4 Left shoulder pain             INTERVENTION: -pulleys for shoulder flexion left 1x2 minutes -Left shoulder ABDCT rollout on physioball x2 minutes   -standing isometric elbow extension, fleixon, ABDCT, adduction, IR, and ER 15x5secH  *the above issued for home       PT Education - 11/19/21 1430     Education provided Yes    Education Details updated HEP for home.    Person(s) Educated Patient    Comprehension Verbal cues required;Need further instruction              PT Short Term Goals - 10/22/21 1434       PT SHORT TERM GOAL #1   Title Patient will demonstrate understanding of HEP to improve outcomes.    Baseline 12/22: NT    Time 2    Period Weeks    Status On-going    Target Date 10/24/21               PT Long Term Goals - 10/22/21 1434       PT LONG TERM GOAL #1   Title Patient will have improved function and activity level as evidenced by an increase in FOTO score by 10 points or more.    Baseline 12/22: 36/54    Time 8    Period Weeks    Status On-going    Target Date 12/05/21      PT LONG TERM GOAL #2   Title Patient will be able to perform combined ER to reach occiput and to increased shoulder IR at 0 deg to perform an increased amount of UE activities such as self-grooming and to operate steering more safely.     Baseline 12/22: Shoulder flexion AROM R/L 90 90 Shoulder flexion PROM R/L 120 120  Shoulder Abduction AROM R/L 90 90 Shoulder Abduction PROM R/L 120 120    Time 8    Period Weeks    Status On-going    Target Date 12/05/21      PT LONG TERM GOAL #3   Title Pt will increase strength of  by at least 1/2 MMT grade in order to demonstrate improvement  in strength and function to drive more safely and complete self-care tasks.    Baseline 12/22: Shoulder flex R/L 4/4 Shoulder Abd 4/4 Shoulder ER at O deg Ab R/L 3+/3+ Shoulder IR at 0 deg abd R/L 4/4    Time 8    Period Weeks    Status On-going    Target Date 12/05/21                   Plan - 11/19/21 1431     Clinical Impression Statement Continued with gentle ROM. Added in HEP for isometrics of shoulder in 4 directions in neutral. Pt remains very limited in ROM and strength for A/ROM.    Personal Factors and Comorbidities Comorbidity 3+    Comorbidities T2DM, Psoriatic Arthritis, Tobacco Use    Examination-Activity Limitations Carry;Bathing;Hygiene/Grooming;Reach Overhead;Lift    Examination-Participation Restrictions Driving;Occupation;Cleaning;Laundry    Stability/Clinical Decision Making Stable/Uncomplicated    Clinical Decision Making Low    Rehab Potential Fair    Clinical Impairments Affecting Rehab Potential Chronicity of condition    PT Frequency 2x / week    PT Duration 4 weeks    PT Treatment/Interventions Therapeutic exercise;Therapeutic activities;Neuromuscular re-education;Patient/family education;Manual techniques;Dry needling;Electrical Stimulation;Iontophoresis 4mg /ml Dexamethasone;Ultrasound;Aquatic Therapy    PT Next Visit Plan Continue shoulder isometrics and should mobility as tolerated    PT Home Exercise Plan [id number]    Consulted and Agree with Plan of Care Patient             Patient will benefit from skilled therapeutic intervention in order to improve the following deficits and impairments:  Pain,  Improper body mechanics, Decreased strength, Decreased range of motion  Visit Diagnosis: Chronic left shoulder pain     Problem List Patient Active Problem List   Diagnosis Date Noted   Psoriasis 06/01/2017   Polyp of sigmoid colon    Benign neoplasm of descending colon    Impingement syndrome of left shoulder 10/06/2016   Impingement syndrome of right shoulder 10/06/2016   BP (high blood pressure) 11/12/2015   Abnormal abdominal MRI 11/09/2015   Abnormal ECG 11/09/2015   Anxiety 11/09/2015   Obesity 01/30/2014   Smoking 01/30/2014   Avitaminosis D 05/03/2010   Obstructive apnea 06/09/2007   Hypercholesteremia 06/09/2007   Compulsive tobacco user syndrome 06/09/2007   Renal tubular disorder 05/19/2007   Diabetes mellitus, type 2 ([place]) 06/04/2006   2:37 PM, 11/19/21 Etta Grandchild, PT, DPT Physical Therapist - [place] [phone] (Office)   [place] C, PT 11/19/2021, 2:35 PM  [facility] [address] [place], [address] Phone: [phone]   Fax:  [phone]  Name: Jacqueline Sparks MRN: [id number] Date of Birth: 07/26/1961

## 2021-11-20 ENCOUNTER — Encounter: Payer: Disability Insurance | Admitting: Physical Therapy

## 2021-11-21 ENCOUNTER — Telehealth: Payer: Self-pay

## 2021-11-21 ENCOUNTER — Ambulatory Visit: Payer: Federal, State, Local not specified - PPO | Attending: Orthopedic Surgery | Admitting: Physical Therapy

## 2021-11-21 NOTE — Telephone Encounter (Signed)
Pt did not show for today's PT visit as scheduled. Pt did not contact clinic. Author reached out via telephone to patient. Pt reports she was unaware of appointment today. Pt is aware of appointment next week. Author informed patient this is her 5th no-show (12/27, 12/29, 1/10, 1/19, 2/2). Per policy pt will be permitted to have 1 scheduled visit at a time. Pt informed of this and acknowledges.   5:32 PM, 11/21/21 Etta Grandchild, PT, DPT Physical Therapist - [place] [phone] (Office)

## 2021-11-26 ENCOUNTER — Telehealth: Payer: Self-pay

## 2021-11-26 ENCOUNTER — Ambulatory Visit: Payer: Federal, State, Local not specified - PPO | Admitting: Physical Therapy

## 2021-11-26 NOTE — Telephone Encounter (Signed)
Patient did not show for appointment this date February 7 at 3:00 PM.  Patient was contacted last week after a no-show for an appointment then, author explained to patient over telephone our attendance policy, explained she would be limited to having 1 scheduled visit at a time due to 5 no-show appointments at that time.  This date marks no-show #6, patient has been here for 4 visits beginning in December.  Offer left a confidential voicemail explaining again our attendance policy and that patient would now be discharged from our services due to the number of no-show appointments.  3:41 PM, 11/26/21 Etta Grandchild, PT, DPT Physical Therapist - [place] [phone] (Office)

## 2021-11-28 ENCOUNTER — Ambulatory Visit: Payer: Federal, State, Local not specified - PPO | Admitting: Physical Therapy

## 2021-12-03 ENCOUNTER — Encounter: Payer: Federal, State, Local not specified - PPO | Admitting: Physical Therapy

## 2021-12-05 ENCOUNTER — Encounter: Payer: Federal, State, Local not specified - PPO | Admitting: Physical Therapy

## 2021-12-10 ENCOUNTER — Encounter: Payer: Federal, State, Local not specified - PPO | Admitting: Physical Therapy

## 2021-12-12 ENCOUNTER — Encounter: Payer: Federal, State, Local not specified - PPO | Admitting: Physical Therapy

## 2021-12-17 ENCOUNTER — Encounter: Payer: Federal, State, Local not specified - PPO | Admitting: Physical Therapy

## 2021-12-19 ENCOUNTER — Encounter: Payer: Federal, State, Local not specified - PPO | Admitting: Physical Therapy

## 2022-01-29 ENCOUNTER — Telehealth: Payer: Self-pay | Admitting: Family

## 2022-01-29 NOTE — Telephone Encounter (Signed)
Please copy 04/03/20 shoulder xray to CD. Let me know when ready and I'll pick up. Patient needs all records & imaging relating to the shoulder. Will sign release when comes in to pickup. ?

## 2022-01-30 NOTE — Telephone Encounter (Signed)
CD on my desk ready

## 2022-01-31 NOTE — Telephone Encounter (Signed)
IC, advised patient copy of records relating to shoulder & xray CD of shoulder ready to pick up at front desk. ?

## 2022-05-02 ENCOUNTER — Telehealth: Payer: Self-pay | Admitting: Orthopedic Surgery

## 2022-05-02 NOTE — Telephone Encounter (Signed)
Pt came in and showed thu protal on her phone that for [employer] [employer] need a a letter of explanation with objective explaining pt's complete comprehensive history of injury and why pt is no longer able to preform duties at work. Also dropped off attachment with duty status report to be filled out by [place] and signed by Dr. Sharol Given or [place]. Please call pt when ready for pick up. Pt phone number is [phone].

## 2022-05-06 NOTE — Telephone Encounter (Signed)
Pt is coming in on Thursday 05/08/22 to fill out paperwork with Dr.duda during her apt.

## 2022-05-08 ENCOUNTER — Ambulatory Visit (INDEPENDENT_AMBULATORY_CARE_PROVIDER_SITE_OTHER): Admitting: Orthopedic Surgery

## 2022-05-08 DIAGNOSIS — M7541 Impingement syndrome of right shoulder: Secondary | ICD-10-CM

## 2022-05-08 DIAGNOSIS — M7542 Impingement syndrome of left shoulder: Secondary | ICD-10-CM | POA: Diagnosis not present

## 2022-05-11 ENCOUNTER — Encounter: Payer: Self-pay | Admitting: Orthopedic Surgery

## 2022-05-11 NOTE — Progress Notes (Signed)
Office Visit Note   Patient: Jacqueline Sparks           Date of Birth: Feb 10, 1961           MRN: [id number] Visit Date: 05/08/2022              Requested by: No referring provider defined for this encounter. PCP: No primary care provider on file.  Chief Complaint  Patient presents with   Right Shoulder - Follow-up      HPI: Patient is a 61 year old woman who is seen in follow-up for impingement syndrome both shoulders.  Patient presents a [place] [employer] letter in reference to form CA-7 dated April 29, 2022.  With initial date of injury April 03, 2020.  The letter states there is insufficient medical evidence to support her temporary total disability for the claim.  April 03, 2020 through February 14, 2021.  Patient had work restrictions secondary to the impingement syndrome of her right shoulder.  Patient states that while at work she reached into her back pocket with her right arm and had immediate onset of worsening right shoulder pain.  Patient felt like she had to use her left arm to lift up her right arm.  Patient had acute decreased range of motion and decreased strength.  Patient was provided a note to be out of work until her symptoms resolved and a MRI scan was ordered.  MRI scan obtained showed the following:  1. Marked supraspinatus tendinosis with high-grade partial-thickness articular sided tear involving up to 75% of the tendon depth in the region of the critical zone. Additional high-grade near full-thickness insertional tear more anteriorly. 2. Marked infraspinatus tendinosis. 3. Moderate-severe glenohumeral joint osteoarthritis. 4. Moderate to large volume subacromial-subdeltoid bursal fluid with 1.0 cm loose body within the medial aspect of the bursal space. 5. Biceps long head tendon is not visualized, likely torn and Retracted.  Due to the degenerative changes of the right shoulder patient was kept out of work and placed on temporary total disability for the.   April 03, 2020 through February 14, 2021.  Assessment & Plan: Visit Diagnoses:  1. Impingement syndrome of left shoulder   2. Impingement syndrome of right shoulder     Plan: Patient will follow-up as needed.  Had discussed the possibility of a total shoulder arthroplasty to help resolve the right shoulder symptoms.  Follow-Up Instructions: Return if symptoms worsen or fail to improve.   Ortho Exam  Patient is alert, oriented, no adenopathy, well-dressed, normal affect, normal respiratory effort. Patient objective findings of the right shoulder showed pain with Neer and Hawkins impingement test pain with a drop arm test active abduction and flexion of 70 degrees with pain to palpation over the biceps tendon.  Imaging: No results found. No images are attached to the encounter.  Labs: Lab Results  Component Value Date   HGBA1C 7.5 (A) 04/09/2021   HGBA1C 8.4 (A) 12/28/2020   HGBA1C 8.4 (A) 09/28/2020   LABURIC 5.3 04/10/2017     Lab Results  Component Value Date   ALBUMIN 4.4 03/26/2020   ALBUMIN 4.2 03/03/2019   ALBUMIN 4.1 11/24/2016    No results found for: "MG" Lab Results  Component Value Date   VD25OH 42.8 03/26/2020    No results found for: "PREALBUMIN"    Latest Ref Rng & Units 03/26/2020    2:56 PM 03/03/2019    9:17 AM 11/24/2016    8:53 AM  CBC EXTENDED  WBC 3.4 - 10.8  x10E3/uL 7.5  7.5  8.6   RBC 3.77 - 5.28 x10E6/uL 5.06  4.62  4.95   Hemoglobin 11.1 - 15.9 g/dL 13.5  12.5  13.8   HCT 34.0 - 46.6 % 41.8  37.4  41.4   Platelets 150 - 450 x10E3/uL 308  305  375   NEUT# 1.4 - 7.0 x10E3/uL 3.2  3.4  3.9   Lymph# 0.7 - 3.1 x10E3/uL 3.7  3.2  4.1      There is no height or weight on file to calculate BMI.  Orders:  No orders of the defined types were placed in this encounter.  No orders of the defined types were placed in this encounter.    Procedures: No procedures performed  Clinical Data: No additional findings.  ROS:  All other systems  negative, except as noted in the HPI. Review of Systems  Objective: Vital Signs: There were no vitals taken for this visit.  Specialty Comments:  No specialty comments available.  PMFS History: Patient Active Problem List   Diagnosis Date Noted   Psoriasis 06/01/2017   Polyp of sigmoid colon    Benign neoplasm of descending colon    Impingement syndrome of left shoulder 10/06/2016   Impingement syndrome of right shoulder 10/06/2016   BP (high blood pressure) 11/12/2015   Abnormal abdominal MRI 11/09/2015   Abnormal ECG 11/09/2015   Anxiety 11/09/2015   Obesity 01/30/2014   Smoking 01/30/2014   Avitaminosis D 05/03/2010   Obstructive apnea 06/09/2007   Hypercholesteremia 06/09/2007   Compulsive tobacco user syndrome 06/09/2007   Renal tubular disorder 05/19/2007   Diabetes mellitus, type 2 ([place]) 06/04/2006   Past Medical History:  Diagnosis Date   Anxiety    Bronchitis    Diabetes mellitus without complication ([facility])    GERD (gastroesophageal reflux disease)    Hyperlipidemia    Sleep apnea     Family History  Problem Relation Age of Onset   Hyperlipidemia Mother    Hypertension Mother    Diabetes Mother    Diabetes Father    Hyperlipidemia Father    Hypertension Father     Past Surgical History:  Procedure Laterality Date   CARPAL TUNNEL RELEASE  2001   as staated this was the left side, right side was completed in 2003   COLONOSCOPY WITH PROPOFOL N/A 02/17/2017   Procedure: COLONOSCOPY WITH PROPOFOL;  Surgeon: Lucilla Lame, MD;  Location: [facility] ENDOSCOPY;  Service: Endoscopy;  Laterality: N/A;   DILATION AND CURETTAGE OF UTERUS  2008   as stated uterine polyps   ROTATOR CUFF REPAIR Right 3004 and 2010   TUBAL LIGATION  1999   Social History   Occupational History   Not on file  Tobacco Use   Smoking status: Every Day    Packs/day: 1.00    Years: 30.00    Total pack years: 30.00    Types: Cigarettes   Smokeless tobacco: Never  Vaping Use   Vaping Use:  Never used  Substance and Sexual Activity   Alcohol use: Yes    Comment: seldom use   Drug use: No   Sexual activity: Never

## 2022-09-02 ENCOUNTER — Telehealth: Payer: Self-pay | Admitting: Orthopedic Surgery

## 2022-09-02 NOTE — Telephone Encounter (Signed)
Received medical records release form from patient  

## 2022-09-29 ENCOUNTER — Other Ambulatory Visit: Payer: Self-pay | Admitting: Family Medicine

## 2022-09-29 DIAGNOSIS — Z1231 Encounter for screening mammogram for malignant neoplasm of breast: Secondary | ICD-10-CM

## 2022-11-13 ENCOUNTER — Ambulatory Visit
Admission: RE | Admit: 2022-11-13 | Discharge: 2022-11-13 | Disposition: A | Discharge status: Home / Self Care | Payer: Medicare Other | Source: Ambulatory Visit | Attending: Family Medicine | Admitting: Family Medicine

## 2022-11-13 DIAGNOSIS — Z1231 Encounter for screening mammogram for malignant neoplasm of breast: Secondary | ICD-10-CM

## 2022-12-29 ENCOUNTER — Other Ambulatory Visit: Payer: Self-pay

## 2022-12-29 ENCOUNTER — Emergency Department
Admission: EM | Admit: 2022-12-29 | Discharge: 2022-12-29 | Disposition: A | Discharge status: Home / Self Care | Payer: Federal, State, Local not specified - PPO | Attending: Emergency Medicine | Admitting: Emergency Medicine

## 2022-12-29 DIAGNOSIS — I10 Essential (primary) hypertension: Secondary | ICD-10-CM | POA: Insufficient documentation

## 2022-12-29 DIAGNOSIS — Z716 Tobacco abuse counseling: Secondary | ICD-10-CM | POA: Diagnosis not present

## 2022-12-29 DIAGNOSIS — F17200 Nicotine dependence, unspecified, uncomplicated: Secondary | ICD-10-CM | POA: Insufficient documentation

## 2022-12-29 DIAGNOSIS — E1165 Type 2 diabetes mellitus with hyperglycemia: Secondary | ICD-10-CM | POA: Insufficient documentation

## 2022-12-29 DIAGNOSIS — J449 Chronic obstructive pulmonary disease, unspecified: Secondary | ICD-10-CM | POA: Diagnosis not present

## 2022-12-29 DIAGNOSIS — E876 Hypokalemia: Secondary | ICD-10-CM | POA: Diagnosis not present

## 2022-12-29 LAB — COMPREHENSIVE METABOLIC PANEL
ALT: 12 U/L (ref 0–44)
AST: 21 U/L (ref 15–41)
Albumin: 3.6 g/dL (ref 3.5–5.0)
Alkaline Phosphatase: 107 U/L (ref 38–126)
Anion gap: 11 (ref 5–15)
BUN: <5 mg/dL — ABNORMAL LOW (ref 6–20)
CO2: 27 mmol/L (ref 22–32)
Calcium: 8.9 mg/dL (ref 8.9–10.3)
Chloride: 95 mmol/L — ABNORMAL LOW (ref 98–111)
Creatinine, Ser: 0.78 mg/dL (ref 0.44–1.00)
GFR, Estimated: >60 mL/min (ref >60)
Glucose, Bld: 500 mg/dL — ABNORMAL HIGH (ref 70–99)
Potassium: 3 mmol/L — ABNORMAL LOW (ref 3.5–5.1)
Sodium: 133 mmol/L — ABNORMAL LOW (ref 135–145)
Total Bilirubin: 0.5 mg/dL (ref 0.3–1.2)
Total Protein: 7.7 g/dL (ref 6.5–8.1)

## 2022-12-29 LAB — URINALYSIS, ROUTINE W REFLEX MICROSCOPIC
Bacteria, UA: NONE SEEN
Bilirubin Urine: NEGATIVE
Glucose, UA: >=500 mg/dL — AB
Hgb urine dipstick: NEGATIVE
Ketones, ur: NEGATIVE mg/dL
Leukocytes,Ua: NEGATIVE
Nitrite: NEGATIVE
Protein, ur: NEGATIVE mg/dL
Specific Gravity, Urine: 1.006 (ref 1.005–1.030)
Squamous Epithelial / HPF: NONE SEEN /HPF (ref 0–5)
pH: 6 (ref 5.0–8.0)

## 2022-12-29 LAB — CBC WITH DIFFERENTIAL/PLATELET
Abs Immature Granulocytes: 0.02 10*3/uL (ref 0.00–0.07)
Basophils Absolute: 0.1 10*3/uL (ref 0.0–0.1)
Basophils Relative: 1 %
Eosinophils Absolute: 0.2 10*3/uL (ref 0.0–0.5)
Eosinophils Relative: 2 %
HCT: 39.3 % (ref 36.0–46.0)
Hemoglobin: 13 g/dL (ref 12.0–15.0)
Immature Granulocytes: 0 %
Lymphocytes Relative: 46 %
Lymphs Abs: 3.4 10*3/uL (ref 0.7–4.0)
MCH: 26.9 pg (ref 26.0–34.0)
MCHC: 33.1 g/dL (ref 30.0–36.0)
MCV: 81.4 fL (ref 80.0–100.0)
Monocytes Absolute: 0.5 10*3/uL (ref 0.1–1.0)
Monocytes Relative: 7 %
Neutro Abs: 3.3 10*3/uL (ref 1.7–7.7)
Neutrophils Relative %: 44 %
Platelets: 318 10*3/uL (ref 150–400)
RBC: 4.83 MIL/uL (ref 3.87–5.11)
RDW: 13.5 % (ref 11.5–15.5)
WBC: 7.4 10*3/uL (ref 4.0–10.5)
nRBC: 0 % (ref 0.0–0.2)

## 2022-12-29 LAB — MAGNESIUM: Magnesium: 1.8 mg/dL (ref 1.7–2.4)

## 2022-12-29 LAB — CBG MONITORING, ED: Glucose-Capillary: 311 mg/dL — ABNORMAL HIGH (ref 70–99)

## 2022-12-29 MED ORDER — POTASSIUM CHLORIDE CRYS ER 20 MEQ PO TBCR
40.0000 meq | EXTENDED_RELEASE_TABLET | Freq: Once | ORAL | Status: AC
  Administered 2022-12-29: 40 meq via ORAL
  Filled 2022-12-29: qty 2

## 2022-12-29 MED ORDER — SODIUM CHLORIDE 0.9 % IV BOLUS
1000.0000 mL | Freq: Once | INTRAVENOUS | Status: AC
  Administered 2022-12-29: 1000 mL via INTRAVENOUS

## 2022-12-29 MED ORDER — NICOTINE 21 MG/24HR TD PT24: 21.0000 mg | MEDICATED_PATCH | Freq: Every day | TRANSDERMAL | 0 refills | Status: DC

## 2022-12-29 MED ORDER — NICOTINE POLACRILEX 4 MG MT LOZG: 4.0000 mg | LOZENGE | OROMUCOSAL | 0 refills | Status: DC | PRN

## 2022-12-29 MED ORDER — INSULIN ASPART 100 UNIT/ML IJ SOLN
8.0000 [IU] | Freq: Once | INTRAMUSCULAR | Status: AC
  Administered 2022-12-29: 8 [IU] via INTRAVENOUS
  Filled 2022-12-29: qty 1

## 2022-12-29 NOTE — ED Triage Notes (Signed)
Pt presents to the ED via POV due to abnormal labs. Pt was being seen at her PCP and had an elevated sugar and low potassium. Pt states she does not feel well. Pt states she has been out of her insulin supplies for awhile. Pt denies NVD. Pt NAD

## 2022-12-29 NOTE — Discharge Instructions (Signed)
Take all your diabetes medications as instructed by your primary doctor. Have your doctor schedule a follow-up appointment to recheck your blood tests including your potassium levels.  Use nicotine replacement therapy patches and lozenges as prescribed.  Thank you for choosing [place] for your health care today!  Please see your primary doctor this week for a follow up appointment.   Sometimes, in the early stages of certain disease courses it is difficult to detect in the emergency department evaluation -- so, it is important that you continue to monitor your symptoms and call your doctor right away or return to the emergency department if you develop any new or worsening symptoms.  Please go to the following website to schedule new (and existing) patient appointments:   [email or web address]  If you do not have a primary doctor try calling the following clinics to establish care:  If you have insurance:  [facility] [phone] [place] [address]   [facility]  [phone] [place] [place] [address]   If you do not have insurance:  [facility]  [phone] [address], [place] [address]   The following is another list of primary care offices in the area who are accepting new patients at this time.  Please reach out to one of them directly and let them know you would like to schedule an appointment to follow up on an Emergency Department visit, and/or to establish a new primary care provider (PCP).  There are likely other primary care clinics in the are who are accepting new patients, but this is an excellent place to start:  [place] physician: Dr Lavon Paganini [address] [place], [place] [address] [phone]  [facility] Lead Physician: Dr Steele Sizer [address], [place], [place] [address] [phone]  [place] Physician: Dr Park Liter [address] [place], [place] [address] [phone]  [facility] Lead Physician: Dr Dewaine Oats [place], [place], [place] [address] [phone]  [facility] Physician: Dr Halina Maidens [address] [place], [place] [address] [phone]   It was my pleasure to care for you today.   [place] Jacelyn Grip, MD

## 2022-12-29 NOTE — ED Provider Notes (Signed)
[facility] Provider Note    Event Date/Time   First MD Initiated Contact with Patient 12/29/22 1511     (approximate)   History   Abnormal Lab   HPI  Jacqueline Sparks is a 62 y.o. female   Past medical history of Sherlynn dependent type II diabetic, smoker 1 pack a day, hyperlipidemia, COPD who presents to the emergency department with high blood sugars.  She has not been using her insulin because she ran out of needles to check her fingerstick glucose and instead has been taking her glipizide and has been taking her other medications as well.  She has had polyuria and polydipsia but no other acute medical complaints.  She went to her primary doctor today who reviewed her diabetes medication issues and had her come to the emergency department due to high blood sugar and low potassium.  Aside from medication issues and polyuria and polydipsia and occasional muscle cramps she has no other acute medical complaints.  She states that she had all of her medications now refilled by her primary doctor and accessible.  She does state that she came to the emergency department to check on the potassium levels and to check if she is in DKA.  Independent Historian contributed to assessment above: Sister who is at bedside  External Medical Documents Reviewed: Internal medicine office visit note from earlier today dated 12/29/2022 addressing her hyperglycemia and potassium level      Physical Exam   Triage Vital Signs: ED Triage Vitals  Enc Vitals Group     BP 12/29/22 1335 (!) 155/79     Pulse Rate 12/29/22 1335 70     Resp 12/29/22 1335 18     Temp 12/29/22 1335 98.4 F (36.9 C)     Temp Source 12/29/22 1335 Oral     SpO2 12/29/22 1335 99 %     Weight 12/29/22 1336 158 lb 15.2 oz (72.1 kg)     Height 12/29/22 1336 '5\' 3"'$  (1.6 m)     Head Circumference --      Peak Flow --      Pain Score 12/29/22 1336 0     Pain Loc --      Pain Edu? --      Excl. in [place]? --      Most recent vital signs: Vitals:   12/29/22 1335 12/29/22 1513  BP: (!) 155/79 (!) 142/61  Pulse: 70 70  Resp: 18 15  Temp: 98.4 F (36.9 C)   SpO2: 99% 98%    General: Awake, no distress.  CV:  Good peripheral perfusion.  Resp:  Normal effort.  Abd:  No distention.  Other:  Comfortable appearing with normal vital signs mild hypertension otherwise within normal limits skin appears warm well-perfused patient appears comfortable nontoxic lungs clear abdomen soft nontender.   ED Results / Procedures / Treatments   Labs (all labs ordered are listed, but only abnormal results are displayed) Labs Reviewed  COMPREHENSIVE METABOLIC PANEL - Abnormal; Notable for the following components:      Result Value   Sodium 133 (*)    Potassium 3.0 (*)    Chloride 95 (*)    Glucose, Bld 500 (*)    BUN <5 (*)    All other components within normal limits  URINALYSIS, ROUTINE W REFLEX MICROSCOPIC - Abnormal; Notable for the following components:   Color, Urine COLORLESS (*)    APPearance CLEAR (*)    Glucose, UA >=500 (*)  All other components within normal limits  CBC WITH DIFFERENTIAL/PLATELET  MAGNESIUM     I ordered and reviewed the above labs they are notable for potassium levels 3.0 and glucose is 500 with a normal anion gap.  EKG  ED ECG REPORT I, Lucillie Garfinkel, the attending physician, personally viewed and interpreted this ECG.   Date: 12/29/2022  EKG Time: 1629  Rate: 66  Rhythm: sinus  Axis: nl  Intervals:QTC is 440   ST&T Change: no acute ischemic changes      PROCEDURES:  Critical Care performed: No  Procedures   MEDICATIONS ORDERED IN ED: Medications  insulin aspart (novoLOG) injection 8 Units (has no administration in time range)  potassium chloride SA (KLOR-CON M) CR tablet 40 mEq (has no administration in time range)  potassium chloride SA (KLOR-CON M) CR tablet 40 mEq (40 mEq Oral Given 12/29/22 1531)  sodium chloride 0.9 % bolus 1,000 mL (1,000  mLs Intravenous New Bag/Given 12/29/22 1631)    IMPRESSION / MDM / ASSESSMENT AND PLAN / ED COURSE  I reviewed the triage vital signs and the nursing notes.                                Patient's presentation is most consistent with acute presentation with potential threat to life or bodily function.  Differential diagnosis includes, but is not limited to, hyperglycemia, medication nonadherence, DKA, hypokalemia, other electrolyte disturbance, AKI   The patient is on the cardiac monitor to evaluate for evidence of arrhythmia and/or significant heart rate changes.  MDM: This is a patient with type 2 diabetes poorly manage due to medication nonadherence in the setting of not having the correct supplies and not taking insulin with hypoglycemic without evidence of DKA.  Vital signs normal.  Otherwise asymptomatic other than polyuria polydipsia and occasional muscle cramps likely due to her slightly low potassium.  Potassium repletion, double dose with 40 mill equivalents oral given expected decline in potassium with insulin administration today and ongoing in the next couple days..  Will give her some fluids today as well as some insulin, check other electrolytes for repletion as needed and she can follow-up with her primary doctor for ongoing management of her diabetes.  Continues to smoke 1 pack a day.  I ordered nicotine replacement for her-- I spent 5 minutes counseling this patient on smoking cessation.  We spoke about the patient's current tobacco use, impact of smoking, assessed willingness to quit, methods for cessation including medical management and nicotine replacement therapy (which I prescribed to the patient) and advised follow-up with primary doctor to continue to address smoking cessation.   I considered hospitalization for admission or observation overall well appearance no DKA vital signs stable I think outpatient follow-up most appropriate at this time but I gave her return  precautions for any new or worsening symptoms.        FINAL CLINICAL IMPRESSION(S) / ED DIAGNOSES   Final diagnoses:  Hyperglycemia due to diabetes mellitus ([place])  Hypokalemia  Encounter for smoking cessation counseling     Rx / DC Orders   ED Discharge Orders          Ordered    nicotine (NICODERM CQ - DOSED IN MG/24 HOURS) 21 mg/24hr patch  Daily        12/29/22 1538    nicotine polacrilex (NICOTINE MINI) 4 MG lozenge  As needed  12/29/22 1538             Note:  This document was prepared using Dragon voice recognition software and may include unintentional dictation errors.    Lucillie Garfinkel, MD 12/29/22 [phone]

## 2023-05-05 ENCOUNTER — Ambulatory Visit (INDEPENDENT_AMBULATORY_CARE_PROVIDER_SITE_OTHER): Payer: Medicare Other | Admitting: Orthopedic Surgery

## 2023-05-05 DIAGNOSIS — M7541 Impingement syndrome of right shoulder: Secondary | ICD-10-CM

## 2023-05-24 ENCOUNTER — Encounter: Payer: Self-pay | Admitting: Orthopedic Surgery

## 2023-05-24 NOTE — Progress Notes (Signed)
Office Visit Note   Patient: Jacqueline Sparks           Date of Birth: 06-10-1961           MRN: [id number] Visit Date: 05/05/2023              Requested by: Marisue Ivan, MD [phone] [facility] [address] [facility] [place],  [place] [address] PCP: Marisue Ivan, MD  Chief Complaint  Patient presents with   Right Shoulder - Pain      HPI: Patient is a 62 year old woman who has had a history with bilateral shoulder pain.  Presents in follow-up for her right shoulder.  Patient states she is currently on disability.  Patient states he has recently been diagnosed with psoriatic arthritis and receiving injections which she feels helps with her shoulders.  Assessment & Plan: Visit Diagnoses:  1. Impingement syndrome of right shoulder     Plan: Released without restrictions follow-up as needed.  Follow-Up Instructions: Return if symptoms worsen or fail to improve.   Ortho Exam  Patient is alert, oriented, no adenopathy, well-dressed, normal affect, normal respiratory effort. Examination of the right shoulder patient has full range of motion she has internal and external rotation at 90 degrees there is no crepitation with range of motion.  Biceps tendon is not tender to palpation.  Imaging: No results found. No images are attached to the encounter.  Labs: Lab Results  Component Value Date   HGBA1C 7.5 (A) 04/09/2021   HGBA1C 8.4 (A) 12/28/2020   HGBA1C 8.4 (A) 09/28/2020   LABURIC 5.3 04/10/2017     Lab Results  Component Value Date   ALBUMIN 3.6 12/29/2022   ALBUMIN 4.4 03/26/2020   ALBUMIN 4.2 03/03/2019    Lab Results  Component Value Date   MG 1.8 12/29/2022   Lab Results  Component Value Date   VD25OH 42.8 03/26/2020    No results found for: "PREALBUMIN"    Latest Ref Rng & Units 12/29/2022    1:40 PM 03/26/2020    2:56 PM 03/03/2019    9:17 AM  CBC EXTENDED  WBC 4.0 - 10.5 K/uL 7.4  7.5  7.5   RBC 3.87 - 5.11 MIL/uL 4.83  5.06  4.62    Hemoglobin 12.0 - 15.0 g/dL 78.2  95.6  21.3   HCT 36.0 - 46.0 % 39.3  41.8  37.4   Platelets 150 - 400 K/uL 318  308  305   NEUT# 1.7 - 7.7 K/uL 3.3  3.2  3.4   Lymph# 0.7 - 4.0 K/uL 3.4  3.7  3.2      There is no height or weight on file to calculate BMI.  Orders:  No orders of the defined types were placed in this encounter.  No orders of the defined types were placed in this encounter.    Procedures: No procedures performed  Clinical Data: No additional findings.  ROS:  All other systems negative, except as noted in the HPI. Review of Systems  Objective: Vital Signs: There were no vitals taken for this visit.  Specialty Comments:  No specialty comments available.  PMFS History: Patient Active Problem List   Diagnosis Date Noted   Psoriasis 06/01/2017   Polyp of sigmoid colon    Benign neoplasm of descending colon    Impingement syndrome of left shoulder 10/06/2016   Impingement syndrome of right shoulder 10/06/2016   BP (high blood pressure) 11/12/2015   Abnormal abdominal MRI 11/09/2015   Abnormal ECG  11/09/2015   Anxiety 11/09/2015   Obesity 01/30/2014   Smoking 01/30/2014   Avitaminosis D 05/03/2010   Obstructive apnea 06/09/2007   Hypercholesteremia 06/09/2007   Compulsive tobacco user syndrome 06/09/2007   Renal tubular disorder 05/19/2007   Diabetes mellitus, type 2 (HCC) 06/04/2006   Past Medical History:  Diagnosis Date   Anxiety    Bronchitis    Diabetes mellitus without complication (HCC)    GERD (gastroesophageal reflux disease)    Hyperlipidemia    Sleep apnea     Family History  Problem Relation Age of Onset   Hyperlipidemia Mother    Hypertension Mother    Diabetes Mother    Diabetes Father    Hyperlipidemia Father    Hypertension Father     Past Surgical History:  Procedure Laterality Date   BREAST BIOPSY Left 2016   2 areas, ribbon and coil clips, benign   BREAST EXCISIONAL BIOPSY Right    age 71, benign, unable to see  scar   CARPAL TUNNEL RELEASE  10/21/1999   as staated this was the left side, right side was completed in 2003   COLONOSCOPY WITH PROPOFOL N/A 02/17/2017   Procedure: COLONOSCOPY WITH PROPOFOL;  Surgeon: Midge Minium, MD;  Location: [facility] ENDOSCOPY;  Service: Endoscopy;  Laterality: N/A;   DILATION AND CURETTAGE OF UTERUS  10/20/2006   as stated uterine polyps   ROTATOR CUFF REPAIR Right 3004 and 2010   TUBAL LIGATION  10/20/1997   Social History   Occupational History   Not on file  Tobacco Use   Smoking status: Every Day    Current packs/day: 1.00    Average packs/day: 1 pack/day for 30.0 years (30.0 ttl pk-yrs)    Types: Cigarettes   Smokeless tobacco: Never  Vaping Use   Vaping status: Never Used  Substance and Sexual Activity   Alcohol use: Yes    Comment: seldom use   Drug use: No   Sexual activity: Never

## 2023-08-13 DIAGNOSIS — E1165 Type 2 diabetes mellitus with hyperglycemia: Secondary | ICD-10-CM | POA: Diagnosis not present

## 2023-09-10 DIAGNOSIS — L409 Psoriasis, unspecified: Secondary | ICD-10-CM | POA: Diagnosis not present

## 2023-09-10 DIAGNOSIS — Z7960 Long term (current) use of unspecified immunomodulators and immunosuppressants: Secondary | ICD-10-CM | POA: Diagnosis not present

## 2023-09-10 DIAGNOSIS — L4050 Arthropathic psoriasis, unspecified: Secondary | ICD-10-CM | POA: Diagnosis not present

## 2023-12-07 ENCOUNTER — Other Ambulatory Visit: Payer: Self-pay | Admitting: Internal Medicine

## 2023-12-07 DIAGNOSIS — E782 Mixed hyperlipidemia: Secondary | ICD-10-CM

## 2023-12-17 NOTE — Progress Notes (Signed)
 Rheumatology Follow Up Note  Chief Complaint  Patient presents with  . Psoriatic Arthritis      Subjective:HPI   Jacqueline Sparks is a 63 y.o. female is here today for follow up of psoriatic arthritis. The patient's allergies, current medications, past family history, past medical history, past social history, past surgical history and problem list were reviewed and updated as appropriate.   She is taking the Tremfya and methotrexate. The psoriasis has improved. She has no joint swelling or pain since last evaluation. She does get some lower back pain and stiffness. She has no fever, infection or GI trouble. She has no inflammation of the eyes.   Review of Systems:   Review of Systems  Constitutional:  Positive for fatigue.  HENT:  Negative for mouth sores and trouble swallowing.        Neg: Dry Mouth  Eyes:  Negative for redness.       Neg: Dry Eyes  Respiratory:  Positive for cough. Negative for shortness of breath.   Cardiovascular:  Negative for chest pain and leg swelling.  Gastrointestinal:  Negative for constipation, diarrhea and nausea.  Endocrine: Positive for polydipsia. Negative for cold intolerance and heat intolerance.  Genitourinary:  Negative for hematuria.  Musculoskeletal:        Per HPI  Skin:  Positive for rash. Negative for color change.  Neurological:  Negative for dizziness, weakness, numbness and headaches.       Muscle Spasm, Memory Loss  Hematological:  Does not bruise/bleed easily.  Psychiatric/Behavioral:  Positive for sleep disturbance. Negative for dysphoric mood. The patient is nervous/anxious.   All other systems reviewed and are negative.  Objective:  Vitals:   12/17/23 1440  BP: 122/80  Temp: 36.3 C (97.3 F)  TempSrc: Temporal  Weight: 60.3 kg (133 lb)  Height: 160 cm (5' 3)  PainSc: 0-No pain     Length of Stiffness: 30-45 minutes   GEN - Pleasant, No Apparent Distress  HEENT - normocephalic and atraumatic. Conjunctiva Clear.  Neck  - supple with no adenopathy or thyromegaly.   C spine with full range of motion. Heart - regular rate and rhythm, No murmurs/gallops/rub, Nml S1S2 Lungs - clear to auscultation in all fields. Extremities - there is no cyanosis or edema. Neurological - alert and oriented.  Spine - no paraspinal tenderness; no L spine or SI joint tenderness Skin - Plaque psoriasis of the hands and feet, elbows with improvement  MSK - The following joints were examined bilaterally: Hands, Wrists, Elbows, Shoulders, Metatarsals, Ankels, Knees and Hips; they were normal apart from what is noted.    100% Fist Formation DIP Enlargement, CMC Squaring   Both Shoulder with stiffness and pain on rotation  Left 2nd MTP mild enlargement No Dactylitis No Achilles Pain or Swelling No Tender Point ______________________________________________________________________ Labs/Imaging Reviewed in EMR   CMP with Cr 0.8, Ast 14, Alt 12  Normal CBC HgA1c 12.3 --> 6.4 ESR 20 Neg: Hep B and C; Quantiferon    L Spine Xray (11/2020): 1. No acute osseous abnormality identified and mild for age osseous lumbar spine degeneration.  2.  Aortic Atherosclerosis (ICD10-I70.0).    T Spine Xray (11/2020): 1. No acute osseous abnormality identified in the thoracic spine.  2. Widespread degenerative endplate spurring but relatively preserved thoracic disc spaces.    MRI Left Shoulder (08/2020): 1. Prominent supraspinatus and subscapularis tendinopathy with moderate infraspinatus tendinopathy. Suspected mild partial  thickness articular surface tearing of the subscapularis.  2. Prominent tendinopathy  and possibly mild partial tearing of the intra-articular segment of the long head of the biceps.  3. Mild subacromial subdeltoid bursitis and subcoracoid bursitis.  4. Synovitis in the rotator interval. This can be associated with adhesive capsulitis.  5. Moderate degenerative AC joint arthropathy.    Assessment and Plan   1. Plaque  Psoriasis with Psoriatic Arthritis: Mild to Moderate, Skin has improved -- Diagnosed with psoriatic arthritis 07/2021: synovitis, tenderness of hand joints, enthesitis (epicondylitis), left 2nd toe dactylitis  -- Failed Taltz, Clobetasol   -- Continue Tremfya (Started April) -- Continue Methotrexate 17.5 mg weekly and Folic Acid 1 mg Daily   2. Long term use of immunosuppressive medication -- Jacqueline Sparks is a biologic medication that requires drug monitoring -- Methotrexate is discussed at length including risks, benefits, adverse effects, direction on use and need for monitoring on a routine basis with office visits and labs.  Literature regarding methotrexate was provided to the patient.  Patient directed to call office if any adverse effect develops or if more questions arise.  In addition, issues addressed included mouth sores, skin rashes, gastrointestinal side effects, fever, pulmonary side effects, hair loss, and abnormal lab findings.  Discussed the use of folic acid with methotrexate. -- Reviewed Labs   Diagnoses and all orders for this visit:  Psoriatic arthritis (CMS-HCC) - followed by Dr. [facility]) -     methotrexate (RHEUMATREX) 2.5 MG tablet; Take 7 tablets (17.5 mg total) by mouth every 7 (seven) days All on the same day  Psoriasis -     methotrexate (RHEUMATREX) 2.5 MG tablet; Take 7 tablets (17.5 mg total) by mouth every 7 (seven) days All on the same day  Long-term use of immunosuppressant medication  Other orders -     guselkumab (TREMFYA) 100 mg/mL Syrg; Inject 100 mg subcutaneously every 8 (eight) weeks -     clobetasoL  (TEMOVATE ) 0.05 % ointment; Apply topically 2 (two) times daily   Return in about 3 months (around 03/15/2024) for Routine Follow Up.   All new prescription medications, changes in current prescription dosages, and sample medications were discussed with the patient, including patient education, medication name, use, dosage, potential side  effects, drug interactions, consequences of not using/taking, and special instructions.  Patient expressed understanding.  No barriers to adherence.   I appreciate the opportunity to participate in the care of Jacqueline Sparks. Please do not hesitate to contact me with any questions or concerns that may arise in regards to the patient's rheumatologic disease.    Attestation Statement:   I personally performed the service. (TP)  MAYUR LOREE TOBIE, MD

## 2023-12-23 ENCOUNTER — Encounter: Payer: Self-pay | Admitting: Obstetrics and Gynecology

## 2023-12-23 ENCOUNTER — Other Ambulatory Visit (HOSPITAL_COMMUNITY)
Admission: RE | Admit: 2023-12-23 | Discharge: 2023-12-23 | Disposition: A | Discharge status: Home / Self Care | Source: Ambulatory Visit | Attending: Obstetrics | Admitting: Obstetrics

## 2023-12-23 ENCOUNTER — Ambulatory Visit (INDEPENDENT_AMBULATORY_CARE_PROVIDER_SITE_OTHER): Payer: Federal, State, Local not specified - PPO | Admitting: Obstetrics and Gynecology

## 2023-12-23 VITALS — BP 111/66 | HR 71 | Ht 63.0 in | Wt 137.0 lb

## 2023-12-23 DIAGNOSIS — Z1151 Encounter for screening for human papillomavirus (HPV): Secondary | ICD-10-CM | POA: Diagnosis not present

## 2023-12-23 DIAGNOSIS — Z01419 Encounter for gynecological examination (general) (routine) without abnormal findings: Secondary | ICD-10-CM

## 2023-12-23 DIAGNOSIS — Z7689 Persons encountering health services in other specified circumstances: Secondary | ICD-10-CM

## 2023-12-23 DIAGNOSIS — R87610 Atypical squamous cells of undetermined significance on cytologic smear of cervix (ASC-US): Secondary | ICD-10-CM | POA: Insufficient documentation

## 2023-12-23 DIAGNOSIS — R87810 Cervical high risk human papillomavirus (HPV) DNA test positive: Secondary | ICD-10-CM | POA: Diagnosis not present

## 2023-12-23 DIAGNOSIS — Z124 Encounter for screening for malignant neoplasm of cervix: Secondary | ICD-10-CM

## 2023-12-23 DIAGNOSIS — Z1231 Encounter for screening mammogram for malignant neoplasm of breast: Secondary | ICD-10-CM

## 2023-12-23 DIAGNOSIS — Z1211 Encounter for screening for malignant neoplasm of colon: Secondary | ICD-10-CM

## 2023-12-23 DIAGNOSIS — Z01411 Encounter for gynecological examination (general) (routine) with abnormal findings: Secondary | ICD-10-CM | POA: Diagnosis not present

## 2023-12-23 NOTE — Progress Notes (Signed)
 Patients presents for annual exam today. Due for pap smear, mammogram and colonoscopy, ordered. Annual labs are up to date. She states no other questions or concerns at this time.

## 2023-12-23 NOTE — Progress Notes (Signed)
 HPI:      [name] is a 63 y.o. [id number] who LMP was No LMP recorded. Patient is postmenopausal.  Subjective:   She presents today for her annual examination.  She has no complaints.  She is not having any vaginal bleeding. She had an abnormal Pap smear several years ago which showed positive HPV.  Her subsequent cervical biopsies were negative for dysplasia. She was scheduled to have a colonoscopy 1 year ago but she was not feeling well and developed COVID so it was canceled.  She has not rescheduled.    Hx: The following portions of the patient's history were reviewed and updated as appropriate:             She  has a past medical history of Anxiety, Bronchitis, Diabetes mellitus without complication (HCC), GERD (gastroesophageal reflux disease), Hyperlipidemia, and Sleep apnea. She does not have any pertinent problems on file. She  has a past surgical history that includes Dilation and curettage of uterus (10/20/2006); Rotator cuff repair (Right, 3004 and 2010); Carpal tunnel release (10/21/1999); Tubal ligation (10/20/1997); Colonoscopy with propofol (N/A, 02/17/2017); Breast excisional biopsy (Right); and Breast biopsy (Left, 2016). Her family history includes Diabetes in her father and mother; Hyperlipidemia in her father and mother; Hypertension in her father and mother. She  reports that she has been smoking cigarettes. She has a 30 pack-year smoking history. She has never used smokeless tobacco. She reports current alcohol use. She reports that she does not use drugs. She has a current medication list which includes the following prescription(s): alprazolam, atorvastatin, clobetasol ointment, NON FORMULARY, one touch ultra test, glipizide, januvia, triamcinolone cream, and anoro ellipta. She has no known allergies.       Review of Systems:  Review of Systems  Constitutional: Denied constitutional symptoms, night sweats, recent illness, fatigue, fever, insomnia and weight loss.   Eyes: Denied eye symptoms, eye pain, photophobia, vision change and visual disturbance.  Ears/Nose/Throat/Neck: Denied ear, nose, throat or neck symptoms, hearing loss, nasal discharge, sinus congestion and sore throat.  Cardiovascular: Denied cardiovascular symptoms, arrhythmia, chest pain/pressure, edema, exercise intolerance, orthopnea and palpitations.  Respiratory: Denied pulmonary symptoms, asthma, pleuritic pain, productive sputum, cough, dyspnea and wheezing.  Gastrointestinal: Denied, gastro-esophageal reflux, melena, nausea and vomiting.  Genitourinary: Denied genitourinary symptoms including symptomatic vaginal discharge, pelvic relaxation issues, and urinary complaints.  Musculoskeletal: Denied musculoskeletal symptoms, stiffness, swelling, muscle weakness and myalgia.  Dermatologic: Denied dermatology symptoms, rash and scar.  Neurologic: Denied neurology symptoms, dizziness, headache, neck pain and syncope.  Psychiatric: Denied psychiatric symptoms, anxiety and depression.  Endocrine: Denied endocrine symptoms including hot flashes and night sweats.   Meds:   Current Outpatient Medications on File Prior to Visit  Medication Sig Dispense Refill   ALPRAZolam (XANAX) 0.5 MG tablet Take 1 tablet (0.5 mg total) by mouth 2 (two) times daily as needed for anxiety. 60 tablet 5   atorvastatin (LIPITOR) 20 MG tablet Take 1 tablet (20 mg total) by mouth daily. 90 tablet 4   clobetasol ointment (TEMOVATE) 0.05 % Apply 1 application topically 2 (two) times daily. 60 g 3   NON FORMULARY CPAP     ONE TOUCH ULTRA TEST test strip USE ONE STRIP TO CHECK GLUCOSE ONCE DAILY 100 each 1   glipiZIDE (GLUCOTROL) 10 MG tablet Take 1 tablet (10 mg total) by mouth 2 (two) times daily before a meal. (Patient not taking: Reported on 12/23/2023) 180 tablet 1   JANUVIA 100 MG tablet Take 1 tablet by  mouth once daily (Patient not taking: Reported on 12/23/2023) 90 tablet 0   triamcinolone cream (KENALOG) 0.5 %  Apply 1 application topically 3 (three) times daily. (Patient not taking: Reported on 12/23/2023) 60 g 2   umeclidinium-vilanterol (ANORO ELLIPTA) 62.5-25 MCG/INH AEPB Inhale 1 puff into the lungs daily. 60 each 12   No current facility-administered medications on file prior to visit.     Objective:     Vitals:   12/23/23 1108  BP: 111/66  Pulse: 71    Filed Weights   12/23/23 1108  Weight: 137 lb (62.1 kg)              Physical examination General NAD, Conversant  HEENT Atraumatic; Op clear with mmm.  Normo-cephalic.  Anicteric sclerae  Thyroid/Neck Smooth without nodularity or enlargement. Normal ROM.  Neck Supple.  Skin No rashes, lesions or ulceration. Normal palpated skin turgor. No nodularity.  Breasts: No masses or discharge.  Symmetric.  No axillary adenopathy.  Lungs: Clear to auscultation.No rales or wheezes. Normal Respiratory effort, no retractions.  Heart: NSR.  No murmurs or rubs appreciated. No peripheral edema  Abdomen: Soft.  Non-tender.  No masses.  No HSM. No hernia  Extremities: Moves all appropriately.  Normal ROM for age. No lymphadenopathy.  Neuro: Oriented to PPT.  Normal mood. Normal affect.     Pelvic:   Vulva: Normal appearance.  No lesions.  Vagina: No lesions or abnormalities noted.  Support: Normal pelvic support.  Urethra No masses tenderness or scarring.  Meatus Normal size without lesions or prolapse.  Cervix: Normal appearance.  No lesions.  Anus: Normal exam.  No lesions.  Perineum: Normal exam.  No lesions.        Bimanual   Uterus: Normal size.  Non-tender.  Mobile.  AV.  Adnexae: No masses.  Non-tender to palpation.  Cul-de-sac: Negative for abnormality.     Assessment:    [id number] Patient Active Problem List   Diagnosis Date Noted   Psoriasis 06/01/2017   Polyp of sigmoid colon    Benign neoplasm of descending colon    Impingement syndrome of left shoulder 10/06/2016   Impingement syndrome of right shoulder 10/06/2016   BP  (high blood pressure) 11/12/2015   Abnormal abdominal MRI 11/09/2015   Abnormal ECG 11/09/2015   Anxiety 11/09/2015   Obesity 01/30/2014   Smoking 01/30/2014   Avitaminosis D 05/03/2010   Obstructive apnea 06/09/2007   Hypercholesteremia 06/09/2007   Compulsive tobacco user syndrome 06/09/2007   Renal tubular disorder 05/19/2007   Diabetes mellitus, type 2 (HCC) 06/04/2006     1. Establishing care with new doctor, encounter for   2. Well woman exam with routine gynecological exam   3. Cervical cancer screening   4. Screening mammogram for breast cancer   5. Screen for colon cancer     Normal exam  History of positive HPV   Plan:            1.  Basic Screening Recommendations The basic screening recommendations for asymptomatic women were discussed with the patient during her visit.  The age-appropriate recommendations were discussed with her and the rational for the tests reviewed.  When I am informed by the patient that another primary care physician has previously obtained the age-appropriate tests and they are up-to-date, only outstanding tests are ordered and referrals given as necessary.  Abnormal results of tests will be discussed with her when all of her results are completed.  Routine preventative health maintenance measures emphasized:  Exercise/Diet/Weight control, Tobacco Warnings, Alcohol/Substance use risks and Stress Management Pap performed-mammogram ordered-patient encouraged to follow-up with colonoscopy.  Orders Orders Placed This Encounter  Procedures   MM DIGITAL SCREENING BILATERAL   Ambulatory referral to Gastroenterology    No orders of the defined types were placed in this encounter.        F/U  Return in about 1 year (around 12/22/2024) for Annual Physical.  Elonda Husky, M.D. 12/23/2023 11:21 AM

## 2023-12-23 NOTE — Progress Notes (Deleted)
 GYNECOLOGY: ANNUAL EXAM   Subjective:    PCP: Marisue Ivan, MD Kerrilynn Derenzo is a 63 y.o. female [phone] who presents for annual wellness visit.   Well Woman Visit:  GYN HISTORY:  No LMP recorded. Patient is postmenopausal.     Menstrual History: OB History     Gravida  3   Para  2   Term  2   Preterm      AB  1   Living  2      SAB  1   IAB      Ectopic      Multiple      Live Births              Menarche age: *** No LMP recorded. Patient is postmenopausal.     Periods are every *** days, and last *** days, flow is light / moderate / heavy.  Uses pads / tampons / menstrual cup and changes it every *** hours.  Cramping is mild / moderate / severe.  Cyclic symptoms include: {symptoms; gyn cyclic:13153}.  Intermenstrual bleeding, spotting, or discharge? *** Urinary incontinence? ***  Sexually active: *** Number of sexual partners: *** Gender of sexual Partners: *** Social History   Substance and Sexual Activity  Sexual Activity Never   Contraceptive methods: {PLAN CONTRACEPTION:313102} Dyspareunia? *** STI history: *** STI/HIV testing or immunizations needed? {yes [id number]   Health Maintenance: -Last pap: was normal 03/03/19 nilm hpv neg --> Any abnormals: *** -Last mammogram: {mammo results:48038} --> Any abnormals? *** -Last colon cancer screen: *** / Type: *** -Last DEXA scan: *** [facility] of Breast / Colon / Cervical cancer: *** -Vaccines:  Immunization History  Administered Date(s) Administered   Influenza,inj,Quad PF,6+ Mos 08/08/2019   Moderna Sars-Covid-2 Vaccination 11/25/2019, 12/23/2019   Pneumococcal Polysaccharide-23 09/12/2019   Last Tdap: *** / Flu: *** / COVID: *** / Gardasil: *** / Shingles (50+): *** / PCV20:  -Hep C screen: *** -Last lipid / glucose screening: ***  > Exercise: {misc; exercise types:16438}, {exercise level:31265} > Dietary Supplements: Folate: {yes/no:20286};  Calcium: {yes/no:20286}};  Vitamin D: {yes/no:20286} > There is no height or weight on file to calculate BMI.  > Recent dental visit {yes no:314532} > Seat Belt Use: {yes no:314532} > Texting and driving? {yes no:314532} > Guns in the house {yes no:314532} > Recreational or other drug use: {Drug Use:32241}   Social History   Tobacco Use   Smoking status: Every Day    Current packs/day: 1.00    Average packs/day: 1 pack/day for 30.0 years (30.0 ttl pk-yrs)    Types: Cigarettes   Smokeless tobacco: Never  Substance Use Topics   Alcohol use: Yes    Comment: seldom use   Occupation: ***    Lives with: ***    PHQ-2 Score: In last two weeks, how often have you felt: Little interest or pleasure in doing things: {PHQGADfrequency:29690::"Not at all (0)"} Feeling down, depressed or hopeless: {PHQGADfrequency:29690::"Not at all (0)"} Score:   GAD-2 Over the last 2 weeks, how often have you been bothered by the following problems? Feeling nervous, anxious or on edge: {PHQGADfrequency:29690::"Not at all (0)"} Not being able to stop or control worrying: {PHQGADfrequency:29690::"Not at all (0)"}} Score: _________________________________________________________  Current Outpatient Medications  Medication Sig Dispense Refill   ALPRAZolam (XANAX) 0.5 MG tablet Take 1 tablet (0.5 mg total) by mouth 2 (two) times daily as needed for anxiety. 60 tablet 5   atorvastatin (LIPITOR) 20 MG tablet Take 1 tablet (20 mg  total) by mouth daily. 90 tablet 4   clobetasol ointment (TEMOVATE) 0.05 % Apply 1 application topically 2 (two) times daily. 60 g 3   Dulaglutide (TRULICITY) 1.5 MG/0.5ML SOPN Inject 1.5 mg into the skin once a week. 6 mL 1   glipiZIDE (GLUCOTROL) 10 MG tablet Take 1 tablet (10 mg total) by mouth 2 (two) times daily before a meal. (Patient taking differently: Take 10 mg by mouth daily before breakfast.) 180 tablet 1   JANUVIA 100 MG tablet Take 1 tablet by mouth once daily (Patient not taking: Reported on  04/09/2021) 90 tablet 0   mometasone (ELOCON) 0.1 % cream Apply 1 application topically daily. 45 g 1   nicotine (NICODERM CQ - DOSED IN MG/24 HOURS) 21 mg/24hr patch Place 1 patch (21 mg total) onto the skin daily. 360 patch 0   nicotine polacrilex (NICOTINE MINI) 4 MG lozenge Take 1 lozenge (4 mg total) by mouth as needed for smoking cessation. 100 tablet 0   NON FORMULARY CPAP     ONE TOUCH ULTRA TEST test strip USE ONE STRIP TO CHECK GLUCOSE ONCE DAILY 100 each 1   pioglitazone (ACTOS) 45 MG tablet Take 1 tablet by mouth once daily 90 tablet 0   triamcinolone cream (KENALOG) 0.5 % Apply 1 application topically 3 (three) times daily. 60 g 2   umeclidinium-vilanterol (ANORO ELLIPTA) 62.5-25 MCG/INH AEPB Inhale 1 puff into the lungs daily. 60 each 12   No current facility-administered medications for this visit.   No Known Allergies  Past Medical History:  Diagnosis Date   Anxiety    Bronchitis    Diabetes mellitus without complication (HCC)    GERD (gastroesophageal reflux disease)    Hyperlipidemia    Sleep apnea    Past Surgical History:  Procedure Laterality Date   BREAST BIOPSY Left 2016   2 areas, ribbon and coil clips, benign   BREAST EXCISIONAL BIOPSY Right    age 22, benign, unable to see scar   CARPAL TUNNEL RELEASE  10/21/1999   as staated this was the left side, right side was completed in 2003   COLONOSCOPY WITH PROPOFOL N/A 02/17/2017   Procedure: COLONOSCOPY WITH PROPOFOL;  Surgeon: Midge Minium, MD;  Location: [facility] ENDOSCOPY;  Service: Endoscopy;  Laterality: N/A;   DILATION AND CURETTAGE OF UTERUS  10/20/2006   as stated uterine polyps   ROTATOR CUFF REPAIR Right 3004 and 2010   TUBAL LIGATION  10/20/1997    Review Of Systems  Constitutional: Denied constitutional symptoms, night sweats, recent illness, fatigue, fever, insomnia and weight loss.  Eyes: Denied eye symptoms, eye pain, photophobia, vision change and visual disturbance.  Ears/Nose/Throat/Neck:  Denied ear, nose, throat or neck symptoms, hearing loss, nasal discharge, sinus congestion and sore throat.  Cardiovascular: Denied cardiovascular symptoms, arrhythmia, chest pain/pressure, edema, exercise intolerance, orthopnea and palpitations.  Respiratory: Denied pulmonary symptoms, asthma, pleuritic pain, productive sputum, cough, dyspnea and wheezing.  Gastrointestinal: Denied, gastro-esophageal reflux, melena, nausea and vomiting.  Genitourinary:*** Denied genitourinary symptoms including symptomatic vaginal discharge, pelvic relaxation issues, and urinary complaints.  Musculoskeletal: Denied musculoskeletal symptoms, stiffness, swelling, muscle weakness and myalgia.  Dermatologic: Denied dermatology symptoms, rash and scar.  Neurologic: Denied neurology symptoms, dizziness, headache, neck pain and syncope.  Psychiatric: Denied psychiatric symptoms, anxiety and depression.  Endocrine: Denied endocrine symptoms including hot flashes and night sweats.      Objective:    There were no vitals taken for this visit.  Constitutional: Well-developed, well-nourished female in no acute  distress Neurological: Alert and oriented to person, place, and time Psychiatric: Mood and affect appropriate Skin: No rashes or lesions Neck: Supple without masses. Trachea is midline.Thyroid is normal size without masses Lymphatics: No cervical, axillary, supraclavicular, or inguinal adenopathy noted Respiratory: Clear to auscultation bilaterally. Good air movement with normal work of breathing. Cardiovascular: Regular rate and rhythm. Extremities grossly normal, nontender with no edema; pulses regular Gastrointestinal: Soft, nontender, nondistended. No masses or hernias appreciated. No hepatosplenomegaly. No fluid wave. No rebound or guarding. Breast Exam: {Exam; breast:13139::"normal appearance, no masses or tenderness"} Genitourinary:         External Genitalia: Normal female genitalia    Vagina: Normal  mucosa, no lesions.    Cervix: No lesions, normal size and consistency; no cervical motion tenderness; non-friable; Pap not***obtained.    Uterus: Normal size and contour; smooth, mobile, NT, {Desc; anteverted/retroverted/midposition:60613}. Adnexae: Non-palpable and non-tender Perineum/Anus: No lesions Rectal: deferred    Assessment/Plan:    Sherrin Stahle is a 63 y.o. female [phone] with normal well-woman gynecologic exam.  -Screenings:  Pap: done with cotesting today  *** w/rflx today Mammogram: ordered***due *** Colon: ordered colonoscopy***Cologuard -OR- due *** Labs: ***A1C, CMP, HepC, Lipid panel, Vit D, TSH GAD***PHQ-2 = ***, discussed coping techniques; follow up with PCP if worsens or develops concern -Contraception: *** -Vaccines: UTD, Tdap today; pt declines Influenza. Gardasil: series not started, declined today.  -Healthy lifestyle modifications discussed: multivitamin, diet, exercise, sunscreen, tobacco and alcohol use. Emphasized importance of regular physical activity.  -Folate***Calcium and Vit D recommendation reviewed.  -All questions answered to patient's satisfaction.  -RTC 1 yr for annual, sooner prn.   No follow-ups on file.    Julieanne Manson, DO [facility]

## 2023-12-24 ENCOUNTER — Telehealth: Payer: Self-pay

## 2023-12-24 ENCOUNTER — Other Ambulatory Visit: Payer: Self-pay

## 2023-12-24 DIAGNOSIS — Z860100 Personal history of colon polyps, unspecified: Secondary | ICD-10-CM

## 2023-12-24 MED ORDER — NA SULFATE-K SULFATE-MG SULF 17.5-3.13-1.6 GM/177ML PO SOLN: 1.0000 | Freq: Once | ORAL | 0 refills | Status: AC

## 2023-12-24 NOTE — Telephone Encounter (Signed)
 Gastroenterology Pre-Procedure Review  Request Date: 01/28/24 Requesting Physician: Dr. Servando Snare  PATIENT REVIEW QUESTIONS: The patient responded to the following health history questions as indicated:    1. Are you having any GI issues? no 2. Do you have a personal history of Polyps? yes (last colonoscopy 02/17/2017 Dr. Servando Snare recommended repeat 5 years) 3. Do you have a family history of Colon Cancer or Polyps? no 4. Diabetes Mellitus? yes (Takes Mounjaro.  Has been advised to stop 7 days prior to colonoscopy.  Contact PCP in advance to be advised how to monitor blood sugars as this med is stopped.  Report any blood sugar fluctuations and concerns to  PCP) 5. Joint replacements in the past 12 months?no 6. Major health problems in the past 3 months?no 7. Any artificial heart valves, MVP, or defibrillator?no    MEDICATIONS & ALLERGIES:    Patient reports the following regarding taking any anticoagulation/antiplatelet therapy:   Plavix, Coumadin, Eliquis, Xarelto, Lovenox, Pradaxa, Brilinta, or Effient? no Aspirin? no  Patient confirms/reports the following medications:  Current Outpatient Medications  Medication Sig Dispense Refill   albuterol (VENTOLIN HFA) 108 (90 Base) MCG/ACT inhaler SMARTSIG:2 Puff(s) By Mouth Every 4 Hours PRN     ALPRAZolam (XANAX) 0.5 MG tablet Take 1 tablet (0.5 mg total) by mouth 2 (two) times daily as needed for anxiety. 60 tablet 5   atorvastatin (LIPITOR) 20 MG tablet Take 1 tablet (20 mg total) by mouth daily. 90 tablet 4   buPROPion (WELLBUTRIN SR) 150 MG 12 hr tablet Take 1 tablet by mouth 2 (two) times daily.     clobetasol ointment (TEMOVATE) 0.05 % Apply 1 application topically 2 (two) times daily. 60 g 3   Continuous Glucose Sensor (DEXCOM G7 SENSOR) MISC Use 1 each every 10 (ten) days     guselkumab (TREMFYA) 100 MG/ML prefilled syringe Inject into the skin.     ibuprofen (ADVIL) 600 MG tablet Take 600 mg by mouth every 6 (six) hours as needed.      lisinopril (ZESTRIL) 2.5 MG tablet Take 1 tablet by mouth daily.     MOUNJARO 5 MG/0.5ML Pen      triamcinolone cream (KENALOG) 0.5 % Apply 1 application topically 3 (three) times daily. 60 g 2   umeclidinium-vilanterol (ANORO ELLIPTA) 62.5-25 MCG/INH AEPB Inhale 1 puff into the lungs daily. 60 each 12   glipiZIDE (GLUCOTROL) 10 MG tablet Take 1 tablet (10 mg total) by mouth 2 (two) times daily before a meal. (Patient not taking: Reported on 12/24/2023) 180 tablet 1   JANUVIA 100 MG tablet Take 1 tablet by mouth once daily (Patient not taking: Reported on 04/09/2021) 90 tablet 0   NON FORMULARY CPAP     ONE TOUCH ULTRA TEST test strip USE ONE STRIP TO CHECK GLUCOSE ONCE DAILY 100 each 1   No current facility-administered medications for this visit.    Patient confirms/reports the following allergies:  No Known Allergies  No orders of the defined types were placed in this encounter.   AUTHORIZATION INFORMATION Primary Insurance: 1D#: Group #:  Secondary Insurance: 1D#: Group #:  SCHEDULE INFORMATION: Date: 01/28/24 Time: Location: [facility]

## 2023-12-28 LAB — CYTOLOGY - PAP
Comment: NEGATIVE
Diagnosis: UNDETERMINED — AB
High risk HPV: POSITIVE — AB

## 2024-01-05 ENCOUNTER — Ambulatory Visit (INDEPENDENT_AMBULATORY_CARE_PROVIDER_SITE_OTHER): Admitting: Obstetrics and Gynecology

## 2024-01-05 ENCOUNTER — Other Ambulatory Visit (HOSPITAL_COMMUNITY)
Admission: RE | Admit: 2024-01-05 | Discharge: 2024-01-05 | Disposition: A | Discharge status: Home / Self Care | Source: Ambulatory Visit | Attending: Obstetrics and Gynecology | Admitting: Obstetrics and Gynecology

## 2024-01-05 ENCOUNTER — Encounter: Payer: Self-pay | Admitting: Obstetrics and Gynecology

## 2024-01-05 VITALS — BP 124/74 | HR 77 | Ht 63.0 in | Wt 134.4 lb

## 2024-01-05 DIAGNOSIS — R87610 Atypical squamous cells of undetermined significance on cytologic smear of cervix (ASC-US): Secondary | ICD-10-CM | POA: Insufficient documentation

## 2024-01-05 DIAGNOSIS — N870 Mild cervical dysplasia: Secondary | ICD-10-CM | POA: Diagnosis not present

## 2024-01-05 DIAGNOSIS — R87810 Cervical high risk human papillomavirus (HPV) DNA test positive: Secondary | ICD-10-CM | POA: Insufficient documentation

## 2024-01-05 NOTE — Progress Notes (Signed)
 HPI:  Jacqueline Sparks is a 63 y.o.  [id number]  who presents today for evaluation and management of abnormal cervical cytology.    Dysplasia History: ASCUS     HPV: Positive Patient has a remote history of positive HPV.  A colposcopy several years ago showed no dysplasia.  She states that her HPV resolved but has now reoccurred.  ROS:  Pertinent items noted in HPI and remainder of comprehensive ROS otherwise negative.  OB History  Gravida Para Term Preterm AB Living  3 2 2  1 2   SAB IAB Ectopic Multiple Live Births  1        # Outcome Date GA Lbr Len/2nd Weight Sex Type Anes PTL Lv  3 Term 01/19/98   8 lb (3.629 kg) M Vag-Spont     2 Term 09/26/88   6 lb (2.722 kg) M Vag-Spont     1 SAB             Past Medical History:  Diagnosis Date   Anxiety    Bronchitis    Diabetes mellitus without complication (HCC)    GERD (gastroesophageal reflux disease)    Hyperlipidemia    Sleep apnea     Past Surgical History:  Procedure Laterality Date   BREAST BIOPSY Left 2016   2 areas, ribbon and coil clips, benign   BREAST EXCISIONAL BIOPSY Right    age 89, benign, unable to see scar   CARPAL TUNNEL RELEASE  10/21/1999   as staated this was the left side, right side was completed in 2003   COLONOSCOPY WITH PROPOFOL N/A 02/17/2017   Procedure: COLONOSCOPY WITH PROPOFOL;  Surgeon: Midge Minium, MD;  Location: [facility] ENDOSCOPY;  Service: Endoscopy;  Laterality: N/A;   DILATION AND CURETTAGE OF UTERUS  10/20/2006   as stated uterine polyps   ROTATOR CUFF REPAIR Right 3004 and 2010   TUBAL LIGATION  10/20/1997    SOCIAL HISTORY:  Social History   Substance and Sexual Activity  Alcohol Use Yes   Comment: seldom use    Social History   Substance and Sexual Activity  Drug Use No     Family History  Problem Relation Age of Onset   Hyperlipidemia Mother    Hypertension Mother    Diabetes Mother    Diabetes Father    Hyperlipidemia Father    Hypertension Father     ALLERGIES:   Patient has no known allergies.  She has a current medication list which includes the following prescription(s): albuterol, alprazolam, atorvastatin, bupropion, clobetasol ointment, dexcom g7 sensor, glipizide, tremfya, ibuprofen, januvia, lisinopril, mounjaro, NON FORMULARY, one touch ultra test, triamcinolone cream, and anoro ellipta.  Physical Exam: -Vitals:  BP 124/74   Pulse 77   Ht 5\' 3"  (1.6 m)   Wt 134 lb 6.4 oz (61 kg)   BMI 23.81 kg/m   PROCEDURE: Colposcopy performed with 4% acetic acid after verbal consent obtained                           -Aceto-white Lesions Location(s): See above - NONE NOTED                            -ECC indicated and performed: Yes.       -Biopsy sites made hemostatic with pressure and Monsel's solution   -Satisfactory colposcopy: No.    -Evidence of Invasive cervical CA :  NO  ASSESSMENT:  Jacqueline Sparks is a 63 y.o. [id number] here for  1. ASCUS with positive high risk HPV cervical   .  PLAN: 1.  I discussed the grading system of pap smears and HPV high risk viral types.  We will discuss management after colpo results return.  No orders of the defined types were placed in this encounter.          F/U  Return in about 2 weeks (around 01/19/2024) for Colpo f/u.  Brennan Bailey ,MD 01/05/2024,3:15 PM

## 2024-01-05 NOTE — Progress Notes (Signed)
 Patient presents today for a colposcopy. She recently had an abnormal pap smear resulting in ascus/hpv+ . No further concerns today.

## 2024-01-07 LAB — SURGICAL PATHOLOGY

## 2024-01-11 NOTE — Telephone Encounter (Signed)
-----   Message from [facility] [place] sent at 01/11/2024  3:12 PM EDT ----- Regarding: colpo f/u Pt needs colpo f/u, can be virtual

## 2024-01-11 NOTE — Telephone Encounter (Signed)
 Contacted the patient via phone, left message for the patient to call back to be scheduled.

## 2024-01-12 ENCOUNTER — Encounter: Payer: Self-pay | Admitting: Obstetrics and Gynecology

## 2024-01-12 ENCOUNTER — Ambulatory Visit (INDEPENDENT_AMBULATORY_CARE_PROVIDER_SITE_OTHER): Admitting: Obstetrics and Gynecology

## 2024-01-12 VITALS — BP 125/76 | HR 71 | Ht 63.0 in | Wt 133.4 lb

## 2024-01-12 DIAGNOSIS — N870 Mild cervical dysplasia: Secondary | ICD-10-CM

## 2024-01-12 NOTE — Progress Notes (Signed)
 HPI:      [name] is a 63 y.o. [id number] who LMP was No LMP recorded. Patient is postmenopausal.  Subjective:   She presents today to discuss her colposcopically directed biopsy findings. She initially had ASCUS with positive HPV.  She presented for and underwent a colposcopy.  No exocervical change was noted.  An ECC was performed.    Hx: The following portions of the patient's history were reviewed and updated as appropriate:             She  has a past medical history of Anxiety, Bronchitis, Diabetes mellitus without complication (HCC), GERD (gastroesophageal reflux disease), Hyperlipidemia, and Sleep apnea. She does not have any pertinent problems on file. She  has a past surgical history that includes Dilation and curettage of uterus (10/20/2006); Rotator cuff repair (Right, 3004 and 2010); Carpal tunnel release (10/21/1999); Tubal ligation (10/20/1997); Colonoscopy with propofol (N/A, 02/17/2017); Breast excisional biopsy (Right); Breast biopsy (Left, 2016); and Colposcopy w/ biopsy / curettage. Her family history includes Diabetes in her father and mother; Hyperlipidemia in her father and mother; Hypertension in her father and mother. She  reports that she has been smoking cigarettes. She has a 30 pack-year smoking history. She has never used smokeless tobacco. She reports current alcohol use. She reports that she does not use drugs. She has a current medication list which includes the following prescription(s): albuterol, alprazolam, atorvastatin, bupropion, clobetasol ointment, dexcom g7 sensor, glipizide, tremfya, ibuprofen, januvia, lisinopril, mounjaro, NON FORMULARY, one touch ultra test, triamcinolone cream, and anoro ellipta. She has no known allergies.       Review of Systems:  Review of Systems  Constitutional: Denied constitutional symptoms, night sweats, recent illness, fatigue, fever, insomnia and weight loss.  Eyes: Denied eye symptoms, eye pain, photophobia, vision  change and visual disturbance.  Ears/Nose/Throat/Neck: Denied ear, nose, throat or neck symptoms, hearing loss, nasal discharge, sinus congestion and sore throat.  Cardiovascular: Denied cardiovascular symptoms, arrhythmia, chest pain/pressure, edema, exercise intolerance, orthopnea and palpitations.  Respiratory: Denied pulmonary symptoms, asthma, pleuritic pain, productive sputum, cough, dyspnea and wheezing.  Gastrointestinal: Denied, gastro-esophageal reflux, melena, nausea and vomiting.  Genitourinary: Denied genitourinary symptoms including symptomatic vaginal discharge, pelvic relaxation issues, and urinary complaints.  Musculoskeletal: Denied musculoskeletal symptoms, stiffness, swelling, muscle weakness and myalgia.  Dermatologic: Denied dermatology symptoms, rash and scar.  Neurologic: Denied neurology symptoms, dizziness, headache, neck pain and syncope.  Psychiatric: Denied psychiatric symptoms, anxiety and depression.  Endocrine: Denied endocrine symptoms including hot flashes and night sweats.   Meds:   Current Outpatient Medications on File Prior to Visit  Medication Sig Dispense Refill   albuterol (VENTOLIN HFA) 108 (90 Base) MCG/ACT inhaler SMARTSIG:2 Puff(s) By Mouth Every 4 Hours PRN     ALPRAZolam (XANAX) 0.5 MG tablet Take 1 tablet (0.5 mg total) by mouth 2 (two) times daily as needed for anxiety. 60 tablet 5   atorvastatin (LIPITOR) 20 MG tablet Take 1 tablet (20 mg total) by mouth daily. 90 tablet 4   buPROPion (WELLBUTRIN SR) 150 MG 12 hr tablet Take 1 tablet by mouth 2 (two) times daily.     clobetasol ointment (TEMOVATE) 0.05 % Apply 1 application topically 2 (two) times daily. 60 g 3   Continuous Glucose Sensor (DEXCOM G7 SENSOR) MISC Use 1 each every 10 (ten) days     glipiZIDE (GLUCOTROL) 10 MG tablet Take 1 tablet (10 mg total) by mouth 2 (two) times daily before a meal. 180 tablet 1   guselkumab (  TREMFYA) 100 MG/ML prefilled syringe Inject into the skin.      ibuprofen (ADVIL) 600 MG tablet Take 600 mg by mouth every 6 (six) hours as needed.     JANUVIA 100 MG tablet Take 1 tablet by mouth once daily 90 tablet 0   lisinopril (ZESTRIL) 2.5 MG tablet Take 1 tablet by mouth daily.     MOUNJARO 5 MG/0.5ML Pen      NON FORMULARY CPAP     ONE TOUCH ULTRA TEST test strip USE ONE STRIP TO CHECK GLUCOSE ONCE DAILY 100 each 1   triamcinolone cream (KENALOG) 0.5 % Apply 1 application topically 3 (three) times daily. 60 g 2   umeclidinium-vilanterol (ANORO ELLIPTA) 62.5-25 MCG/INH AEPB Inhale 1 puff into the lungs daily. 60 each 12   No current facility-administered medications on file prior to visit.      Objective:     Vitals:   01/12/24 1329  BP: 125/76  Pulse: 71   Filed Weights   01/12/24 1329  Weight: 133 lb 6.4 oz (60.5 kg)                        Assessment:    [id number] Patient Active Problem List   Diagnosis Date Noted   Psoriasis 06/01/2017   Polyp of sigmoid colon    Benign neoplasm of descending colon    Impingement syndrome of left shoulder 10/06/2016   Impingement syndrome of right shoulder 10/06/2016   BP (high blood pressure) 11/12/2015   Abnormal abdominal MRI 11/09/2015   Abnormal ECG 11/09/2015   Anxiety 11/09/2015   Obesity 01/30/2014   Smoking 01/30/2014   Avitaminosis D 05/03/2010   Obstructive apnea 06/09/2007   Hypercholesteremia 06/09/2007   Compulsive tobacco user syndrome 06/09/2007   Renal tubular disorder 05/19/2007   Diabetes mellitus, type 2 (HCC) 06/04/2006     1. CIN I (cervical intraepithelial neoplasia I)     Of the ECC.  No extra cervical change was noted at colposcopy.  Her ASCUS Pap smear is entirely consistent with CIN-1   Plan:            1.  I have discussed the natural course and history of HPV and CIN and its relationship to cervical cancer.  The significance of a lesion that cannot be seen at colposcopy was discussed.  The possibility of excision of the endocervical canal was  discussed.  The option of expectant management for 1 year with follow-up Pap and especially Cytobrush and ECC was discussed.  Risks of waiting versus being treated discussed.  Patient has elected to have a Pap smear in 1 year as follow-up.   Orders No orders of the defined types were placed in this encounter.   No orders of the defined types were placed in this encounter.     F/U  Return in about 1 year (around 01/11/2025) for Annual Physical.  Elonda Husky, M.D. 01/12/2024 2:02 PM

## 2024-01-12 NOTE — Progress Notes (Signed)
 Patient presents today for a colposcopy follow-up. She states no further concerns today.

## 2024-01-28 ENCOUNTER — Ambulatory Visit
Admission: RE | Admit: 2024-01-28 | Discharge: 2024-01-28 | Disposition: A | Discharge status: Home / Self Care | Attending: Gastroenterology | Admitting: Gastroenterology

## 2024-01-28 ENCOUNTER — Ambulatory Visit: Admitting: Anesthesiology

## 2024-01-28 ENCOUNTER — Encounter
Admission: RE | Disposition: A | Discharge status: Home / Self Care | Payer: Self-pay | Source: Home / Self Care | Attending: Gastroenterology

## 2024-01-28 ENCOUNTER — Encounter: Payer: Self-pay | Admitting: Gastroenterology

## 2024-01-28 DIAGNOSIS — D120 Benign neoplasm of cecum: Secondary | ICD-10-CM | POA: Diagnosis not present

## 2024-01-28 DIAGNOSIS — E119 Type 2 diabetes mellitus without complications: Secondary | ICD-10-CM | POA: Insufficient documentation

## 2024-01-28 DIAGNOSIS — F419 Anxiety disorder, unspecified: Secondary | ICD-10-CM | POA: Insufficient documentation

## 2024-01-28 DIAGNOSIS — D123 Benign neoplasm of transverse colon: Secondary | ICD-10-CM | POA: Insufficient documentation

## 2024-01-28 DIAGNOSIS — F17210 Nicotine dependence, cigarettes, uncomplicated: Secondary | ICD-10-CM | POA: Insufficient documentation

## 2024-01-28 DIAGNOSIS — J449 Chronic obstructive pulmonary disease, unspecified: Secondary | ICD-10-CM | POA: Insufficient documentation

## 2024-01-28 DIAGNOSIS — K640 First degree hemorrhoids: Secondary | ICD-10-CM | POA: Insufficient documentation

## 2024-01-28 DIAGNOSIS — Z860100 Personal history of colon polyps, unspecified: Secondary | ICD-10-CM

## 2024-01-28 DIAGNOSIS — Z1211 Encounter for screening for malignant neoplasm of colon: Secondary | ICD-10-CM | POA: Diagnosis present

## 2024-01-28 DIAGNOSIS — Z7984 Long term (current) use of oral hypoglycemic drugs: Secondary | ICD-10-CM | POA: Diagnosis not present

## 2024-01-28 DIAGNOSIS — G4730 Sleep apnea, unspecified: Secondary | ICD-10-CM | POA: Insufficient documentation

## 2024-01-28 DIAGNOSIS — I10 Essential (primary) hypertension: Secondary | ICD-10-CM | POA: Insufficient documentation

## 2024-01-28 DIAGNOSIS — D124 Benign neoplasm of descending colon: Secondary | ICD-10-CM | POA: Insufficient documentation

## 2024-01-28 DIAGNOSIS — D122 Benign neoplasm of ascending colon: Secondary | ICD-10-CM | POA: Insufficient documentation

## 2024-01-28 DIAGNOSIS — K635 Polyp of colon: Secondary | ICD-10-CM

## 2024-01-28 HISTORY — PX: COLONOSCOPY: SHX5424

## 2024-01-28 HISTORY — PX: POLYPECTOMY: SHX149

## 2024-01-28 LAB — GLUCOSE, CAPILLARY
Glucose-Capillary: 67 mg/dL — ABNORMAL LOW (ref 70–99)
Glucose-Capillary: 60 mg/dL — ABNORMAL LOW (ref 70–99)
Glucose-Capillary: 100 mg/dL — ABNORMAL HIGH (ref 70–99)

## 2024-01-28 SURGERY — COLONOSCOPY
Anesthesia: General

## 2024-01-28 MED ORDER — EPHEDRINE 5 MG/ML INJ
INTRAVENOUS | Status: AC
  Filled 2024-01-28: qty 5

## 2024-01-28 MED ORDER — PROPOFOL 10 MG/ML IV BOLUS
INTRAVENOUS | Status: AC
  Filled 2024-01-28: qty 40

## 2024-01-28 MED ORDER — LIDOCAINE HCL (PF) 2 % IJ SOLN
INTRAMUSCULAR | Status: DC | PRN
Start: 2024-01-28 — End: 2024-01-28
  Administered 2024-01-28: 100 mg via INTRADERMAL

## 2024-01-28 MED ORDER — DEXTROSE 50 % IV SOLN
12.5000 g | Freq: Once | INTRAVENOUS | Status: AC
  Administered 2024-01-28: 12.5 g via INTRAVENOUS

## 2024-01-28 MED ORDER — LIDOCAINE HCL (PF) 2 % IJ SOLN
INTRAMUSCULAR | Status: AC
  Filled 2024-01-28: qty 5

## 2024-01-28 MED ORDER — EPHEDRINE SULFATE-NACL 50-0.9 MG/10ML-% IV SOSY
PREFILLED_SYRINGE | INTRAVENOUS | Status: DC | PRN
  Administered 2024-01-28: 5 mg via INTRAVENOUS

## 2024-01-28 MED ORDER — PROPOFOL 500 MG/50ML IV EMUL
INTRAVENOUS | Status: DC | PRN
  Administered 2024-01-28: 160 ug/kg/min via INTRAVENOUS
  Administered 2024-01-28: 100 mg via INTRAVENOUS

## 2024-01-28 MED ORDER — STERILE WATER FOR IRRIGATION IR SOLN
Status: DC | PRN
  Administered 2024-01-28: 120 mL

## 2024-01-28 MED ORDER — SODIUM CHLORIDE 0.9 % IV SOLN: INTRAVENOUS | Status: DC

## 2024-01-28 MED ORDER — DEXTROSE 50 % IV SOLN
INTRAVENOUS | Status: AC
  Filled 2024-01-28: qty 50

## 2024-01-28 NOTE — Anesthesia Postprocedure Evaluation (Signed)
 Anesthesia Post Note  Patient: Jacqueline Sparks  Procedure(s) Performed: COLONOSCOPY POLYPECTOMY, INTESTINE  Patient location during evaluation: PACU Anesthesia Type: General Level of consciousness: awake Pain management: satisfactory to patient Vital Signs Assessment: post-procedure vital signs reviewed and stable Respiratory status: spontaneous breathing Cardiovascular status: stable Anesthetic complications: no   No notable events documented.   Last Vitals:  Vitals:   01/28/24 1244 01/28/24 1254  BP: 132/73 (!) 153/81  Pulse: 71 88  Resp: 15 13  Temp:    SpO2: 100% 100%    Last Pain:  Vitals:   01/28/24 1254  TempSrc:   PainSc: 0-No pain                 VAN STAVEREN,Shirle Provencal

## 2024-01-28 NOTE — H&P (Signed)
 Midge Minium, MD [facility] [address], [address] [place], [place] [address] Phone:[phone] Fax : [phone]  Primary Care Physician:  Marisue Ivan, MD Primary Gastroenterologist:  Dr. Servando Snare  Pre-Procedure History & Physical: HPI:  Jacqueline Sparks is a 63 y.o. female is here for an colonoscopy.   Past Medical History:  Diagnosis Date   Anxiety    Bronchitis    Diabetes mellitus without complication (HCC)    GERD (gastroesophageal reflux disease)    Hyperlipidemia    Sleep apnea     Past Surgical History:  Procedure Laterality Date   BREAST BIOPSY Left 2016   2 areas, ribbon and coil clips, benign   BREAST EXCISIONAL BIOPSY Right    age 89, benign, unable to see scar   CARPAL TUNNEL RELEASE  10/21/1999   as staated this was the left side, right side was completed in 2003   COLONOSCOPY WITH PROPOFOL N/A 02/17/2017   Procedure: COLONOSCOPY WITH PROPOFOL;  Surgeon: Midge Minium, MD;  Location: [facility] ENDOSCOPY;  Service: Endoscopy;  Laterality: N/A;   COLPOSCOPY W/ BIOPSY / CURETTAGE     DILATION AND CURETTAGE OF UTERUS  10/20/2006   as stated uterine polyps   ROTATOR CUFF REPAIR Right 3004 and 2010   TUBAL LIGATION  10/20/1997    Prior to Admission medications   Medication Sig Start Date End Date Taking? Authorizing Provider  albuterol (VENTOLIN HFA) 108 (90 Base) MCG/ACT inhaler SMARTSIG:2 Puff(s) By Mouth Every 4 Hours PRN 10/21/23   [provider]  ALPRAZolam Prudy Feeler) 0.5 MG tablet Take 1 tablet (0.5 mg total) by mouth 2 (two) times daily as needed for anxiety. 12/10/20   Margaretann Loveless, PA-C  atorvastatin (LIPITOR) 20 MG tablet Take 1 tablet (20 mg total) by mouth daily. 04/09/21   Malva Limes, MD  buPROPion (WELLBUTRIN SR) 150 MG 12 hr tablet Take 1 tablet by mouth 2 (two) times daily. 05/20/23   [provider]  clobetasol ointment (TEMOVATE) 0.05 % Apply 1 application topically 2 (two) times daily. 12/10/20   Margaretann Loveless, PA-C   Continuous Glucose Sensor (DEXCOM G7 SENSOR) MISC Use 1 each every 10 (ten) days 11/03/23   [provider]  glipiZIDE (GLUCOTROL) 10 MG tablet Take 1 tablet (10 mg total) by mouth 2 (two) times daily before a meal. 12/10/20   Burnette, Alessandra Bevels, PA-C  guselkumab [facility]) 100 MG/ML prefilled syringe Inject into the skin. 12/17/23   [provider]  ibuprofen (ADVIL) 600 MG tablet Take 600 mg by mouth every 6 (six) hours as needed. 07/08/23   [provider]  JANUVIA 100 MG tablet Take 1 tablet by mouth once daily 12/09/20   Margaretann Loveless, PA-C  lisinopril (ZESTRIL) 2.5 MG tablet Take 1 tablet by mouth daily. 11/30/23   [provider]  MOUNJARO 5 MG/0.5ML Pen  10/11/23   [provider]  NON FORMULARY CPAP    [provider]  ONE TOUCH ULTRA TEST test strip USE ONE STRIP TO CHECK GLUCOSE ONCE DAILY 02/11/16   Lorie Phenix, MD  triamcinolone cream (KENALOG) 0.5 % Apply 1 application topically 3 (three) times daily. 04/09/21   Malva Limes, MD  umeclidinium-vilanterol (ANORO ELLIPTA) 62.5-25 MCG/INH AEPB Inhale 1 puff into the lungs daily. 04/09/21   Malva Limes, MD    Allergies as of 12/24/2023   (No Known Allergies)    Family History  Problem Relation Age of Onset   Hyperlipidemia Mother    Hypertension Mother  Diabetes Mother    Diabetes Father    Hyperlipidemia Father    Hypertension Father     Social History   Socioeconomic History   Marital status: Single    Spouse name: Not on file   Number of children: Not on file   Years of education: Not on file   Highest education level: Not on file  Occupational History   Not on file  Tobacco Use   Smoking status: Every Day    Current packs/day: 1.00    Average packs/day: 1 pack/day for 30.0 years (30.0 ttl pk-yrs)    Types: Cigarettes   Smokeless tobacco: Never  Vaping Use   Vaping status: Never Used  Substance and Sexual Activity   Alcohol use: Yes     Comment: seldom use   Drug use: No   Sexual activity: Not Currently    Birth control/protection: Post-menopausal  Other Topics Concern   Not on file  Social History Narrative   Not on file   Social Drivers of Health   Financial Resource Strain: Low Risk  (05/20/2023)   Received from [facility]   Overall Financial Resource Strain (CARDIA)    Difficulty of Paying Living Expenses: Not hard at all  Food Insecurity: No Food Insecurity (05/20/2023)   Received from [facility]   Hunger Vital Sign    Worried About Running Out of Food in the Last Year: Never true    Ran Out of Food in the Last Year: Never true  Transportation Needs: No Transportation Needs (05/20/2023)   Received from [facility]    In the past 12 months, has lack of transportation kept you from medical appointments or from getting medications?: No    Lack of Transportation (Non-Medical): No  Physical Activity: Not on file  Stress: Not on file  Social Connections: Not on file  Intimate Partner Violence: Not on file    Review of Systems: See HPI, otherwise negative ROS  Physical Exam: There were no vitals taken for this visit. General:   Alert,  pleasant and cooperative in NAD Head:  Normocephalic and atraumatic. Neck:  Supple; no masses or thyromegaly. Lungs:  Clear throughout to auscultation.    Heart:  Regular rate and rhythm. Abdomen:  Soft, nontender and nondistended. Normal bowel sounds, without guarding, and without rebound.   Neurologic:  Alert and  oriented x4;  grossly normal neurologically.  Impression/Plan: Jacqueline Sparks is here for an colonoscopy to be performed for a history of adenomatous polyps on 2018   Risks, benefits, limitations, and alternatives regarding  colonoscopy have been reviewed with the patient.  Questions have been answered.  All parties agreeable.   Midge Minium, MD  01/28/2024, 11:32 AM

## 2024-01-28 NOTE — Transfer of Care (Signed)
 Immediate Anesthesia Transfer of Care Note  Patient: Jacqueline Sparks  Procedure(s) Performed: COLONOSCOPY POLYPECTOMY, INTESTINE  Patient Location: Endoscopy Unit  Anesthesia Type:General  Level of Consciousness: drowsy  Airway & Oxygen Therapy: Patient Spontanous Breathing  Post-op Assessment: Report given to RN and Post -op Vital signs reviewed and stable  Post vital signs: Reviewed and stable  Last Vitals:  Vitals Value Taken Time  BP 107/66 1234  Temp 35.8 1234  Pulse 68 1234  Resp 16 1234  SpO2 98 1234    Last Pain: 0     Complications: No notable events documented.

## 2024-01-28 NOTE — Op Note (Signed)
 [facility] Patient Name: Jacqueline Sparks Procedure Date: 01/28/2024 11:53 AM MRN: [id number] Account #: [id number] Date of Birth: 1961/09/10 Admit Type: Outpatient Age: 63 Room: [facility] ENDO ROOM 4 Gender: Female Note Status: Finalized Instrument Name: Prentice Docker [id number] Procedure:             Colonoscopy Indications:           High risk colon cancer surveillance: Personal history                         of colonic polyps Providers:             Midge Minium MD, MD Referring MD:          Marisue Ivan (Referring MD) Medicines:             Propofol per Anesthesia Complications:         No immediate complications. Procedure:             Pre-Anesthesia Assessment:                        - Prior to the procedure, a History and Physical was                         performed, and patient medications and allergies were                         reviewed. The patient's tolerance of previous                         anesthesia was also reviewed. The risks and benefits                         of the procedure and the sedation options and risks                         were discussed with the patient. All questions were                         answered, and informed consent was obtained. Prior                         Anticoagulants: The patient has taken no anticoagulant                         or antiplatelet agents. ASA Grade Assessment: II - A                         patient with mild systemic disease. After reviewing                         the risks and benefits, the patient was deemed in                         satisfactory condition to undergo the procedure.                        After obtaining informed consent, the colonoscope was  passed under direct vision. Throughout the procedure,                         the patient's blood pressure, pulse, and oxygen                         saturations were monitored continuously. The                          Colonoscope was introduced through the anus and                         advanced to the the cecum, identified by appendiceal                         orifice and ileocecal valve. The colonoscopy was                         performed without difficulty. The patient tolerated                         the procedure well. The quality of the bowel                         preparation was good. Findings:      The perianal and digital rectal examinations were normal.      A 4 mm polyp was found in the cecum. The polyp was sessile. The polyp       was removed with a cold snare. Resection and retrieval were complete.      A 4 mm polyp was found in the ascending colon. The polyp was sessile.       The polyp was removed with a cold snare. Resection and retrieval were       complete.      A 4 mm polyp was found in the transverse colon. The polyp was sessile.       The polyp was removed with a cold snare. Resection and retrieval were       complete.      A 15 mm polyp was found in the transverse colon. The polyp was       semi-pedunculated. Preparations were made for mucosal resection.       Chromoscopy with indigo carmine was done. Demarcation of the lesion was       performed during the procedure to clearly identify the boundaries of the       lesion. Saline with indigo carmine was injected to raise the lesion.       Piecemeal mucosal resection using a snare was performed. Resection was       complete, and retrieval was complete. To close a defect after       polypectomy, one hemostatic clip was successfully placed (MR       conditional). Clip manufacturer: [employer]. There was no       bleeding at the end of the procedure. Area was tattooed with an       injection of 2 mL of [place] ink.      A 5 mm polyp was found in the descending colon. The polyp was sessile.       The polyp was removed with a cold snare. Resection and retrieval were  complete.      Non-bleeding  internal hemorrhoids were found during retroflexion. The       hemorrhoids were Grade I (internal hemorrhoids that do not prolapse). Impression:            - One 4 mm polyp in the cecum, removed with a cold                         snare. Resected and retrieved.                        - One 4 mm polyp in the ascending colon, removed with                         a cold snare. Resected and retrieved.                        - One 4 mm polyp in the transverse colon, removed with                         a cold snare. Resected and retrieved.                        - One 15 mm polyp in the transverse colon, removed                         with mucosal resection. Resected and retrieved. Clip                         (MR conditional) was placed. Clip manufacturer: [employer]. Tattooed.                        - One 5 mm polyp in the descending colon, removed with                         a cold snare. Resected and retrieved.                        - Non-bleeding internal hemorrhoids.                        - Mucosal resection was performed. Resection was                         complete, and retrieval was complete. Recommendation:        - Discharge patient to home.                        - Resume previous diet.                        - Continue present medications.                        - Await pathology results.                        -  Repeat colonoscopy in 6 months for surveillance. Procedure Code(s):     --- Professional ---                        [phone], Colonoscopy, flexible; with endoscopic mucosal                         resection                        45385, 59, Colonoscopy, flexible; with removal of                         tumor(s), polyp(s), or other lesion(s) by snare                         technique Diagnosis Code(s):     --- Professional ---                        Z86.010, Personal history of colonic polyps                        D12.4, Benign neoplasm  of descending colon CPT copyright 2022 American Medical Association. All rights reserved. The codes documented in this report are preliminary and upon coder review may  be revised to meet current compliance requirements. Midge Minium MD, MD 01/28/2024 12:31:19 PM This report has been signed electronically. Number of Addenda: 0 Note Initiated On: 01/28/2024 11:53 AM Scope Withdrawal Time: 0 hours 20 minutes 38 seconds  Total Procedure Duration: 0 hours 23 minutes 34 seconds  Estimated Blood Loss:  Estimated blood loss: none.      [facility]

## 2024-01-28 NOTE — Anesthesia Preprocedure Evaluation (Signed)
 Anesthesia Evaluation  Patient identified by MRN, date of birth, ID band Patient awake    Reviewed: Allergy & Precautions, NPO status , Patient's Chart, lab work & pertinent test results  Airway Mallampati: II  TM Distance: >3 FB Neck ROM: full    Dental  (+) Upper Dentures   Pulmonary neg pulmonary ROS, sleep apnea and Continuous Positive Airway Pressure Ventilation , COPD, Current Smoker   Pulmonary exam normal  + decreased breath sounds      Cardiovascular Exercise Tolerance: Good hypertension, Pt. on medications negative cardio ROS Normal cardiovascular exam Rhythm:Regular Rate:Normal     Neuro/Psych   Anxiety     negative neurological ROS  negative psych ROS   GI/Hepatic negative GI ROS, Neg liver ROS,GERD  ,,  Endo/Other  negative endocrine ROSdiabetes, Type 2, Oral Hypoglycemic Agents    Renal/GU negative Renal ROS  negative genitourinary   Musculoskeletal  (+) Arthritis ,    Abdominal Normal abdominal exam  (+)   Peds negative pediatric ROS (+)  Hematology negative hematology ROS (+)   Anesthesia Other Findings Past Medical History: No date: Anxiety No date: Bronchitis No date: Diabetes mellitus without complication (HCC) No date: GERD (gastroesophageal reflux disease) No date: Hyperlipidemia No date: Sleep apnea  Past Surgical History: 2016: BREAST BIOPSY; Left     Comment:  2 areas, ribbon and coil clips, benign No date: BREAST EXCISIONAL BIOPSY; Right     Comment:  age 63, benign, unable to see scar 10/21/1999: CARPAL TUNNEL RELEASE     Comment:  as staated this was the left side, right side was               completed in 2003 02/17/2017: COLONOSCOPY WITH PROPOFOL; N/A     Comment:  Procedure: COLONOSCOPY WITH PROPOFOL;  Surgeon: Midge Minium, MD;  Location: [facility] ENDOSCOPY;  Service: Endoscopy;              Laterality: N/A; No date: COLPOSCOPY W/ BIOPSY / CURETTAGE 10/20/2006:  DILATION AND CURETTAGE OF UTERUS     Comment:  as stated uterine polyps 3004 and 2010: ROTATOR CUFF REPAIR; Right 10/20/1997: TUBAL LIGATION     Reproductive/Obstetrics negative OB ROS                             Anesthesia Physical Anesthesia Plan  ASA: 3  Anesthesia Plan: General   Post-op Pain Management:    Induction: Intravenous  PONV Risk Score and Plan: Propofol infusion and TIVA  Airway Management Planned: Natural Airway and Nasal Cannula  Additional Equipment:   Intra-op Plan:   Post-operative Plan:   Informed Consent: I have reviewed the patients History and Physical, chart, labs and discussed the procedure including the risks, benefits and alternatives for the proposed anesthesia with the patient or authorized representative who has indicated his/her understanding and acceptance.     Dental Advisory Given  Plan Discussed with: CRNA  Anesthesia Plan Comments:        Anesthesia Quick Evaluation

## 2024-01-29 ENCOUNTER — Encounter: Payer: Self-pay | Admitting: Gastroenterology

## 2024-01-29 LAB — SURGICAL PATHOLOGY

## 2024-02-01 ENCOUNTER — Encounter: Payer: Self-pay | Admitting: Gastroenterology

## 2024-02-08 ENCOUNTER — Ambulatory Visit
Admission: RE | Admit: 2024-02-08 | Discharge: 2024-02-08 | Disposition: A | Discharge status: Home / Self Care | Payer: Federal, State, Local not specified - PPO | Source: Ambulatory Visit | Attending: Internal Medicine | Admitting: Internal Medicine

## 2024-02-08 DIAGNOSIS — E782 Mixed hyperlipidemia: Secondary | ICD-10-CM | POA: Insufficient documentation

## 2024-02-16 ENCOUNTER — Encounter: Payer: Self-pay | Admitting: Internal Medicine

## 2024-02-16 DIAGNOSIS — R0602 Shortness of breath: Secondary | ICD-10-CM

## 2024-02-16 DIAGNOSIS — I10 Essential (primary) hypertension: Secondary | ICD-10-CM

## 2024-02-16 DIAGNOSIS — E782 Mixed hyperlipidemia: Secondary | ICD-10-CM

## 2024-02-18 ENCOUNTER — Other Ambulatory Visit: Payer: Self-pay | Admitting: Internal Medicine

## 2024-02-18 DIAGNOSIS — E782 Mixed hyperlipidemia: Secondary | ICD-10-CM

## 2024-02-18 DIAGNOSIS — R0602 Shortness of breath: Secondary | ICD-10-CM

## 2024-02-18 DIAGNOSIS — I2089 Other forms of angina pectoris: Secondary | ICD-10-CM

## 2024-02-24 NOTE — Progress Notes (Signed)
 Chief Complaint  Patient presents with  . Follow-up    HPI  Jacqueline Sparks is a 63 y.o. here for follow up of chronic medical issues  HLD: No acute issues.  Tolerating medications without adverse effects.  Denies any myalgia.  No cp or CVA symptoms.   Primary insomnia: States that the trazodone remains effective w/o adverse effects but tries to avoid use after 10pm.    Tobacco dependence: Still smoking but wants to quit.  Willing to try chantix.  Had tried wellbutrin  w/o success.    ROS Review of systems is unremarkable for any active cardiac, respiratory, GI, GU, hematologic, neurologic, dermatologic, HEENT, or psychiatric symptoms except as noted above.  No fevers, chills, or constitutional symptoms.   Current Outpatient Medications  Medication Sig Dispense Refill  . albuterol  MDI, PROVENTIL , VENTOLIN , PROAIR , HFA 90 mcg/actuation inhaler SMARTSIG:2 Puff(s) By Mouth Every 4 Hours PRN    . atorvastatin  (LIPITOR) 20 MG tablet Take 1 tablet by mouth once daily 90 tablet 1  . blood glucose diagnostic test strip Check blood sugars 4x/day prior to meal and insulin  use. Hold insulin  if CBG <100. Dx E11.69 ONE TOUCH 400 each 1  . blood glucose meter kit as directed (Check blood sugar once daily fasting. Dx E11.69 ONE TOUCH) 1 each 0  . blood-glucose sensor (DEXCOM G7 SENSOR) Devi Use 1 each every 10 (ten) days 3 each 12  . clobetasoL  (TEMOVATE ) 0.05 % ointment Apply topically 2 (two) times daily 30 g 2  . guselkumab (TREMFYA) 100 mg/mL Syrg Inject 100 mg subcutaneously every 8 (eight) weeks 1 mL 5  . lancets Check blood sugars 4x/day prior to meal and insulin  use.  Hold insulin  if CBG <100. Dx E11.69 ONE TOUCH 400 each 1  . lisinopriL (ZESTRIL) 2.5 MG tablet Take 1 tablet by mouth once daily 90 tablet 0  . methotrexate (RHEUMATREX) 2.5 MG tablet Take 7 tablets (17.5 mg total) by mouth every 7 (seven) days All on the same day 28 tablet 2  . tirzepatide (MOUNJARO) 5 mg/0.5 mL pen injector Inject  0.5 mLs (5 mg total) subcutaneously every 7 (seven) days 6 mL 3  . traZODone (DESYREL) 100 MG tablet Take 1 tablet (100 mg total) by mouth at bedtime as needed for Sleep 90 tablet 1  . umeclidinium-vilanteroL (ANORO ELLIPTA ) 62.5-25 mcg/actuation inhaler Inhale 1 inhalation into the lungs once daily (Patient taking differently: Inhale 1 Puff into the lungs once daily as needed) 30 each 3  . aspirin 81 MG EC tablet Take 81 mg by mouth once daily as needed for Pain    . varenicline tartrate (CHANTIX STARTING MONTH PAK) tablet Follow package directions. 53 tablet 0   No current facility-administered medications for this visit.    Allergies as of 02/24/2024 - Reviewed 02/24/2024  Allergen Reaction Noted  . Metformin Other (See Comments) 09/23/2021    Patient Active Problem List  Diagnosis  . Type 2 diabetes mellitus with hyperlipidemia (A1c 6.7% - 02/11/24) - followed by [facility]  . Mixed hyperlipidemia (LDL 76 - 02/11/24)  . Tobacco dependence (1 ppd; started at age 54)  . Primary insomnia  . Psoriasis  . Psoriatic arthritis (CMS-HCC) - followed by Dr. Tobie [facility])  . Long-term use of immunosuppressant medication  . Onychomycosis  . Coronary artery disease involving native coronary artery of native heart without angina pectoris (Cardiac CT 02/08/24- CAC 945, 99th percentile) - followed by Dr. Florencio    Past Medical History:  Diagnosis Date  .  COPD (chronic obstructive pulmonary disease) (CMS/HHS-HCC)    MILD  . DM (diabetes mellitus) (CMS/HHS-HCC) diagnosis 2005  . History of noncompliance with medical treatment   . HTN (hypertension)   . Hyperlipidemia   . Obesity   . OSA (obstructive sleep apnea)    MILD  . Psoriasis   . Tobacco dependence     Past Surgical History:  Procedure Laterality Date  . COLONOSCOPY N/A 02/17/2017   Dr. JONETTA. Wohl @ [facility] - Adenomatous Polyps i.e. Sessile Serrated Adenoma  . BTL    . ENDOSCOPIC CARPAL TUNNEL RELEASE    . GANGLION CYST  REMOVAL    . RIGHT ROTATOR CUFF SURGERY      Vitals:   02/24/24 1106  BP: 130/62  Pulse: 72  SpO2: 96%  Weight: 61.1 kg (134 lb 12.8 oz)  Height: 160 cm (5' 3)  PainSc: 0-No pain   Body mass index is 23.88 kg/m.  Exam  General. Well appearing; NAD; VS reviewed     Eyes. Sclera and conjunctiva clear; Vision grossly intact; extraocular movements intact Neck. Supple.  Lungs. Respirations unlabored; clear to auscultation bilaterally Cardiovascular. Heart regular rate and rhythm without murmurs, gallops, or rubs Abdomen. Soft; non tender; non distended; no masses or organomegaly Extremities. no edema Skin. Normal color and turgor Neurologic. Alert and oriented x3; CN 2-12 grossly intact; no focal deficits  Assessment and Plan  1. Mixed hyperlipidemia (LDL 76 - 02/11/24) Well controlled.  LFTs wnls (01/2024). Continue with current regimen.  Counseled on mediterranean diet and aerobic exercise. -     Comprehensive Metabolic Panel (CMP); Future -     Lipid Panel w/calc LDL; Future  2. Primary insomnia Stable.  Cont w/ trazodone prn.    3. Tobacco dependence (1 ppd; started at age 28) Unchanged.  Smoking cessation counseling provided.  Rx: Chantix starter pack.  Call back after 2 wks on medication and give us  update on whether to prescribe maintenance pack x 3.    Other orders -     Follow up in Primary Care -     varenicline tartrate (CHANTIX STARTING MONTH PAK) tablet; Follow package directions. -     Follow up in Primary Care; Future  F/u 6 months for reck; labs prior  ALDA CARPEN, MD

## 2024-03-04 ENCOUNTER — Telehealth (HOSPITAL_COMMUNITY): Payer: Self-pay | Admitting: *Deleted

## 2024-03-04 MED ORDER — METOPROLOL TARTRATE 100 MG PO TABS: ORAL_TABLET | ORAL | 0 refills | Status: AC

## 2024-03-04 NOTE — Telephone Encounter (Signed)
 Reaching out to patient to offer assistance regarding upcoming cardiac imaging study; pt verbalizes understanding of appt date/time, parking situation and where to check in, pre-test NPO status and medications ordered, and verified current allergies; name and call back number provided for further questions should they arise Johney Frame RN Navigator Cardiac Imaging [facility] [phone] office [phone] cell

## 2024-03-07 ENCOUNTER — Ambulatory Visit: Admission: RE | Admit: 2024-03-07 | Source: Ambulatory Visit

## 2024-03-09 ENCOUNTER — Telehealth (HOSPITAL_COMMUNITY): Payer: Self-pay | Admitting: *Deleted

## 2024-03-09 NOTE — Telephone Encounter (Signed)

## 2024-03-10 ENCOUNTER — Ambulatory Visit
Admission: RE | Admit: 2024-03-10 | Discharge: 2024-03-10 | Disposition: A | Discharge status: Home / Self Care | Source: Ambulatory Visit | Attending: Internal Medicine | Admitting: Internal Medicine

## 2024-03-10 DIAGNOSIS — I2089 Other forms of angina pectoris: Secondary | ICD-10-CM | POA: Insufficient documentation

## 2024-03-10 DIAGNOSIS — E782 Mixed hyperlipidemia: Secondary | ICD-10-CM | POA: Insufficient documentation

## 2024-03-10 DIAGNOSIS — R0602 Shortness of breath: Secondary | ICD-10-CM | POA: Diagnosis present

## 2024-03-10 MED ORDER — IOHEXOL 350 MG/ML SOLN
80.0000 mL | Freq: Once | INTRAVENOUS | Status: AC | PRN
  Administered 2024-03-10: 80 mL via INTRAVENOUS

## 2024-03-10 MED ORDER — METOPROLOL TARTRATE 5 MG/5ML IV SOLN: 10.0000 mg | Freq: Once | INTRAVENOUS | Status: DC | PRN

## 2024-03-10 MED ORDER — NITROGLYCERIN 0.4 MG SL SUBL
0.8000 mg | SUBLINGUAL_TABLET | Freq: Once | SUBLINGUAL | Status: AC
  Administered 2024-03-10: 0.8 mg via SUBLINGUAL

## 2024-03-10 MED ORDER — DILTIAZEM HCL 25 MG/5ML IV SOLN: 10.0000 mg | INTRAVENOUS | Status: DC | PRN

## 2024-03-10 NOTE — Progress Notes (Signed)
Patient tolerated procedure well. W/C to lobby.  Ambulate w/o difficulty. Denies light headedness or being dizzy. Encouraged to drink extra water today and reasoning explained. Verbalized understanding. All questions answered. ABC intact. No further needs. Discharge from procedure area w/o issues.

## 2024-04-08 ENCOUNTER — Encounter: Payer: Self-pay | Admitting: Internal Medicine

## 2024-06-28 ENCOUNTER — Ambulatory Visit: Payer: Federal, State, Local not specified - PPO | Admitting: Dermatology

## 2024-09-12 ENCOUNTER — Ambulatory Visit

## 2024-09-12 DIAGNOSIS — K635 Polyp of colon: Secondary | ICD-10-CM | POA: Diagnosis not present

## 2024-09-12 DIAGNOSIS — Z860101 Personal history of adenomatous and serrated colon polyps: Secondary | ICD-10-CM | POA: Diagnosis not present

## 2024-09-12 DIAGNOSIS — Z09 Encounter for follow-up examination after completed treatment for conditions other than malignant neoplasm: Secondary | ICD-10-CM | POA: Diagnosis present
